# Patient Record
Sex: Male | Born: 1975 | Race: White | Hispanic: No | Marital: Single | Smoking: Current every day smoker
Health system: Southern US, Community
[De-identification: ages and names within clinical notes are randomized; demographics above are authoritative.]

## PROBLEM LIST (undated history)

## (undated) DIAGNOSIS — F419 Anxiety disorder, unspecified: Secondary | ICD-10-CM

## (undated) DIAGNOSIS — F329 Major depressive disorder, single episode, unspecified: Secondary | ICD-10-CM

## (undated) DIAGNOSIS — M797 Fibromyalgia: Secondary | ICD-10-CM

## (undated) DIAGNOSIS — M1990 Unspecified osteoarthritis, unspecified site: Secondary | ICD-10-CM

## (undated) DIAGNOSIS — R55 Syncope and collapse: Secondary | ICD-10-CM

## (undated) DIAGNOSIS — M4802 Spinal stenosis, cervical region: Secondary | ICD-10-CM

## (undated) DIAGNOSIS — R51 Headache: Secondary | ICD-10-CM

## (undated) DIAGNOSIS — K859 Acute pancreatitis without necrosis or infection, unspecified: Secondary | ICD-10-CM

## (undated) DIAGNOSIS — K469 Unspecified abdominal hernia without obstruction or gangrene: Secondary | ICD-10-CM

## (undated) DIAGNOSIS — F32A Depression, unspecified: Secondary | ICD-10-CM

## (undated) DIAGNOSIS — R519 Headache, unspecified: Secondary | ICD-10-CM

## (undated) HISTORY — PX: OTHER SURGICAL HISTORY: SHX169

## (undated) HISTORY — DX: Unspecified abdominal hernia without obstruction or gangrene: K46.9

## (undated) HISTORY — PX: INGUINAL HERNIA REPAIR: SUR1180

---

## 2000-03-17 ENCOUNTER — Encounter: Payer: Self-pay | Admitting: Occupational Medicine

## 2000-03-17 ENCOUNTER — Encounter: Admission: RE | Admit: 2000-03-17 | Discharge: 2000-03-17 | Payer: Self-pay | Admitting: Occupational Medicine

## 2002-10-03 ENCOUNTER — Emergency Department (HOSPITAL_COMMUNITY): Admission: EM | Admit: 2002-10-03 | Discharge: 2002-10-04 | Payer: Self-pay | Admitting: Emergency Medicine

## 2002-10-04 ENCOUNTER — Encounter: Payer: Self-pay | Admitting: Emergency Medicine

## 2003-07-03 ENCOUNTER — Emergency Department (HOSPITAL_COMMUNITY): Admission: EM | Admit: 2003-07-03 | Discharge: 2003-07-04 | Payer: Self-pay | Admitting: Emergency Medicine

## 2003-07-04 ENCOUNTER — Ambulatory Visit (HOSPITAL_COMMUNITY): Admission: RE | Admit: 2003-07-04 | Discharge: 2003-07-04 | Payer: Self-pay | Admitting: Emergency Medicine

## 2003-10-22 ENCOUNTER — Emergency Department (HOSPITAL_COMMUNITY): Admission: EM | Admit: 2003-10-22 | Discharge: 2003-10-23 | Payer: Self-pay | Admitting: Emergency Medicine

## 2003-10-23 ENCOUNTER — Emergency Department (HOSPITAL_COMMUNITY): Admission: EM | Admit: 2003-10-23 | Discharge: 2003-10-24 | Payer: Self-pay | Admitting: Emergency Medicine

## 2003-10-25 ENCOUNTER — Emergency Department (HOSPITAL_COMMUNITY): Admission: EM | Admit: 2003-10-25 | Discharge: 2003-10-25 | Payer: Self-pay | Admitting: Emergency Medicine

## 2003-10-28 ENCOUNTER — Emergency Department (HOSPITAL_COMMUNITY): Admission: EM | Admit: 2003-10-28 | Discharge: 2003-10-29 | Payer: Self-pay | Admitting: Emergency Medicine

## 2003-12-09 ENCOUNTER — Encounter: Admission: RE | Admit: 2003-12-09 | Discharge: 2003-12-09 | Payer: Self-pay | Admitting: Family Medicine

## 2003-12-19 ENCOUNTER — Ambulatory Visit (HOSPITAL_COMMUNITY): Admission: RE | Admit: 2003-12-19 | Discharge: 2003-12-19 | Payer: Self-pay | Admitting: Family Medicine

## 2003-12-22 ENCOUNTER — Emergency Department (HOSPITAL_COMMUNITY): Admission: EM | Admit: 2003-12-22 | Discharge: 2003-12-22 | Payer: Self-pay | Admitting: Emergency Medicine

## 2003-12-30 ENCOUNTER — Encounter: Admission: RE | Admit: 2003-12-30 | Discharge: 2003-12-30 | Payer: Self-pay | Admitting: Family Medicine

## 2004-01-02 ENCOUNTER — Emergency Department (HOSPITAL_COMMUNITY): Admission: EM | Admit: 2004-01-02 | Discharge: 2004-01-03 | Payer: Self-pay | Admitting: *Deleted

## 2004-01-06 ENCOUNTER — Ambulatory Visit (HOSPITAL_COMMUNITY): Admission: RE | Admit: 2004-01-06 | Discharge: 2004-01-06 | Payer: Self-pay | Admitting: Internal Medicine

## 2004-01-11 ENCOUNTER — Ambulatory Visit: Payer: Self-pay | Admitting: Family Medicine

## 2004-03-23 ENCOUNTER — Ambulatory Visit: Payer: Self-pay | Admitting: Family Medicine

## 2004-04-02 ENCOUNTER — Ambulatory Visit: Payer: Self-pay | Admitting: Internal Medicine

## 2004-04-10 ENCOUNTER — Ambulatory Visit: Payer: Self-pay | Admitting: Internal Medicine

## 2004-04-12 ENCOUNTER — Ambulatory Visit (HOSPITAL_COMMUNITY): Admission: RE | Admit: 2004-04-12 | Discharge: 2004-04-12 | Payer: Self-pay | Admitting: Internal Medicine

## 2004-04-23 ENCOUNTER — Ambulatory Visit (HOSPITAL_COMMUNITY): Admission: RE | Admit: 2004-04-23 | Discharge: 2004-04-23 | Payer: Self-pay | Admitting: Internal Medicine

## 2004-04-23 ENCOUNTER — Ambulatory Visit: Payer: Self-pay | Admitting: Internal Medicine

## 2004-06-14 ENCOUNTER — Ambulatory Visit: Payer: Self-pay | Admitting: Family Medicine

## 2004-10-16 ENCOUNTER — Ambulatory Visit: Payer: Self-pay | Admitting: Family Medicine

## 2004-10-22 ENCOUNTER — Ambulatory Visit: Payer: Self-pay | Admitting: Internal Medicine

## 2005-02-12 ENCOUNTER — Ambulatory Visit: Payer: Self-pay | Admitting: Internal Medicine

## 2005-05-24 ENCOUNTER — Ambulatory Visit: Payer: Self-pay | Admitting: Family Medicine

## 2005-06-11 ENCOUNTER — Ambulatory Visit: Payer: Self-pay | Admitting: Family Medicine

## 2005-06-13 ENCOUNTER — Ambulatory Visit: Payer: Self-pay | Admitting: Cardiology

## 2006-01-02 ENCOUNTER — Encounter: Admission: RE | Admit: 2006-01-02 | Discharge: 2006-01-02 | Payer: Self-pay | Admitting: Family Medicine

## 2006-01-02 ENCOUNTER — Ambulatory Visit: Payer: Self-pay | Admitting: Family Medicine

## 2006-01-08 ENCOUNTER — Encounter: Admission: RE | Admit: 2006-01-08 | Discharge: 2006-01-08 | Payer: Self-pay | Admitting: Family Medicine

## 2006-02-11 ENCOUNTER — Ambulatory Visit: Payer: Self-pay | Admitting: Family Medicine

## 2006-03-28 ENCOUNTER — Encounter: Payer: Self-pay | Admitting: Family Medicine

## 2006-06-16 ENCOUNTER — Ambulatory Visit: Payer: Self-pay | Admitting: Family Medicine

## 2006-06-16 LAB — CONVERTED CEMR LAB
BUN: 8 mg/dL (ref 6–23)
Basophils Absolute: 0 10*3/uL (ref 0.0–0.1)
Basophils Relative: 0.4 % (ref 0.0–1.0)
CO2: 33 meq/L — ABNORMAL HIGH (ref 19–32)
Creatinine, Ser: 1 mg/dL (ref 0.4–1.5)
Free T4: 0.8 ng/dL (ref 0.6–1.6)
GFR calc Af Amer: 114 mL/min
GFR calc non Af Amer: 94 mL/min
MCHC: 33.9 g/dL (ref 30.0–36.0)
Neutrophils Relative %: 53 % (ref 43.0–77.0)
Platelets: 203 10*3/uL (ref 150–400)
RBC: 5.06 M/uL (ref 4.22–5.81)
RDW: 11.9 % (ref 11.5–14.6)
Sodium: 143 meq/L (ref 135–145)
WBC: 6.3 10*3/uL (ref 4.5–10.5)

## 2006-07-01 DIAGNOSIS — Z8669 Personal history of other diseases of the nervous system and sense organs: Secondary | ICD-10-CM

## 2006-08-12 ENCOUNTER — Ambulatory Visit: Payer: Self-pay | Admitting: Family Medicine

## 2006-08-12 ENCOUNTER — Encounter: Admission: RE | Admit: 2006-08-12 | Discharge: 2006-08-12 | Payer: Self-pay | Admitting: Family Medicine

## 2006-08-12 DIAGNOSIS — R5383 Other fatigue: Secondary | ICD-10-CM

## 2006-08-12 DIAGNOSIS — R059 Cough, unspecified: Secondary | ICD-10-CM | POA: Insufficient documentation

## 2006-08-12 DIAGNOSIS — R05 Cough: Secondary | ICD-10-CM

## 2006-08-12 DIAGNOSIS — R5381 Other malaise: Secondary | ICD-10-CM

## 2006-08-12 DIAGNOSIS — F329 Major depressive disorder, single episode, unspecified: Secondary | ICD-10-CM

## 2006-08-12 DIAGNOSIS — F32A Depression, unspecified: Secondary | ICD-10-CM | POA: Insufficient documentation

## 2006-08-13 ENCOUNTER — Encounter (INDEPENDENT_AMBULATORY_CARE_PROVIDER_SITE_OTHER): Payer: Self-pay | Admitting: *Deleted

## 2006-08-13 LAB — CONVERTED CEMR LAB
Basophils Absolute: 0 10*3/uL (ref 0.0–0.1)
Chloride: 106 meq/L (ref 96–112)
Eosinophils Absolute: 0.2 10*3/uL (ref 0.0–0.6)
Eosinophils Relative: 2.4 % (ref 0.0–5.0)
HCT: 46.6 % (ref 39.0–52.0)
Hemoglobin: 16.1 g/dL (ref 13.0–17.0)
MCV: 90.7 fL (ref 78.0–100.0)
Neutro Abs: 3.4 10*3/uL (ref 1.4–7.7)
Neutrophils Relative %: 50.1 % (ref 43.0–77.0)
Platelets: 237 10*3/uL (ref 150–400)
Potassium: 4.2 meq/L (ref 3.5–5.1)
Sodium: 141 meq/L (ref 135–145)
Testosterone: 699.83 ng/dL (ref 350.00–890)

## 2006-09-25 ENCOUNTER — Encounter: Payer: Self-pay | Admitting: Family Medicine

## 2006-11-24 ENCOUNTER — Encounter: Payer: Self-pay | Admitting: Family Medicine

## 2007-04-10 ENCOUNTER — Ambulatory Visit: Payer: Self-pay | Admitting: Family Medicine

## 2007-04-10 DIAGNOSIS — F1921 Other psychoactive substance dependence, in remission: Secondary | ICD-10-CM

## 2007-07-10 ENCOUNTER — Emergency Department (HOSPITAL_COMMUNITY): Admission: EM | Admit: 2007-07-10 | Discharge: 2007-07-11 | Payer: Self-pay | Admitting: Emergency Medicine

## 2007-08-10 ENCOUNTER — Emergency Department (HOSPITAL_COMMUNITY): Admission: EM | Admit: 2007-08-10 | Discharge: 2007-08-10 | Payer: Self-pay | Admitting: Emergency Medicine

## 2007-08-30 ENCOUNTER — Emergency Department (HOSPITAL_COMMUNITY): Admission: EM | Admit: 2007-08-30 | Discharge: 2007-08-30 | Payer: Self-pay | Admitting: Emergency Medicine

## 2007-09-01 ENCOUNTER — Emergency Department (HOSPITAL_COMMUNITY): Admission: EM | Admit: 2007-09-01 | Discharge: 2007-09-01 | Payer: Self-pay | Admitting: Emergency Medicine

## 2007-11-08 ENCOUNTER — Emergency Department (HOSPITAL_COMMUNITY): Admission: EM | Admit: 2007-11-08 | Discharge: 2007-11-08 | Payer: Self-pay | Admitting: Emergency Medicine

## 2007-11-19 ENCOUNTER — Ambulatory Visit: Payer: Self-pay | Admitting: Family Medicine

## 2007-11-19 DIAGNOSIS — F411 Generalized anxiety disorder: Secondary | ICD-10-CM | POA: Insufficient documentation

## 2007-11-19 DIAGNOSIS — M542 Cervicalgia: Secondary | ICD-10-CM

## 2007-11-30 ENCOUNTER — Telehealth (INDEPENDENT_AMBULATORY_CARE_PROVIDER_SITE_OTHER): Payer: Self-pay | Admitting: *Deleted

## 2007-12-07 ENCOUNTER — Telehealth (INDEPENDENT_AMBULATORY_CARE_PROVIDER_SITE_OTHER): Payer: Self-pay | Admitting: *Deleted

## 2007-12-09 ENCOUNTER — Telehealth: Payer: Self-pay | Admitting: Family Medicine

## 2008-01-18 ENCOUNTER — Telehealth: Payer: Self-pay | Admitting: Family Medicine

## 2008-01-27 ENCOUNTER — Telehealth: Payer: Self-pay | Admitting: Family Medicine

## 2008-01-27 ENCOUNTER — Ambulatory Visit: Payer: Self-pay | Admitting: Family Medicine

## 2008-02-22 ENCOUNTER — Telehealth (INDEPENDENT_AMBULATORY_CARE_PROVIDER_SITE_OTHER): Payer: Self-pay | Admitting: *Deleted

## 2008-03-09 ENCOUNTER — Ambulatory Visit: Payer: Self-pay | Admitting: Family Medicine

## 2008-03-09 ENCOUNTER — Encounter: Payer: Self-pay | Admitting: Family Medicine

## 2008-03-09 DIAGNOSIS — M279 Disease of jaws, unspecified: Secondary | ICD-10-CM | POA: Insufficient documentation

## 2008-03-10 ENCOUNTER — Telehealth: Payer: Self-pay | Admitting: Family Medicine

## 2008-03-31 ENCOUNTER — Telehealth: Payer: Self-pay | Admitting: Family Medicine

## 2008-04-21 ENCOUNTER — Ambulatory Visit: Payer: Self-pay | Admitting: Family Medicine

## 2008-04-21 ENCOUNTER — Telehealth (INDEPENDENT_AMBULATORY_CARE_PROVIDER_SITE_OTHER): Payer: Self-pay | Admitting: *Deleted

## 2008-04-28 ENCOUNTER — Telehealth: Payer: Self-pay | Admitting: Family Medicine

## 2008-05-02 ENCOUNTER — Ambulatory Visit: Payer: Self-pay | Admitting: Family Medicine

## 2008-05-02 DIAGNOSIS — M4802 Spinal stenosis, cervical region: Secondary | ICD-10-CM

## 2008-05-06 ENCOUNTER — Telehealth (INDEPENDENT_AMBULATORY_CARE_PROVIDER_SITE_OTHER): Payer: Self-pay | Admitting: *Deleted

## 2008-05-26 ENCOUNTER — Emergency Department (HOSPITAL_COMMUNITY): Admission: EM | Admit: 2008-05-26 | Discharge: 2008-05-26 | Payer: Self-pay | Admitting: Emergency Medicine

## 2008-05-26 ENCOUNTER — Telehealth (INDEPENDENT_AMBULATORY_CARE_PROVIDER_SITE_OTHER): Payer: Self-pay | Admitting: *Deleted

## 2008-06-07 ENCOUNTER — Telehealth (INDEPENDENT_AMBULATORY_CARE_PROVIDER_SITE_OTHER): Payer: Self-pay | Admitting: *Deleted

## 2008-06-20 ENCOUNTER — Telehealth: Payer: Self-pay | Admitting: Family Medicine

## 2008-06-29 ENCOUNTER — Ambulatory Visit: Payer: Self-pay | Admitting: Family Medicine

## 2008-07-13 ENCOUNTER — Telehealth (INDEPENDENT_AMBULATORY_CARE_PROVIDER_SITE_OTHER): Payer: Self-pay | Admitting: *Deleted

## 2008-07-25 ENCOUNTER — Telehealth (INDEPENDENT_AMBULATORY_CARE_PROVIDER_SITE_OTHER): Payer: Self-pay | Admitting: *Deleted

## 2008-08-17 ENCOUNTER — Ambulatory Visit: Payer: Self-pay | Admitting: Family Medicine

## 2008-08-17 ENCOUNTER — Telehealth (INDEPENDENT_AMBULATORY_CARE_PROVIDER_SITE_OTHER): Payer: Self-pay | Admitting: *Deleted

## 2008-08-18 ENCOUNTER — Telehealth (INDEPENDENT_AMBULATORY_CARE_PROVIDER_SITE_OTHER): Payer: Self-pay | Admitting: *Deleted

## 2008-09-09 ENCOUNTER — Emergency Department: Payer: Self-pay | Admitting: Emergency Medicine

## 2008-09-16 ENCOUNTER — Telehealth (INDEPENDENT_AMBULATORY_CARE_PROVIDER_SITE_OTHER): Payer: Self-pay | Admitting: *Deleted

## 2008-10-03 ENCOUNTER — Telehealth: Payer: Self-pay | Admitting: Family Medicine

## 2008-10-05 ENCOUNTER — Telehealth: Payer: Self-pay | Admitting: Family Medicine

## 2008-10-07 ENCOUNTER — Ambulatory Visit: Payer: Self-pay | Admitting: Family Medicine

## 2008-10-07 DIAGNOSIS — N509 Disorder of male genital organs, unspecified: Secondary | ICD-10-CM | POA: Insufficient documentation

## 2008-10-07 DIAGNOSIS — R3911 Hesitancy of micturition: Secondary | ICD-10-CM

## 2008-10-07 LAB — CONVERTED CEMR LAB
Bilirubin Urine: NEGATIVE
Glucose, Urine, Semiquant: NEGATIVE
Nitrite: NEGATIVE
Protein, U semiquant: NEGATIVE
pH: 5

## 2008-10-08 ENCOUNTER — Encounter: Payer: Self-pay | Admitting: Family Medicine

## 2008-10-09 LAB — CONVERTED CEMR LAB: Chlamydia, Swab/Urine, PCR: NEGATIVE

## 2008-10-10 ENCOUNTER — Encounter: Payer: Self-pay | Admitting: Family Medicine

## 2008-10-11 ENCOUNTER — Telehealth: Payer: Self-pay | Admitting: Family Medicine

## 2008-10-11 DIAGNOSIS — R109 Unspecified abdominal pain: Secondary | ICD-10-CM

## 2008-10-14 ENCOUNTER — Ambulatory Visit: Payer: Self-pay | Admitting: Cardiology

## 2008-10-14 ENCOUNTER — Telehealth (INDEPENDENT_AMBULATORY_CARE_PROVIDER_SITE_OTHER): Payer: Self-pay | Admitting: *Deleted

## 2008-10-15 ENCOUNTER — Emergency Department: Payer: Self-pay | Admitting: Internal Medicine

## 2008-10-18 ENCOUNTER — Telehealth (INDEPENDENT_AMBULATORY_CARE_PROVIDER_SITE_OTHER): Payer: Self-pay | Admitting: *Deleted

## 2008-10-20 ENCOUNTER — Telehealth (INDEPENDENT_AMBULATORY_CARE_PROVIDER_SITE_OTHER): Payer: Self-pay | Admitting: *Deleted

## 2008-10-25 ENCOUNTER — Telehealth (INDEPENDENT_AMBULATORY_CARE_PROVIDER_SITE_OTHER): Payer: Self-pay | Admitting: *Deleted

## 2008-11-02 ENCOUNTER — Ambulatory Visit: Payer: Self-pay | Admitting: Family Medicine

## 2008-11-02 DIAGNOSIS — Z8619 Personal history of other infectious and parasitic diseases: Secondary | ICD-10-CM

## 2008-11-03 LAB — CONVERTED CEMR LAB
Eosinophils Absolute: 0.3 10*3/uL (ref 0.0–0.7)
Eosinophils Relative: 3.4 % (ref 0.0–5.0)
HCT: 45.4 % (ref 39.0–52.0)
Lymphocytes Relative: 42 % (ref 12.0–46.0)
Lymphs Abs: 3.3 10*3/uL (ref 0.7–4.0)
MCHC: 34.9 g/dL (ref 30.0–36.0)
Monocytes Absolute: 0.7 10*3/uL (ref 0.1–1.0)
Neutrophils Relative %: 45.9 % (ref 43.0–77.0)
RBC: 5.01 M/uL (ref 4.22–5.81)
RDW: 12.3 % (ref 11.5–14.6)

## 2008-11-04 ENCOUNTER — Encounter (INDEPENDENT_AMBULATORY_CARE_PROVIDER_SITE_OTHER): Payer: Self-pay | Admitting: *Deleted

## 2008-11-09 ENCOUNTER — Telehealth: Payer: Self-pay | Admitting: Family Medicine

## 2008-11-12 ENCOUNTER — Encounter: Payer: Self-pay | Admitting: Family Medicine

## 2008-11-13 ENCOUNTER — Encounter: Payer: Self-pay | Admitting: Family Medicine

## 2008-11-15 ENCOUNTER — Telehealth (INDEPENDENT_AMBULATORY_CARE_PROVIDER_SITE_OTHER): Payer: Self-pay | Admitting: *Deleted

## 2008-11-15 ENCOUNTER — Telehealth: Payer: Self-pay | Admitting: Family Medicine

## 2008-12-01 ENCOUNTER — Telehealth: Payer: Self-pay | Admitting: Family Medicine

## 2008-12-05 ENCOUNTER — Telehealth: Payer: Self-pay | Admitting: Family Medicine

## 2008-12-12 ENCOUNTER — Encounter: Payer: Self-pay | Admitting: Family Medicine

## 2008-12-26 ENCOUNTER — Ambulatory Visit: Payer: Self-pay | Admitting: Family Medicine

## 2009-01-23 ENCOUNTER — Telehealth: Payer: Self-pay | Admitting: Family Medicine

## 2009-02-07 ENCOUNTER — Emergency Department (HOSPITAL_COMMUNITY): Admission: EM | Admit: 2009-02-07 | Discharge: 2009-02-07 | Payer: Self-pay | Admitting: Emergency Medicine

## 2009-02-07 ENCOUNTER — Ambulatory Visit: Payer: Self-pay | Admitting: Family Medicine

## 2009-02-07 DIAGNOSIS — S61409A Unspecified open wound of unspecified hand, initial encounter: Secondary | ICD-10-CM | POA: Insufficient documentation

## 2009-02-08 ENCOUNTER — Telehealth (INDEPENDENT_AMBULATORY_CARE_PROVIDER_SITE_OTHER): Payer: Self-pay | Admitting: *Deleted

## 2009-02-09 ENCOUNTER — Encounter: Payer: Self-pay | Admitting: Family Medicine

## 2009-02-10 ENCOUNTER — Telehealth: Payer: Self-pay | Admitting: Family Medicine

## 2009-02-13 ENCOUNTER — Ambulatory Visit (HOSPITAL_COMMUNITY): Admission: RE | Admit: 2009-02-13 | Discharge: 2009-02-13 | Payer: Self-pay | Admitting: Orthopedic Surgery

## 2009-02-15 ENCOUNTER — Telehealth: Payer: Self-pay | Admitting: Family Medicine

## 2009-02-16 ENCOUNTER — Telehealth (INDEPENDENT_AMBULATORY_CARE_PROVIDER_SITE_OTHER): Payer: Self-pay | Admitting: *Deleted

## 2009-02-21 ENCOUNTER — Encounter: Admission: RE | Admit: 2009-02-21 | Discharge: 2009-03-08 | Payer: Self-pay | Admitting: Orthopedic Surgery

## 2009-03-06 ENCOUNTER — Ambulatory Visit: Payer: Self-pay | Admitting: Internal Medicine

## 2009-03-13 ENCOUNTER — Ambulatory Visit: Payer: Self-pay | Admitting: Family Medicine

## 2009-03-13 DIAGNOSIS — F17200 Nicotine dependence, unspecified, uncomplicated: Secondary | ICD-10-CM

## 2009-03-14 ENCOUNTER — Encounter: Payer: Self-pay | Admitting: Family Medicine

## 2009-03-14 ENCOUNTER — Telehealth: Payer: Self-pay | Admitting: Family Medicine

## 2009-03-27 ENCOUNTER — Telehealth: Payer: Self-pay | Admitting: Family Medicine

## 2009-03-29 ENCOUNTER — Ambulatory Visit: Payer: Self-pay | Admitting: Family Medicine

## 2009-03-29 ENCOUNTER — Telehealth: Payer: Self-pay | Admitting: Family Medicine

## 2009-04-07 ENCOUNTER — Telehealth (INDEPENDENT_AMBULATORY_CARE_PROVIDER_SITE_OTHER): Payer: Self-pay | Admitting: *Deleted

## 2009-04-09 ENCOUNTER — Emergency Department (HOSPITAL_COMMUNITY): Admission: EM | Admit: 2009-04-09 | Discharge: 2009-04-09 | Payer: Self-pay | Admitting: Emergency Medicine

## 2009-04-11 ENCOUNTER — Telehealth: Payer: Self-pay | Admitting: Family Medicine

## 2009-04-21 ENCOUNTER — Emergency Department (HOSPITAL_COMMUNITY): Admission: EM | Admit: 2009-04-21 | Discharge: 2009-04-21 | Payer: Self-pay | Admitting: Family Medicine

## 2009-04-23 ENCOUNTER — Emergency Department (HOSPITAL_COMMUNITY): Admission: EM | Admit: 2009-04-23 | Discharge: 2009-04-23 | Payer: Self-pay | Admitting: Emergency Medicine

## 2009-04-25 ENCOUNTER — Ambulatory Visit: Payer: Self-pay | Admitting: Family Medicine

## 2009-05-05 ENCOUNTER — Telehealth: Payer: Self-pay | Admitting: Family Medicine

## 2009-05-08 ENCOUNTER — Ambulatory Visit: Payer: Self-pay | Admitting: Family Medicine

## 2009-05-08 ENCOUNTER — Telehealth: Payer: Self-pay | Admitting: Family Medicine

## 2009-05-08 DIAGNOSIS — Z9119 Patient's noncompliance with other medical treatment and regimen: Secondary | ICD-10-CM

## 2009-05-09 ENCOUNTER — Telehealth: Payer: Self-pay | Admitting: Family Medicine

## 2009-05-10 ENCOUNTER — Telehealth: Payer: Self-pay | Admitting: Family Medicine

## 2009-05-11 ENCOUNTER — Encounter: Payer: Self-pay | Admitting: Family Medicine

## 2009-05-22 ENCOUNTER — Telehealth: Payer: Self-pay | Admitting: Family Medicine

## 2009-06-02 ENCOUNTER — Telehealth (INDEPENDENT_AMBULATORY_CARE_PROVIDER_SITE_OTHER): Payer: Self-pay | Admitting: *Deleted

## 2009-06-05 ENCOUNTER — Telehealth: Payer: Self-pay | Admitting: Family Medicine

## 2009-06-05 ENCOUNTER — Telehealth (INDEPENDENT_AMBULATORY_CARE_PROVIDER_SITE_OTHER): Payer: Self-pay | Admitting: *Deleted

## 2009-06-05 ENCOUNTER — Encounter: Payer: Self-pay | Admitting: Family Medicine

## 2009-06-08 ENCOUNTER — Emergency Department (HOSPITAL_COMMUNITY): Admission: EM | Admit: 2009-06-08 | Discharge: 2009-06-08 | Payer: Self-pay | Admitting: Family Medicine

## 2009-06-09 ENCOUNTER — Emergency Department: Payer: Self-pay | Admitting: Emergency Medicine

## 2009-06-19 ENCOUNTER — Emergency Department: Payer: Self-pay | Admitting: Emergency Medicine

## 2009-07-03 ENCOUNTER — Emergency Department (HOSPITAL_COMMUNITY): Admission: EM | Admit: 2009-07-03 | Discharge: 2009-07-03 | Payer: Self-pay | Admitting: Emergency Medicine

## 2009-07-07 ENCOUNTER — Emergency Department (HOSPITAL_COMMUNITY): Admission: EM | Admit: 2009-07-07 | Discharge: 2009-07-07 | Payer: Self-pay | Admitting: Emergency Medicine

## 2009-09-04 ENCOUNTER — Emergency Department: Payer: Self-pay | Admitting: Emergency Medicine

## 2009-09-18 ENCOUNTER — Emergency Department: Payer: Self-pay | Admitting: Emergency Medicine

## 2009-09-24 ENCOUNTER — Emergency Department: Payer: Self-pay | Admitting: Emergency Medicine

## 2009-09-28 ENCOUNTER — Emergency Department: Payer: Self-pay | Admitting: Emergency Medicine

## 2009-10-25 ENCOUNTER — Emergency Department: Payer: Self-pay | Admitting: Emergency Medicine

## 2009-11-16 ENCOUNTER — Emergency Department: Payer: Self-pay | Admitting: Emergency Medicine

## 2009-11-17 ENCOUNTER — Emergency Department (HOSPITAL_COMMUNITY)
Admission: EM | Admit: 2009-11-17 | Discharge: 2009-11-17 | Payer: Self-pay | Source: Home / Self Care | Admitting: Emergency Medicine

## 2009-11-26 ENCOUNTER — Emergency Department: Payer: Self-pay | Admitting: Emergency Medicine

## 2009-12-10 ENCOUNTER — Emergency Department: Payer: Self-pay | Admitting: Emergency Medicine

## 2009-12-23 ENCOUNTER — Emergency Department: Payer: Self-pay | Admitting: Unknown Physician Specialty

## 2009-12-25 ENCOUNTER — Emergency Department: Payer: Self-pay | Admitting: Emergency Medicine

## 2010-02-07 ENCOUNTER — Emergency Department: Payer: Self-pay | Admitting: Emergency Medicine

## 2010-03-19 ENCOUNTER — Emergency Department (HOSPITAL_COMMUNITY)
Admission: EM | Admit: 2010-03-19 | Discharge: 2010-03-19 | Payer: Self-pay | Source: Home / Self Care | Admitting: Emergency Medicine

## 2010-03-26 LAB — COMPREHENSIVE METABOLIC PANEL
ALT: 15 U/L (ref 0–53)
AST: 16 U/L (ref 0–37)
Albumin: 3.8 g/dL (ref 3.5–5.2)
Alkaline Phosphatase: 103 U/L (ref 39–117)
BUN: 7 mg/dL (ref 6–23)
CO2: 28 mEq/L (ref 19–32)
Calcium: 9.5 mg/dL (ref 8.4–10.5)
Chloride: 103 mEq/L (ref 96–112)
Creatinine, Ser: 0.9 mg/dL (ref 0.4–1.5)
GFR calc Af Amer: >60 mL/min (ref >60)
GFR calc non Af Amer: >60 mL/min (ref >60)
Glucose, Bld: 97 mg/dL (ref 70–99)
Potassium: 4.3 mEq/L (ref 3.5–5.1)
Sodium: 138 mEq/L (ref 135–145)
Total Bilirubin: 0.4 mg/dL (ref 0.3–1.2)
Total Protein: 6.6 g/dL (ref 6.0–8.3)

## 2010-03-26 LAB — CBC
HCT: 46 % (ref 39.0–52.0)
Hemoglobin: 15.9 g/dL (ref 13.0–17.0)
MCH: 31.9 pg (ref 26.0–34.0)
MCHC: 34.6 g/dL (ref 30.0–36.0)
MCV: 92.4 fL (ref 78.0–100.0)
Platelets: 331 10*3/uL (ref 150–400)
RBC: 4.98 MIL/uL (ref 4.22–5.81)
RDW: 12.7 % (ref 11.5–15.5)
WBC: 10.1 10*3/uL (ref 4.0–10.5)

## 2010-03-26 LAB — DIFFERENTIAL
Basophils Absolute: 0 10*3/uL (ref 0.0–0.1)
Basophils Relative: 0 % (ref 0–1)
Eosinophils Absolute: 0.2 10*3/uL (ref 0.0–0.7)
Eosinophils Relative: 2 % (ref 0–5)
Lymphocytes Relative: 41 % (ref 12–46)
Lymphs Abs: 4.1 10*3/uL — ABNORMAL HIGH (ref 0.7–4.0)
Monocytes Absolute: 0.8 10*3/uL (ref 0.1–1.0)
Monocytes Relative: 8 % (ref 3–12)
Neutro Abs: 4.9 10*3/uL (ref 1.7–7.7)
Neutrophils Relative %: 49 % (ref 43–77)

## 2010-03-26 LAB — SURGICAL PCR SCREEN
MRSA, PCR: NEGATIVE
Staphylococcus aureus: NEGATIVE

## 2010-03-27 ENCOUNTER — Ambulatory Visit (HOSPITAL_COMMUNITY)
Admission: RE | Admit: 2010-03-27 | Discharge: 2010-03-27 | Payer: Self-pay | Source: Home / Self Care | Attending: Surgery | Admitting: Surgery

## 2010-03-31 ENCOUNTER — Encounter: Payer: Self-pay | Admitting: Family Medicine

## 2010-04-06 ENCOUNTER — Emergency Department (HOSPITAL_COMMUNITY)
Admission: EM | Admit: 2010-04-06 | Discharge: 2010-04-06 | Payer: Self-pay | Source: Home / Self Care | Admitting: Emergency Medicine

## 2010-04-06 LAB — CBC
MCH: 31.4 pg (ref 26.0–34.0)
MCHC: 36.2 g/dL — ABNORMAL HIGH (ref 30.0–36.0)
MCV: 86.9 fL (ref 78.0–100.0)
Platelets: 240 10*3/uL (ref 150–400)
RBC: 5.28 MIL/uL (ref 4.22–5.81)

## 2010-04-06 LAB — RAPID URINE DRUG SCREEN, HOSP PERFORMED
Barbiturates: NOT DETECTED
Benzodiazepines: POSITIVE — AB
Cocaine: NOT DETECTED
Opiates: POSITIVE — AB

## 2010-04-06 LAB — URINE MICROSCOPIC-ADD ON

## 2010-04-06 LAB — BASIC METABOLIC PANEL
BUN: 7 mg/dL (ref 6–23)
CO2: 24 mEq/L (ref 19–32)
Creatinine, Ser: 0.98 mg/dL (ref 0.4–1.5)
GFR calc non Af Amer: >60 mL/min (ref >60)
Glucose, Bld: 105 mg/dL — ABNORMAL HIGH (ref 70–99)
Potassium: 3.9 mEq/L (ref 3.5–5.1)

## 2010-04-06 LAB — URINALYSIS, ROUTINE W REFLEX MICROSCOPIC
Bilirubin Urine: NEGATIVE
Specific Gravity, Urine: 1.023 (ref 1.005–1.030)
Urine Glucose, Fasting: NEGATIVE mg/dL
Urobilinogen, UA: 0.2 mg/dL (ref 0.0–1.0)

## 2010-04-06 LAB — DIFFERENTIAL
Eosinophils Relative: 1 % (ref 0–5)
Lymphs Abs: 1.7 10*3/uL (ref 0.7–4.0)
Monocytes Absolute: 1.2 10*3/uL — ABNORMAL HIGH (ref 0.1–1.0)

## 2010-04-06 LAB — ETHANOL: Alcohol, Ethyl (B): <5 mg/dL (ref 0–10)

## 2010-04-12 NOTE — Progress Notes (Signed)
Summary: Phone  Phone Note Call from Patient Call back at Home Phone [phone]   Caller: Patient Summary of Call: Patient is requesting a referral letter fax to a lawyer stating how long she has seen the patient and what he has been seen for. Why she cant see him. Vickki Hearing WUX [phone] Initial call taken by: Barb Merino,  May 10, 2009 10:36 AM  Follow-up for Phone Call        I need to know when he started coming here---it was before EMR Follow-up by: Loreen Freud DO,  May 10, 2009 12:16 PM  Additional Follow-up for Phone Call Additional follow up Details #1::        first time you saw him was 12/08/03. Army Fossa CMA  May 10, 2009 1:29 PM     Additional Follow-up for Phone Call Additional follow up Details #2::    LETTER DONE Follow-up by: Loreen Freud DO,  May 11, 2009 10:41 AM  Additional Follow-up for Phone Call Additional follow up Details #3:: Details for Additional Follow-up Action Taken: faxed to lawyer.Army Fossa CMA  May 11, 2009 11:12 AM

## 2010-04-12 NOTE — Assessment & Plan Note (Signed)
Summary: pain in neck/kdc   Vital Signs:  Patient profile:   35 year old male Height:      67 inches Weight:      157 pounds BMI:     24.68 Temp:     98.5 degrees F oral Pulse rate:   98 / minute Pulse rhythm:   regular BP sitting:   124 / 80  (left arm) Cuff size:   large  Vitals Entered By: Army Fossa CMA (March 29, 2009 10:11 AM) CC: Pt here c/o fell on the ice 03/21/09 and his left side of neck is very sore cannot turn neck, had a bad headache 2-3 days after fall.    History of Present Illness:  Injury      This is a 35 year old man who presents with An injury.  Pt fell on ice 03/21/2009 and hurt  head and neck.  He had a bad headache for 2-3 days but now is gone ---pt still has pain in back and neck.   Pt has numbness in hand but that is not new.  He can not move neck.  The patient reports injury to the head and neck.  The patient also reports tenderness and numbness.  The patient denies the following risk factors for significant bleeding: aspirin use, anticoagulant use, and history of bleeding disorder.  Because of pain pt was taking percocet 1 1/2 tab s at a time.  Current Medications (verified): 1)  Percocet 10-325 Mg Tabs (Oxycodone-Acetaminophen) .Marland Kitchen.. 1 By Mouth Every 6 Hours As Needed 2)  Xanax 1 Mg Tabs (Alprazolam) .Marland Kitchen.. 1 By Mouth Three Times A Day As Needed 3)  Dilaudid 8 Mg Tabs (Hydromorphone Hcl) .Marland Kitchen.. 1 By Mouth Q6h As Needed  Allergies (verified): No Known Drug Allergies  Past History:  Past Medical History: Last updated: 01/27/2008 Depression Anxiety cervical stenosis   Past Surgical History: Last updated: 01/27/2008 Denies surgical history  Family History: Last updated: 01/27/2008 Family History of CAD Male 1st degree relative <60 Family History of CAD Male 1st degree relative <50 Family History Hypertension parkinsons disease MS A fib  Family History of Colon CA  F--MI x2 Bipolar on both sides of family  Social History: Last  updated: 11/19/2007 unemployed Single Current Smoker Regular exercise-no  Risk Factors: Exercise: no (11/19/2007)  Risk Factors: Smoking Status: current (11/19/2007) Packs/Day: 2.5 (08/12/2006)  Family History: Reviewed history from 01/27/2008 and no changes required. Family History of CAD Male 1st degree relative <60 Family History of CAD Male 1st degree relative <50 Family History Hypertension parkinsons disease MS A fib  Family History of Colon CA  F--MI x2 Bipolar on both sides of family  Social History: Reviewed history from 11/19/2007 and no changes required. unemployed Single Current Smoker Regular exercise-no  Review of Systems      See HPI  Physical Exam  General:  Well-developed,well-nourished,in no acute distress; alert,appropriate and cooperative throughout examination Eyes:  pupils equal, pupils round, and pupils reactive to light.   Ears:  External ear exam shows no significant lesions or deformities.  Otoscopic examination reveals clear canals, tympanic membranes are intact bilaterally without bulging, retraction, inflammation or discharge. Hearing is grossly normal bilaterally. Neck:  decreased ROM.  Pt with pain with flexion and ext and turning to left  Msk:  weakness in L arm and hand---not new Neurologic:  alert & oriented X3 and cranial nerves II-XII intact.   Psych:  Oriented X3 and normally interactive.     Impression & Recommendations:  Problem # 1:  NECK PAIN, LEFT (ICD-723.1)  Pt warned not to take any more meds then written on rx---- if it occurs again --he will receive NO More meds--- he is aware pain management pending His updated medication list for this problem includes:    Percocet 10-325 Mg Tabs (Oxycodone-acetaminophen) .Marland Kitchen... 1 by mouth every 6 hours as needed    Dilaudid 8 Mg Tabs (Hydromorphone hcl) .Marland Kitchen... 1 by mouth q6h as needed  Orders: T-Cervical Spine Comp w/Flex & Ext (57846NG)  Discussed exercises and use of moist  heat or cold and medication.   Complete Medication List: 1)  Percocet 10-325 Mg Tabs (Oxycodone-acetaminophen) .Marland Kitchen.. 1 by mouth every 6 hours as needed 2)  Xanax 1 Mg Tabs (Alprazolam) .Marland Kitchen.. 1 by mouth three times a day as needed 3)  Dilaudid 8 Mg Tabs (Hydromorphone hcl) .Marland Kitchen.. 1 by mouth q6h as needed Prescriptions: DILAUDID 8 MG TABS (HYDROMORPHONE HCL) 1 by mouth q6h as needed  #56 x 0   Entered and Authorized by:   Loreen Freud DO   Signed by:   Loreen Freud DO on 03/29/2009   Method used:   Print then Give to Patient   RxID:   [id number] XANAX 1 MG TABS (ALPRAZOLAM) 1 by mouth three times a day as needed  #120 x 0   Entered and Authorized by:   Loreen Freud DO   Signed by:   Loreen Freud DO on 03/29/2009   Method used:   Print then Give to Patient   RxID:   [id number]
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
## 2010-04-12 NOTE — Letter (Signed)
Summary: Unable to Schedule Eval/[facility]  Unable to Schedule Eval/[facility]   Imported By: Lanelle Bal 03/20/2009 08:47:30  _____________________________________________________________________  External Attachment:    Type:   Image     Comment:   External Document

## 2010-04-12 NOTE — Progress Notes (Signed)
  Mailed out ROI to pt today  [facility]  June 02, 2009 8:19 AM

## 2010-04-12 NOTE — Progress Notes (Signed)
Summary: FAILED PAIN MGMT REFERRAL FAX REQUEST  Phone Note Call from Patient   Caller: Patient Call For: Loreen Freud DO Summary of Call: PATIENT CALLING, REQUESTING WE MAIL HIM A COPY OF THE FAX FROM [facility], THAT STATES THEY ARE "UNABLE TO SCHEDULE PATIENT" .  I WALKED OVER & S/W DR. Laury Axon, WHO STATES IT IS OK TO MAIL THIS PATIENT.  THIS FAX IS SCANNED INTO EMR & DATED 03-14-2009. Initial call taken by: Magdalen Spatz [facility],  March 27, 2009 4:38 PM

## 2010-04-12 NOTE — Progress Notes (Signed)
°  Phone Note Outgoing Call Call back at [facility] Phone [phone]   Call placed by: Ardyth Man,  April 21, 2008 9:00 AM Call placed to: Patient Summary of Call: Faxed copies of everything from [facility] and Dr. Ernst Spell notes to Vania Rea, Disability worker for patient and gave patient a copy to mail. Ardyth Man  April 21, 2008 9:01 AM

## 2010-04-12 NOTE — Letter (Signed)
Summary: Discharge Letter  [facility] [place]  [address] [place], [place] [address]   Phone: [phone]  Fax: [phone]       05/08/2009 MRN: [id number]  [facility] [address] [place], [place]  [address]  Dear Mr. QUEVEDO,   I find it necessary to inform you that I will not be able to provide medical care to you, because of your prescription drug dependance and noncompliance with the pain medication contract.  Since your condition requires medical attention, I suggest that you place your self under the care of another physician without delay. If you desire, I will be available for emergency care for 30 days after you receive this letter.  This should give you ample time to select a physician of your choice from the many competent providers in this area. You may want to call the local medical society or [facility]'s physician referral service [phone]) for their assistance in locating a new physician. With your written authorization, I will make a copy of your medical record available to your new physician.   Sincerely,    Loreen Freud DO  Appended Document: Discharge Letter IDX and EMR updated to reflect dismissal. Letter sent out by certified mail.  Appended Document: Discharge Letter Received receipt from [employer] verifying delivery of letter.
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
## 2010-04-12 NOTE — Progress Notes (Signed)
Summary: pain meds  Phone Note Refill Request   Refills Requested: Medication #1:  PERCOCET 10-325 MG TABS 1 by mouth every 6 hours as needed   Last Refilled: 03/13/2009  Follow-up for Phone Call        did not fill the percocet below. Instructed Pt that we could not fill anything until next week. His dilaudid should have last him for 14 days it was prescribed to him on 03/29/09 and she gave him 56 pills. Army Fossa CMA  April 07, 2009 11:32 AM  Pt is aware and understood. Army Fossa CMA  April 07, 2009 11:32 AM     Prescriptions: PERCOCET 10-325 MG TABS (OXYCODONE-ACETAMINOPHEN) 1 by mouth every 6 hours as needed  #120 x 0   Entered by:   Army Fossa CMA   Authorized by:   Loreen Freud DO   Signed by:   Army Fossa CMA on 04/07/2009   Method used:   Print then Give to Patient   RxID:   [id number]

## 2010-04-12 NOTE — Letter (Signed)
Summary: Generic Letter  [facility] [place]  [address] [place], [place] [address]   Phone: [phone]  Fax: [phone]    06/05/2009  RE :Brent Morales [address] [place], [place]  [address]  To Whom It May Concern:  Brent Morales's condition and pain is worsening because of the delay in his disability. He is no longer a patient of [facility] for reasons discussed in a previous letter.  I have recommended [facility] to the patient.  I felt they would be able to help him with getting into a specialist-- at least until his disability comes through. Please feel free to call with any further questions.              Sincerely,   Loreen Freud DO

## 2010-04-12 NOTE — Progress Notes (Signed)
Summary: Percocet refill  Phone Note Refill Request   Refills Requested: Medication #1:  PERCOCET 10-325 MG TABS 1 by mouth every 6 hours as needed   Last Refilled: 03/13/2009 Was given Dilaudid on 03/29/09 #56. Army Fossa CMA  April 11, 2009 8:30 AM    Follow-up for Phone Call        done Follow-up by: Loreen Freud DO,  April 11, 2009 11:26 AM  Additional Follow-up for Phone Call Additional follow up Details #1::        pt picked up. Army Fossa CMA  April 11, 2009 11:37 AM     Prescriptions: PERCOCET 10-325 MG TABS (OXYCODONE-ACETAMINOPHEN) 1 by mouth every 6 hours as needed  #120 x 0   Entered and Authorized by:   Loreen Freud DO   Signed by:   Loreen Freud DO on 04/11/2009   Method used:   Print then Give to Patient   RxID:   [id number]
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
## 2010-04-12 NOTE — Progress Notes (Signed)
Summary: rx change  Phone Note Call from Patient Call back at Home Phone [phone]   Caller: Patient Summary of Call: Pt states that the Diluadid is not working, he would like a rx for Percocet. I told him that since the Dilaudid was just refilled on 3/14 then you more than likely would not fill this for him. He states it is very hard to find a doctor with no insurance. I told him that I would ask you but you more than likely would not write another rx for him. Please advise. Army Fossa CMA  June 05, 2009 12:07 PM   Follow-up for Phone Call        dilaudid is stronger than the percocet--- He needs to call [facility]---  If pain is that bad he can go to ER but I'm not giving any more painmeds.    Follow-up by: Loreen Freud DO,  June 05, 2009 1:47 PM  Additional Follow-up for Phone Call Additional follow up Details #1::        Pt is aware. Army Fossa CMA  June 05, 2009 1:55 PM

## 2010-04-12 NOTE — Progress Notes (Signed)
Summary: Request for Pain Medication  Phone Note Call from Patient   Caller: Patient Summary of Call: Pt called and left a voicemail stating that he spoke with Jamesetta So and she told him that Dr.Lowne would no longer be seeing him. He would like to know if Dr.Lowne will write his last rx's early because he knows that she is obligated to for 30 days after the dischage. Then he just would like to be done with the situation.  He states he cannot look for a doctor in state that he is in, he cannot eat or sleep. Please advise.   Follow-up for Phone Call        he can get refill on march 15 --- if he is having bad effects from taking too much medication---he needs to go to ER so he can be weaned off and they can make sure he does not have  DTs.    Follow-up by: Loreen Freud DO,  May 09, 2009 11:38 AM  Additional Follow-up for Phone Call Additional follow up Details #1::        Spoke with pt and informed pt of this, he can come pick up on the 15th. Army Fossa CMA  May 09, 2009 11:41 AM

## 2010-04-12 NOTE — Progress Notes (Signed)
Summary: Appt Cx'd-2 request  Phone Note Outgoing Call Call back at [facility] Phone [phone] Call back at Cell# [phone]   Call placed by: Barnie Mort,  June 05, 2009 10:56 AM Call placed to: Patient Details for Reason: Calling to cancel appointment Summary of Call: Patient understands that his appointment scheduled for 06-06-09 has been cancelled.  Patient has two request. 1.  Wants to change pain meds to something stronger(said you both discussed this before) and needs zanax. 2. Can you write a letter to Granville Lewis, [facility].  [address] [place]  [address]. Fax # [phone] and his Lawyer: Vickki Hearing phone# [phone]. Fax# [phone].  stating that his condition has worsen because of the delay in his disability.    Initial call taken by: Barnie Mort,  June 05, 2009 11:16 AM  Follow-up for Phone Call        He is on the strongest pain med by [facility] is why I was trying to get him into pain management and told him to go to [facility]---I felt they may be able to get him in somewhere we could not.  He was given #120 xanax on 3/14  Letter done  Follow-up by: Loreen Freud DO,  June 05, 2009 11:32 AM  Additional Follow-up for Phone Call Additional follow up Details #1::        Pt is aware the letter has be been faxed and he is aware that he cannot have any medications. Army Fossa CMA  June 05, 2009 11:40 AM

## 2010-04-12 NOTE — Assessment & Plan Note (Signed)
Summary: back pain/kdc   Vital Signs:  Patient profile:   35 year old male Weight:      157.13 pounds Temp:     98.2 degrees F oral Pulse rate:   96 / minute Pulse rhythm:   regular BP sitting:   126 / 82  (left arm) Cuff size:   large  Vitals Entered By: Army Fossa CMA (March 13, 2009 11:38 AM) CC: Upper back pain since friday.    History of Present Illness: Pt here to discuss pain meds.  The dilaudid is not working anymore.  Pt is still waiting on disability hearing.  Reviewed last ov---pt was taking too much med.  Pt understands this is a problem.     Current Medications (verified): 1)  Percocet 10-325 Mg Tabs (Oxycodone-Acetaminophen) .Marland Kitchen.. 1 By Mouth Every 6 Hours As Needed 2)  Xanax 1 Mg Tabs (Alprazolam) .Marland Kitchen.. 1 By Mouth Two Times A Day  Allergies (verified): No Known Drug Allergies  Past History:  Past medical, surgical, family and social histories (including risk factors) reviewed for relevance to current acute and chronic problems.  Past Medical History: Reviewed history from 01/27/2008 and no changes required. Depression Anxiety cervical stenosis   Past Surgical History: Reviewed history from 01/27/2008 and no changes required. Denies surgical history  Family History: Reviewed history from 01/27/2008 and no changes required. Family History of CAD Male 1st degree relative <60 Family History of CAD Male 1st degree relative <50 Family History Hypertension parkinsons disease MS A fib  Family History of Colon CA  F--MI x2 Bipolar on both sides of family  Social History: Reviewed history from 11/19/2007 and no changes required. unemployed Single Current Smoker Regular exercise-no  Review of Systems      See HPI  Physical Exam  General:  alert and disheveled.   Psych:  depressed affect, flat affect, subdued, and poor eye contact.     Impression & Recommendations:  Problem # 1:  SPINAL STENOSIS, CERVICAL (ICD-723.0)  Orders: Pain  Clinic Referral (Pain) Tobacco use cessation intermediate 3-10 minutes (99406)---change to percocet--  pt aware we will refill meds only until referral done. Then pain management will take over.    Problem # 2:  DRUG DEPENDENCE (ICD-304.90)  Orders: Tobacco use cessation intermediate 3-10 minutes (28413)  Problem # 3:  TOBACCO USE (ICD-305.1)  Encouraged smoking cessation and discussed different methods for smoking cessation.   Orders: Tobacco use cessation intermediate 3-10 minutes (99406)  Problem # 4:  ANXIETY (ICD-300.00)  His updated medication list for this problem includes:    Xanax 1 Mg Tabs (Alprazolam) .Marland Kitchen... 1 by mouth two times a day  Orders: Tobacco use cessation intermediate 3-10 minutes (99406)  Complete Medication List: 1)  Percocet 10-325 Mg Tabs (Oxycodone-acetaminophen) .Marland Kitchen.. 1 by mouth every 6 hours as needed 2)  Xanax 1 Mg Tabs (Alprazolam) .Marland Kitchen.. 1 by mouth two times a day Prescriptions: PERCOCET 10-325 MG TABS (OXYCODONE-ACETAMINOPHEN) 1 by mouth every 6 hours as needed  #120 x 0   Entered and Authorized by:   Loreen Freud DO   Signed by:   Loreen Freud DO on 03/13/2009   Method used:   Print then Give to Patient   RxID:   [id number] XANAX 1 MG TABS (ALPRAZOLAM) 1 by mouth two times a day  #90 x 0   Entered and Authorized by:   Loreen Freud DO   Signed by:   Loreen Freud DO on 03/13/2009   Method used:   Print  then Give to Patient   RxID:   [id number]

## 2010-04-12 NOTE — Progress Notes (Signed)
Summary: Urgent request for last 4 spine xrays-wants to pick up  Patient discharged from [facility]. I called Mr. Callow back and asked him if he could fax back the release form and indicate where he wanted the DVD mailed to. He is insisting on coming to pick it up as soon as he leaves his lawyers office. I've called Security to ask for assistance with this transaction.  Appended Document: Urgent request for last 4 spine xrays-wants to pick up Patient arrived at the [facility] to pick up a disc that was made for him during my absence. Xrays from 2009-2011 were made available to patient.   Appended Document: Urgent request for last 4 spine xrays-wants to pick up Pt came to [facility].Marland Kitchenasking for his CD with his x-rays on it I called Bennie [place] and asked her what to do Pt has been Dismissed from [facility].Marland KitchenMarland KitchenI told him the CD he got from [facility] has the exact same thing on it here. he was ok with that. He then asked me why he was being Dismissed from the Practice I told him I did not know. He asked if all his Records could be Copied from 09-Present from [facility]. I spoke with Bennie [place] about this she ok'd for the Records to be copied. I copied all Records from 09-present and gave to Pt. I told him all the other X-rays prior to 09 would be Copied and Mailed to him...KM

## 2010-04-12 NOTE — Progress Notes (Signed)
Summary: Refill  Phone Note Call from Patient   Caller: Patient Summary of Call: The pt called and stated that his mom is going to have the car all week and today is the only day he can come to pick up his medications. He said he knows technically he is not supposed to get them until tomorrow but would like to know if you will fill it today for him. Please advise. Army Fossa CMA  May 22, 2009 8:16 AM   Follow-up for Phone Call        ok to refill x1 each  ( for the last time!!!!) Follow-up by: Loreen Freud DO,  May 22, 2009 9:13 AM  Additional Follow-up for Phone Call Additional follow up Details #1::        Pt aware- rx is ready for pick up.Army Fossa CMA  May 22, 2009 9:20 AM     Prescriptions: ALPRAZOLAM 1 MG TABS (ALPRAZOLAM) 1 by mouth q6h as needed  #120 x 0   Entered and Authorized by:   Loreen Freud DO   Signed by:   Loreen Freud DO on 05/22/2009   Method used:   Print then Give to Patient   RxID:   [id number] DILAUDID 8 MG TABS (HYDROMORPHONE HCL) 1 by mouth q6h as needed  #90 x 0   Entered and Authorized by:   Loreen Freud DO   Signed by:   Loreen Freud DO on 05/22/2009   Method used:   Print then Give to Patient   RxID:   [id number]

## 2010-04-12 NOTE — Progress Notes (Signed)
Summary: PAIN MGMT REFERRAL  Phone Note Other Incoming   Summary of Call: REFERENCE PAIN MGMT REFERRAL......[facility] ARE THE ONLY [place] [facility].  TODAY A FAX WAS REC'D THEM, AND THEY HAVE DECLINED TO ACCEPT THIS PATIENT.  REASON DUE TO PT'S MULTIPLE COMPLICATED CONDITIONS.  THEIR PHYSICIANS FEEL PT WOULD BE BETTER SUITED TO BE TREATED VIA A [facility]ERSITY [facility]ED FACILITY, AND THAT THEY ARE UNABLE TO OFFER A TREATMENT PLAN.  I WILL INFORM PATIENT. Initial call taken by: Magdalen Spatz [facility],  March 14, 2009 5:14 PM  Follow-up for Phone Call        problem is He has to be in a [place] facility unless [place] based facility will work out payment plan with him Follow-up by: Loreen Freud DO,  March 14, 2009 5:22 PM

## 2010-06-12 LAB — CBC
HCT: 44.5 % (ref 39.0–52.0)
Hemoglobin: 15.3 g/dL (ref 13.0–17.0)
MCHC: 34.3 g/dL (ref 30.0–36.0)
MCV: 91.9 fL (ref 78.0–100.0)
Platelets: 199 10*3/uL (ref 150–400)
RBC: 4.84 MIL/uL (ref 4.22–5.81)
RDW: 13.3 % (ref 11.5–15.5)
WBC: 6.7 10*3/uL (ref 4.0–10.5)

## 2010-06-21 LAB — URINALYSIS, ROUTINE W REFLEX MICROSCOPIC
Ketones, ur: NEGATIVE mg/dL
Nitrite: NEGATIVE
Protein, ur: NEGATIVE mg/dL
pH: 5.5 (ref 5.0–8.0)

## 2010-06-21 LAB — POCT I-STAT, CHEM 8
Calcium, Ion: 1.25 mmol/L (ref 1.12–1.32)
HCT: 49 % (ref 39.0–52.0)
TCO2: 30 mmol/L (ref 0–100)

## 2010-06-23 ENCOUNTER — Emergency Department (HOSPITAL_COMMUNITY): Payer: Medicare Other

## 2010-06-23 ENCOUNTER — Emergency Department (HOSPITAL_COMMUNITY)
Admission: EM | Admit: 2010-06-23 | Discharge: 2010-06-23 | Disposition: A | Discharge status: Home / Self Care | Payer: Medicare Other | Attending: Emergency Medicine | Admitting: Emergency Medicine

## 2010-06-23 DIAGNOSIS — W010XXA Fall on same level from slipping, tripping and stumbling without subsequent striking against object, initial encounter: Secondary | ICD-10-CM | POA: Insufficient documentation

## 2010-06-23 DIAGNOSIS — Y929 Unspecified place or not applicable: Secondary | ICD-10-CM | POA: Insufficient documentation

## 2010-06-23 DIAGNOSIS — S3981XA Other specified injuries of abdomen, initial encounter: Secondary | ICD-10-CM | POA: Insufficient documentation

## 2010-06-23 DIAGNOSIS — R10819 Abdominal tenderness, unspecified site: Secondary | ICD-10-CM | POA: Insufficient documentation

## 2010-06-23 DIAGNOSIS — F411 Generalized anxiety disorder: Secondary | ICD-10-CM | POA: Insufficient documentation

## 2010-06-23 DIAGNOSIS — R109 Unspecified abdominal pain: Secondary | ICD-10-CM | POA: Insufficient documentation

## 2010-06-23 DIAGNOSIS — G40909 Epilepsy, unspecified, not intractable, without status epilepticus: Secondary | ICD-10-CM | POA: Insufficient documentation

## 2010-06-23 LAB — POCT I-STAT, CHEM 8
Calcium, Ion: 1.14 mmol/L (ref 1.12–1.32)
Chloride: 101 mEq/L (ref 96–112)
Glucose, Bld: 102 mg/dL — ABNORMAL HIGH (ref 70–99)
HCT: 45 % (ref 39.0–52.0)

## 2010-06-23 MED ORDER — IOHEXOL 300 MG/ML  SOLN: 100.0000 mL | Freq: Once | INTRAMUSCULAR | Status: DC | PRN

## 2010-07-21 ENCOUNTER — Emergency Department (HOSPITAL_COMMUNITY)
Admission: EM | Admit: 2010-07-21 | Discharge: 2010-07-21 | Disposition: A | Discharge status: Home / Self Care | Payer: Medicare Other | Attending: Emergency Medicine | Admitting: Emergency Medicine

## 2010-07-21 DIAGNOSIS — R109 Unspecified abdominal pain: Secondary | ICD-10-CM | POA: Insufficient documentation

## 2010-07-21 DIAGNOSIS — R569 Unspecified convulsions: Secondary | ICD-10-CM | POA: Insufficient documentation

## 2010-07-21 DIAGNOSIS — Z9889 Other specified postprocedural states: Secondary | ICD-10-CM | POA: Insufficient documentation

## 2010-07-24 NOTE — Assessment & Plan Note (Signed)
[facility]                                 ON-CALL NOTE   Brent Morales, Brent Morales                        MRN:          [id number]  DATE:10/15/2008                            DOB:          11/12/1975    Time is at 1:28 a.m.   PHONE NUMBER:  [phone].   CALLER:  Talbert Forest.   Dr. Laury Axon is the regular doctor.   CHIEF COMPLAINT:  Fever.   Talbert Forest says [name] had a CT for hernia today to check it out further he  had it with dye.  He has never had problems before, but he has developed  fever, cold, chills, and feeling very sick to night.  No rash.  No  shortness of breath.  No swelling of the tongue or throat or wheezing  but starting to feel worse by the minute.  I advised her to go ahead and  give him some Benadryl and also some Tylenol if she wants to help with  the possible allergic reaction and fever and take him to the emergency  room now for evaluation which is what she is going to do.     Marne A. Tower, MD  Electronically Signed    MAT/MedQ  DD: 10/15/2008  DT: 10/15/2008  Job #: [id number]

## 2010-07-27 NOTE — Assessment & Plan Note (Signed)
[facility]                                 ON-CALL NOTE   Brent Morales, Brent Morales                        MRN:          [id number]  DATE:07/25/2008                            DOB:          12-Dec-1975    DATE AND TIME:  Jul 25, 2008, at 5:24 p.m.   PHONE NUMBER:  [phone].   REGULAR DOCTOR:  Dr. Laury Axon   CHIEF COMPLAINT:  Needing Xanax prescription.  He said, he called the  office earlier and told that his Xanax prescription was faxed in to his  pharmacy, but it is not there.  I looked it up on the computer, it looks  like it was faxed to [facility] on [address].  He thought it was sent  to [facility], I told him to check with the [facility] on [address], that it  is probably waiting for him there.  I advised him that if it is not or  he has further problems, to call the office in the morning for  clarification.     Marne A. Tower, MD  Electronically Signed    MAT/MedQ  DD: 07/25/2008  DT: 07/26/2008  Job #: [id number]

## 2010-07-27 NOTE — Op Note (Signed)
NAME:  Brent Morales, Brent Morales NO.:  [id number]   MEDICAL RECORD NO.:  [id number]          PATIENT TYPE:  OIB   LOCATION:  2899                         FACILITY:  [facility]   PHYSICIAN:  Doylene Canning. Ladona Ridgel, M.D.  DATE OF BIRTH:  1975/12/22   DATE OF PROCEDURE:  01/06/2004  DATE OF DISCHARGE:                                 OPERATIVE REPORT   PROCEDURE PERFORMED:  Head up tilt table testing.   INDICATIONS FOR PROCEDURE:  Recurrent unexplained syncope.   INTRODUCTION:  The patient is a 35 year old man who was referred by Lelon Perla, M.D. to Pricilla Riffle, M.D. for evaluation of syncope and chest  pain.  He is now referred for head up tilt table testing.   DESCRIPTION OF PROCEDURE:  After informed consent was obtained, the patient  was taken to the diagnostic electrophysiology laboratory in a fasting state.  After the usual preparation and draping, he was placed in a supine position  where his initial blood pressure was 124/72 and his heart rate was 58.  He  was allowed to be maintained in this position for five minutes.  At the end  of the five minutes with equilibration in the supine position, the blood  pressure was 124/74 and the pulse 61.  He was then tilted upright to the 70  degree position.  His heart rate increased from the low 60s up to the low  80s over a period of approximately 25 minutes.  The blood pressure which was  initially in the low 120s remained in this range occasionally dropping into  the 115 to 110 systolic range.  He was maintained in this position for 35  minutes and he had no symptoms and no hemodynamic change.  At the end of 35  minutes of tilting the blood pressure remained in the 120s with a heart rate  in the 70 to 80 range.  He was then placed back in the supine position and  isoproterenol was initiated.  This resulted in an increase in his heart rate  from the low 60s up into the high 80s and he was then placed back in the 70  degree head up  tilt table position.  His blood pressure remained initially  in the 115 to 120 range but then increased into the 130 range.  His heart  rate increased to the mid-90s but again did not fluctuate other than this.  At this point after approximately 15 minutes of head up tilt table testing  with isoproterenol he was placed back in the supine position and returned to  his room in satisfactory condition.   COMPLICATIONS:  There were no immediate procedure complications.   RESULTS:  Results demonstrate no evidence of inducible neurally mediated  syncope during head up tilt table testing.  Additional evaluation to follow.      GWT/MEDQ  D:  01/06/2004  T:  01/06/2004  Job:  [id number]   cc:   Pricilla Riffle, M.D.   Loreen Freud, M.D.

## 2010-08-05 ENCOUNTER — Emergency Department (HOSPITAL_COMMUNITY)
Admission: EM | Admit: 2010-08-05 | Discharge: 2010-08-05 | Disposition: A | Discharge status: Home / Self Care | Payer: Medicare Other | Attending: Emergency Medicine | Admitting: Emergency Medicine

## 2010-08-05 ENCOUNTER — Emergency Department (HOSPITAL_COMMUNITY): Payer: Medicare Other

## 2010-08-05 DIAGNOSIS — R112 Nausea with vomiting, unspecified: Secondary | ICD-10-CM | POA: Insufficient documentation

## 2010-08-05 DIAGNOSIS — R109 Unspecified abdominal pain: Secondary | ICD-10-CM | POA: Insufficient documentation

## 2010-08-05 LAB — COMPREHENSIVE METABOLIC PANEL
BUN: 7 mg/dL (ref 6–23)
CO2: 27 mEq/L (ref 19–32)
Calcium: 9 mg/dL (ref 8.4–10.5)
Creatinine, Ser: 0.77 mg/dL (ref 0.4–1.5)
GFR calc non Af Amer: >60 mL/min (ref >60)
Glucose, Bld: 95 mg/dL (ref 70–99)
Total Protein: 7.1 g/dL (ref 6.0–8.3)

## 2010-08-05 LAB — CBC
MCH: 31.9 pg (ref 26.0–34.0)
MCHC: 35.2 g/dL (ref 30.0–36.0)
MCV: 90.5 fL (ref 78.0–100.0)
Platelets: 245 10*3/uL (ref 150–400)
RDW: 13 % (ref 11.5–15.5)
WBC: 8.5 10*3/uL (ref 4.0–10.5)

## 2010-08-05 LAB — URINALYSIS, ROUTINE W REFLEX MICROSCOPIC
Bilirubin Urine: NEGATIVE
Ketones, ur: NEGATIVE mg/dL
Nitrite: NEGATIVE
Protein, ur: NEGATIVE mg/dL
Specific Gravity, Urine: 1.016 (ref 1.005–1.030)
Urobilinogen, UA: 0.2 mg/dL (ref 0.0–1.0)

## 2010-08-05 LAB — DIFFERENTIAL
Eosinophils Absolute: 0.2 10*3/uL (ref 0.0–0.7)
Eosinophils Relative: 2 % (ref 0–5)
Lymphs Abs: 2.1 10*3/uL (ref 0.7–4.0)
Monocytes Absolute: 0.7 10*3/uL (ref 0.1–1.0)

## 2010-08-05 LAB — OCCULT BLOOD, POC DEVICE: Fecal Occult Bld: NEGATIVE

## 2010-08-05 LAB — LIPASE, BLOOD: Lipase: 32 U/L (ref 11–59)

## 2010-09-07 ENCOUNTER — Ambulatory Visit (INDEPENDENT_AMBULATORY_CARE_PROVIDER_SITE_OTHER): Payer: Medicare Other | Admitting: Surgery

## 2010-09-07 ENCOUNTER — Encounter (INDEPENDENT_AMBULATORY_CARE_PROVIDER_SITE_OTHER): Payer: Self-pay | Admitting: Surgery

## 2010-09-07 DIAGNOSIS — R109 Unspecified abdominal pain: Secondary | ICD-10-CM

## 2010-09-07 MED ORDER — OXYCODONE-ACETAMINOPHEN 10-325 MG PO TABS: 1.0000 | ORAL_TABLET | ORAL | Status: DC | PRN

## 2010-09-07 NOTE — Progress Notes (Signed)
Subjective:     Patient ID: Brent Bullocks., male   DOB: 02-04-1976, 35 y.o.   MRN: [id number]    There were no vitals taken for this visit.    HPI The patient returns today.  His pain in his lower abdomen is a little better.  He still has significant pain in both groins.   Review of Systems negative    Objective:   Physical Exam Mild discomfort bilateral inguinal canals.  No hernia noted     Assessment:   Chronic pain inguinal region  Plan:  Refer to pain clinic.Follow up 1 month.

## 2010-09-07 NOTE — Patient Instructions (Signed)
Please have the records  from [facility] neurosurgery faxed to [place].  [phone].  This needs to be done so the pain clinic will see you.  Refill your pain medication today.  Return in 1 month.

## 2010-09-25 ENCOUNTER — Ambulatory Visit (INDEPENDENT_AMBULATORY_CARE_PROVIDER_SITE_OTHER): Payer: Medicare Other | Admitting: Surgery

## 2010-09-25 ENCOUNTER — Encounter (INDEPENDENT_AMBULATORY_CARE_PROVIDER_SITE_OTHER): Payer: Self-pay | Admitting: Surgery

## 2010-09-25 DIAGNOSIS — R109 Unspecified abdominal pain: Secondary | ICD-10-CM

## 2010-09-25 DIAGNOSIS — R1030 Lower abdominal pain, unspecified: Secondary | ICD-10-CM

## 2010-09-25 MED ORDER — OXYCODONE-ACETAMINOPHEN 10-325 MG PO TABS: 1.0000 | ORAL_TABLET | ORAL | Status: DC | PRN

## 2010-09-25 NOTE — Progress Notes (Signed)
The patient returns to clinic. He continued to have low grade chronic pain in both groins. There is been no change in the quality, duration or location. It is constant dull in nature. He now has an appointment to see Dr. Vear Clock the pain clinic.  Review of systems: Bilateral groin pain  Physical exam:  GU: No evidence of recurrent hernia. Tenderness noted along the spermatic cord bilaterally. Testicles are normal.  Impressions: Status post laparoscopic bilateral inguinal hernia repair with mesh chronic pain  Plan: He has multiple other medical problems contributing to his chronic pain. He is referred to a pain clinic.  He has been told to get records from his previous physician visits. He will followup here as needed. He will receive no further pain medicine from the office.

## 2010-09-25 NOTE — Patient Instructions (Signed)
Follow up with pain clinic.  Follow up as needed.

## 2010-12-09 ENCOUNTER — Emergency Department (HOSPITAL_COMMUNITY)
Admission: EM | Admit: 2010-12-09 | Discharge: 2010-12-09 | Disposition: A | Discharge status: Home / Self Care | Payer: Medicare Other | Attending: Emergency Medicine | Admitting: Emergency Medicine

## 2010-12-09 DIAGNOSIS — F411 Generalized anxiety disorder: Secondary | ICD-10-CM | POA: Insufficient documentation

## 2010-12-09 DIAGNOSIS — G8929 Other chronic pain: Secondary | ICD-10-CM | POA: Insufficient documentation

## 2010-12-09 DIAGNOSIS — Z79899 Other long term (current) drug therapy: Secondary | ICD-10-CM | POA: Insufficient documentation

## 2010-12-09 DIAGNOSIS — R209 Unspecified disturbances of skin sensation: Secondary | ICD-10-CM | POA: Insufficient documentation

## 2010-12-09 DIAGNOSIS — M542 Cervicalgia: Secondary | ICD-10-CM | POA: Insufficient documentation

## 2010-12-09 DIAGNOSIS — R29898 Other symptoms and signs involving the musculoskeletal system: Secondary | ICD-10-CM | POA: Insufficient documentation

## 2010-12-09 DIAGNOSIS — G40909 Epilepsy, unspecified, not intractable, without status epilepticus: Secondary | ICD-10-CM | POA: Insufficient documentation

## 2011-03-19 DIAGNOSIS — F411 Generalized anxiety disorder: Secondary | ICD-10-CM | POA: Diagnosis not present

## 2011-03-19 DIAGNOSIS — M47812 Spondylosis without myelopathy or radiculopathy, cervical region: Secondary | ICD-10-CM | POA: Diagnosis not present

## 2011-04-16 DIAGNOSIS — F411 Generalized anxiety disorder: Secondary | ICD-10-CM | POA: Diagnosis not present

## 2011-04-16 DIAGNOSIS — M47812 Spondylosis without myelopathy or radiculopathy, cervical region: Secondary | ICD-10-CM | POA: Diagnosis not present

## 2011-05-14 DIAGNOSIS — F411 Generalized anxiety disorder: Secondary | ICD-10-CM | POA: Diagnosis not present

## 2011-05-14 DIAGNOSIS — M47812 Spondylosis without myelopathy or radiculopathy, cervical region: Secondary | ICD-10-CM | POA: Diagnosis not present

## 2011-06-14 DIAGNOSIS — M47812 Spondylosis without myelopathy or radiculopathy, cervical region: Secondary | ICD-10-CM | POA: Diagnosis not present

## 2011-06-14 DIAGNOSIS — F411 Generalized anxiety disorder: Secondary | ICD-10-CM | POA: Diagnosis not present

## 2011-07-12 DIAGNOSIS — M47812 Spondylosis without myelopathy or radiculopathy, cervical region: Secondary | ICD-10-CM | POA: Diagnosis not present

## 2011-08-09 DIAGNOSIS — M47812 Spondylosis without myelopathy or radiculopathy, cervical region: Secondary | ICD-10-CM | POA: Diagnosis not present

## 2011-08-09 DIAGNOSIS — F411 Generalized anxiety disorder: Secondary | ICD-10-CM | POA: Diagnosis not present

## 2011-08-27 DIAGNOSIS — M47812 Spondylosis without myelopathy or radiculopathy, cervical region: Secondary | ICD-10-CM | POA: Diagnosis not present

## 2011-08-27 DIAGNOSIS — R109 Unspecified abdominal pain: Secondary | ICD-10-CM | POA: Diagnosis not present

## 2011-08-27 DIAGNOSIS — F411 Generalized anxiety disorder: Secondary | ICD-10-CM | POA: Diagnosis not present

## 2011-08-29 ENCOUNTER — Emergency Department (HOSPITAL_COMMUNITY): Payer: Medicare Other

## 2011-08-29 ENCOUNTER — Inpatient Hospital Stay (HOSPITAL_COMMUNITY)
Admission: EM | Admit: 2011-08-29 | Discharge: 2011-08-31 | DRG: 392 | Disposition: A | Discharge status: Home / Self Care | Payer: Medicare Other | Attending: Internal Medicine | Admitting: Internal Medicine

## 2011-08-29 ENCOUNTER — Encounter (HOSPITAL_COMMUNITY): Payer: Self-pay

## 2011-08-29 DIAGNOSIS — F17200 Nicotine dependence, unspecified, uncomplicated: Secondary | ICD-10-CM | POA: Diagnosis present

## 2011-08-29 DIAGNOSIS — N509 Disorder of male genital organs, unspecified: Secondary | ICD-10-CM | POA: Diagnosis not present

## 2011-08-29 DIAGNOSIS — D72829 Elevated white blood cell count, unspecified: Secondary | ICD-10-CM | POA: Diagnosis not present

## 2011-08-29 DIAGNOSIS — K2970 Gastritis, unspecified, without bleeding: Secondary | ICD-10-CM | POA: Diagnosis not present

## 2011-08-29 DIAGNOSIS — R197 Diarrhea, unspecified: Secondary | ICD-10-CM | POA: Diagnosis present

## 2011-08-29 DIAGNOSIS — K5010 Crohn's disease of large intestine without complications: Secondary | ICD-10-CM | POA: Diagnosis not present

## 2011-08-29 DIAGNOSIS — K529 Noninfective gastroenteritis and colitis, unspecified: Secondary | ICD-10-CM | POA: Diagnosis present

## 2011-08-29 DIAGNOSIS — Z79899 Other long term (current) drug therapy: Secondary | ICD-10-CM

## 2011-08-29 DIAGNOSIS — F411 Generalized anxiety disorder: Secondary | ICD-10-CM | POA: Diagnosis present

## 2011-08-29 DIAGNOSIS — A088 Other specified intestinal infections: Principal | ICD-10-CM | POA: Diagnosis present

## 2011-08-29 DIAGNOSIS — R112 Nausea with vomiting, unspecified: Secondary | ICD-10-CM | POA: Diagnosis not present

## 2011-08-29 DIAGNOSIS — R933 Abnormal findings on diagnostic imaging of other parts of digestive tract: Secondary | ICD-10-CM | POA: Diagnosis not present

## 2011-08-29 DIAGNOSIS — R109 Unspecified abdominal pain: Secondary | ICD-10-CM | POA: Diagnosis not present

## 2011-08-29 DIAGNOSIS — R1032 Left lower quadrant pain: Secondary | ICD-10-CM | POA: Diagnosis not present

## 2011-08-29 DIAGNOSIS — R1084 Generalized abdominal pain: Secondary | ICD-10-CM | POA: Diagnosis not present

## 2011-08-29 DIAGNOSIS — Z720 Tobacco use: Secondary | ICD-10-CM | POA: Diagnosis present

## 2011-08-29 DIAGNOSIS — K591 Functional diarrhea: Secondary | ICD-10-CM | POA: Diagnosis not present

## 2011-08-29 DIAGNOSIS — R52 Pain, unspecified: Secondary | ICD-10-CM | POA: Diagnosis not present

## 2011-08-29 LAB — CBC
Platelets: 287 10*3/uL (ref 150–400)
RBC: 4.99 MIL/uL (ref 4.22–5.81)
RDW: 14 % (ref 11.5–15.5)
WBC: 18.8 10*3/uL — ABNORMAL HIGH (ref 4.0–10.5)

## 2011-08-29 LAB — URINE MICROSCOPIC-ADD ON

## 2011-08-29 LAB — DIFFERENTIAL
Basophils Absolute: 0 10*3/uL (ref 0.0–0.1)
Eosinophils Absolute: 0.1 10*3/uL (ref 0.0–0.7)
Eosinophils Relative: 0 % (ref 0–5)
Lymphocytes Relative: 11 % — ABNORMAL LOW (ref 12–46)
Lymphs Abs: 2.1 10*3/uL (ref 0.7–4.0)
Neutrophils Relative %: 83 % — ABNORMAL HIGH (ref 43–77)

## 2011-08-29 LAB — BASIC METABOLIC PANEL
CO2: 24 mEq/L (ref 19–32)
Calcium: 10 mg/dL (ref 8.4–10.5)
GFR calc non Af Amer: >90 mL/min (ref >90)
Glucose, Bld: 89 mg/dL (ref 70–99)
Potassium: 4 mEq/L (ref 3.5–5.1)
Sodium: 143 mEq/L (ref 135–145)

## 2011-08-29 LAB — URINALYSIS, ROUTINE W REFLEX MICROSCOPIC
Hgb urine dipstick: NEGATIVE
Nitrite: NEGATIVE
Protein, ur: 30 mg/dL — AB
Specific Gravity, Urine: 1.03 (ref 1.005–1.030)
Urobilinogen, UA: 0.2 mg/dL (ref 0.0–1.0)

## 2011-08-29 MED ORDER — ONDANSETRON HCL 4 MG PO TABS: 4.0000 mg | ORAL_TABLET | Freq: Four times a day (QID) | ORAL | Status: DC | PRN

## 2011-08-29 MED ORDER — METRONIDAZOLE IN NACL 5-0.79 MG/ML-% IV SOLN
500.0000 mg | Freq: Three times a day (TID) | INTRAVENOUS | Status: DC
  Administered 2011-08-30 (×2): 500 mg via INTRAVENOUS
  Filled 2011-08-29 (×4): qty 100

## 2011-08-29 MED ORDER — HYDROMORPHONE HCL PF 1 MG/ML IJ SOLN
1.0000 mg | Freq: Once | INTRAMUSCULAR | Status: AC
  Administered 2011-08-29: 1 mg via INTRAVENOUS
  Filled 2011-08-29: qty 1

## 2011-08-29 MED ORDER — ACETAMINOPHEN 650 MG RE SUPP: 650.0000 mg | Freq: Four times a day (QID) | RECTAL | Status: DC | PRN

## 2011-08-29 MED ORDER — DIPHENHYDRAMINE HCL 50 MG/ML IJ SOLN
25.0000 mg | Freq: Once | INTRAMUSCULAR | Status: AC
  Administered 2011-08-29: 25 mg via INTRAVENOUS
  Filled 2011-08-29: qty 1

## 2011-08-29 MED ORDER — ACETAMINOPHEN 325 MG PO TABS: 650.0000 mg | ORAL_TABLET | Freq: Four times a day (QID) | ORAL | Status: DC | PRN

## 2011-08-29 MED ORDER — ONDANSETRON HCL 4 MG/2ML IJ SOLN
4.0000 mg | Freq: Once | INTRAMUSCULAR | Status: AC
  Administered 2011-08-29: 4 mg via INTRAVENOUS
  Filled 2011-08-29: qty 2

## 2011-08-29 MED ORDER — DIPHENOXYLATE-ATROPINE 2.5-0.025 MG/5ML PO LIQD: 10.0000 mL | Freq: Once | ORAL | Status: DC

## 2011-08-29 MED ORDER — IOHEXOL 300 MG/ML  SOLN
100.0000 mL | Freq: Once | INTRAMUSCULAR | Status: AC | PRN
  Administered 2011-08-29: 100 mL via INTRAVENOUS

## 2011-08-29 MED ORDER — SODIUM CHLORIDE 0.9 % IV SOLN
INTRAVENOUS | Status: DC
  Administered 2011-08-29: 22:00:00 via INTRAVENOUS
  Administered 2011-08-30: 1000 mL via INTRAVENOUS
  Administered 2011-08-30 – 2011-08-31 (×3): via INTRAVENOUS

## 2011-08-29 MED ORDER — SODIUM CHLORIDE 0.9 % IV BOLUS (SEPSIS)
1000.0000 mL | Freq: Once | INTRAVENOUS | Status: AC
  Administered 2011-08-29: 1000 mL via INTRAVENOUS

## 2011-08-29 MED ORDER — ENOXAPARIN SODIUM 40 MG/0.4ML ~~LOC~~ SOLN
40.0000 mg | SUBCUTANEOUS | Status: DC
  Administered 2011-08-29 – 2011-08-30 (×2): 40 mg via SUBCUTANEOUS
  Filled 2011-08-29 (×3): qty 0.4

## 2011-08-29 MED ORDER — METHYLPREDNISOLONE SODIUM SUCC 125 MG IJ SOLR
125.0000 mg | Freq: Two times a day (BID) | INTRAMUSCULAR | Status: AC
  Administered 2011-08-29 – 2011-08-30 (×2): 125 mg via INTRAVENOUS
  Filled 2011-08-29: qty 2

## 2011-08-29 MED ORDER — OXYCODONE HCL 5 MG PO TABS
5.0000 mg | ORAL_TABLET | ORAL | Status: DC | PRN
  Administered 2011-08-30 – 2011-08-31 (×2): 5 mg via ORAL
  Filled 2011-08-29 (×3): qty 1

## 2011-08-29 MED ORDER — METHYLPREDNISOLONE SODIUM SUCC 125 MG IJ SOLR
80.0000 mg | Freq: Once | INTRAMUSCULAR | Status: DC
  Filled 2011-08-29: qty 1.28

## 2011-08-29 MED ORDER — DIPHENOXYLATE-ATROPINE 2.5-0.025 MG PO TABS
1.0000 | ORAL_TABLET | Freq: Once | ORAL | Status: AC
  Administered 2011-08-29: 1 via ORAL

## 2011-08-29 MED ORDER — HYDROMORPHONE HCL PF 1 MG/ML IJ SOLN
0.5000 mg | INTRAMUSCULAR | Status: DC | PRN
  Administered 2011-08-29 – 2011-08-31 (×10): 1 mg via INTRAVENOUS
  Filled 2011-08-29 (×10): qty 1

## 2011-08-29 MED ORDER — DIPHENOXYLATE-ATROPINE 2.5-0.025 MG PO TABS
ORAL_TABLET | ORAL | Status: AC
  Administered 2011-08-29: 1 via ORAL
  Filled 2011-08-29: qty 1

## 2011-08-29 MED ORDER — ZOLPIDEM TARTRATE 5 MG PO TABS
5.0000 mg | ORAL_TABLET | Freq: Every evening | ORAL | Status: DC | PRN
  Administered 2011-08-30: 5 mg via ORAL
  Filled 2011-08-29: qty 1

## 2011-08-29 MED ORDER — ALUM & MAG HYDROXIDE-SIMETH 200-200-20 MG/5ML PO SUSP: 30.0000 mL | Freq: Four times a day (QID) | ORAL | Status: DC | PRN

## 2011-08-29 MED ORDER — ONDANSETRON HCL 4 MG/2ML IJ SOLN: 4.0000 mg | Freq: Four times a day (QID) | INTRAMUSCULAR | Status: DC | PRN

## 2011-08-29 MED ORDER — CIPROFLOXACIN IN D5W 400 MG/200ML IV SOLN
400.0000 mg | Freq: Two times a day (BID) | INTRAVENOUS | Status: DC
  Administered 2011-08-29 – 2011-08-30 (×2): 400 mg via INTRAVENOUS
  Filled 2011-08-29 (×3): qty 200

## 2011-08-29 NOTE — ED Provider Notes (Signed)
Medical screening examination/treatment/procedure(s) were conducted as a shared visit with non-physician practitioner(s) and myself.  I personally evaluated the patient during the encounter  Pt with long history of chronic abdominal pain occasionally with testicular pain has been doing well since hernia repair last year. He reports 2 weeks of severe cramping lower abdominal pain radiating into L testicle. Abd diffusely tender, testicle tender but normally rotated, normal cremateric reflex, no mass, no hernia.   Briani Maul B. Bernette Mayers, MD 08/29/11 [phone]

## 2011-08-29 NOTE — ED Notes (Signed)
Pt states he saw MD Tuesday and was told he had another hernia. Pt has surgical consult in July. Pt states that pain is radiating down abdomen into left lower testicle. Left testicle appears more firm and pt states "I feel like its getting pulled up" and pointed to left groin. Pt states that he has had n/v/d today.

## 2011-08-29 NOTE — ED Provider Notes (Signed)
History     CSN: [id number]  Arrival date & time 08/29/11  1230   First MD Initiated Contact with Patient 08/29/11 1243      Chief Complaint  Patient presents with  . Abdominal Pain  . Testicle Pain    (Consider location/radiation/quality/duration/timing/severity/associated sxs/prior treatment) The history is provided by medical records and the patient.    Patient has history of inguinal hernia repair approximately 1 to 2 years ago as well as history of chronic neck pain for which he goes to a pain clinic presents to emergency department complaining of a one-week history of gradual onset left lower quadrant abdominal pain with radiation of pain into his left testes. Patient states that pain was gradual onset, persistent, and worsening. Patient went to see his pain management clinic on Tuesday, 2 days ago, for increasing severe pain and was told by his provider that he "probably had another hernia" and told him he needed to see the general surgeon in the near future. Patient states he had hx of chronic abdominal pain with radiation of pain into teste before he had his hernias repaired. Patient states that he normally takes Percocet for his chronic neck pain however the provider wrote him some additional Vicodin as well as rewritten Xanax. Patient states that he has completed all the Vicodin and finished his Percocet due to ongoing, worsening pain. Patient states that other than history of anxiety, hernia repair, and chronic neck pain, he has no other known medical problems. Patient smokes tobacco but denies alcohol or illicit drug use. Patient states pain is associated with nausea but denies vomiting or diarrhea. He denies dysuria or hematuria. He denies penile discharge. Denies aggravating or alleviating factors.  Past Medical History  Diagnosis Date  . Hernia     Past Surgical History  Procedure Date  . Inguinal hernia repair   . Thumb surgery     Family History  Problem Relation  Age of Onset  . Hypertension Mother   . Heart disease Father     History  Substance Use Topics  . Smoking status: Current Everyday Smoker -- 1.0 packs/day  . Smokeless tobacco: Not on file  . Alcohol Use: No      Review of Systems  All other systems reviewed and are negative.    Allergies  Iohexol  Home Medications   Current Outpatient Rx  Name Route Sig Dispense Refill  . ALPRAZOLAM 1 MG PO TABS Oral Take 1 mg by mouth 3 (three) times daily as needed.      . OXYCODONE-ACETAMINOPHEN 10-325 MG PO TABS Oral Take 1 tablet by mouth every 4 (four) hours as needed. 40 tablet 0    BP 115/78  Pulse 77  Temp 97.9 F (36.6 C) (Oral)  Resp 16  SpO2 100%  Physical Exam  Nursing note and vitals reviewed. Constitutional: He is oriented to person, place, and time. He appears well-developed and well-nourished. No distress.       Uncomfortable appearing.   HENT:  Head: Normocephalic and atraumatic.  Eyes: Conjunctivae are normal.  Neck: Normal range of motion. Neck supple.  Cardiovascular: Normal rate, regular rhythm, normal heart sounds and intact distal pulses.  Exam reveals no gallop and no friction rub.   No murmur heard. Pulmonary/Chest: Effort normal and breath sounds normal. No respiratory distress. He has no wheezes. He has no rales. He exhibits no tenderness.  Abdominal: Bowel sounds are normal. He exhibits no distension and no mass. There is tenderness. There is guarding.  There is no rebound.       LLQ TTP with guarding. Severe TTP. No peritoneal signs.   Genitourinary: Penis normal. No penile tenderness.       Left teste TTP but no scrotal changes or swelling. No palpable mass.   Musculoskeletal: Normal range of motion. He exhibits no edema and no tenderness.  Neurological: He is alert and oriented to person, place, and time.  Skin: Skin is warm and dry. No rash noted. He is not diaphoretic. No erythema.  Psychiatric: He has a normal mood and affect.    ED Course    Procedures (including critical care time)  IV dilaudid and zofran.   Patient evaluated by Dr. Bernette Mayers at bedside and based on hx of similar recurrent pain and hx of numerous CT scans to evaluate abdominal pain does not recommend imaging initially unless labs or re-evaluation of patient where to prompt imaging.   Elevated WBC prompts CT abdomen/pelvis. Still doubt testicular torsion given hx of pain x 1 week and hx of recurrent similar pain in the past.   Labs Reviewed  CBC - Abnormal; Notable for the following:    WBC 18.8 (*)     All other components within normal limits  DIFFERENTIAL - Abnormal; Notable for the following:    Neutrophils Relative 83 (*)     Neutro Abs 15.6 (*)     Lymphocytes Relative 11 (*)     Monocytes Absolute 1.1 (*)     All other components within normal limits  BASIC METABOLIC PANEL  URINALYSIS, ROUTINE W REFLEX MICROSCOPIC   No results found.   No diagnosis found.    MDM   Sign out given to CDU mid level provider, Heather, with patient's CT scan abdomen pelvis pending. dispo pending further evaluation in the ER. Will continue to monitor. Still consider testicular torsion a very low likelihood and will begin with the CT scan of abdomen and pelvis.      [place], [place] 08/29/11 1503

## 2011-08-29 NOTE — ED Notes (Signed)
CLEAR LIQUID DIET SPOKE WITH MONICA

## 2011-08-29 NOTE — ED Notes (Addendum)
Pt had BM while in CT and requesting to take shower. Toiletries and towels given . Pt denies dizziness. VSS

## 2011-08-29 NOTE — ED Notes (Signed)
Patient is resting comfortably. 

## 2011-08-29 NOTE — ED Notes (Addendum)
Per ems- pt c/o LLQ abd pain radiating down to left testicle. Pt has hx of left sided hernia repair surgery. Pt c/o n/v.

## 2011-08-29 NOTE — H&P (Signed)
DATE OF ADMISSION:  08/29/2011  PCP:   No primary provider on file.   Chief Complaint:  ABD Pain   HPI: Brent Morales. is an 36 y.o. male  Who presents to the ED with complaints of left Lower Quadrant ABD Pain radiating into his left testicle X 1 week.  He reports that the pain as worse today, and he also had nausea and diarrhea today as well.  He denies having blood in his stools.  He denies fever and chills but does report having sweats off and on for the past 2 days.  A Ct scan was performed in the ED and this revealed changes consistent with a nonspecific Enteritis, and diffuse wall thickening seen in the Sigmoid Colon and Ascending Colon.    Past Medical History  Diagnosis Date  . Hernia     Bilateral Inguinal    Past Surgical History  Procedure Date  . Inguinal hernia repair   . Thumb surgery     Medications:  HOME MEDS: Prior to Admission medications   Medication Sig Start Date End Date Taking? Authorizing Provider  ALPRAZolam Prudy Feeler) 0.5 MG tablet Take 0.5 mg by mouth 3 (three) times daily as needed. For anxiety   Yes Historical Provider, MD  HYDROcodone-acetaminophen (VICODIN) 5-500 MG per tablet Take 1 tablet by mouth every 6 (six) hours as needed. For pain   Yes Historical Provider, MD  oxycodone (OXY-IR) 5 MG capsule Take 5 mg by mouth every 8 (eight) hours as needed. For pain   Yes Historical Provider, MD  Pseudoeph-Doxylamine-DM-APAP (NYQUIL) 60-7.08-07-998 MG/30ML LIQD Take by mouth at bedtime as needed. For cough   Yes Historical Provider, MD    Allergies:  Allergies  Allergen Reactions  . Iohexol      Desc: PER MD/PER PATIENT NOT ALLERGIC 06/11/05 RM/PER RADIOLOGIST GIVE PREMEDICATION 06/12/05     Social History:   reports that he has been smoking.  He does not have any smokeless tobacco history on file. He reports that he does not drink alcohol or use illicit drugs.  Family History: Family History  Problem Relation Age of Onset  . Hypertension Mother     . Heart disease Father   . Crohn's disease Maternal Aunt   . Colon cancer Paternal Grandfather     Review of Systems:  The patient denies anorexia, fever, weight loss, vision loss, decreased hearing, hoarseness, chest pain, syncope, dyspnea on exertion, peripheral edema, balance deficits, hemoptysis, melena, hematochezia, severe indigestion/heartburn, hematuria, incontinence, genital sores, muscle weakness, suspicious skin lesions, transient blindness, difficulty walking, depression, unusual weight change, abnormal bleeding, enlarged lymph nodes, angioedema, and breast masses.   Physical Exam:  GEN:  Pleasant 36 year old thin well developed Caucasian male examined  and in Discomfort but no acute distress; cooperative with exam Filed Vitals:   08/29/11 1343 08/29/11 1650 08/29/11 1654 08/29/11 1817  BP: 117/77 127/75 127/75 126/74  Pulse: 73   69  Temp:      TempSrc:      Resp: 13 16 18 16   SpO2: 99% 99% 100% 99%   Blood pressure 126/74, pulse 69, temperature 97.9 F (36.6 C), temperature source Oral, resp. rate 16, SpO2 99.00%. PSYCH: He is alert and oriented x4; does not appear anxious does not appear depressed; affect is normal HEENT: Normocephalic and Atraumatic, Mucous membranes pink; PERRLA; EOM intact; Fundi:  Benign;  No scleral icterus, Nares: Patent, Oropharynx: Clear, Fair Dentition, Neck:  FROM, no cervical lymphadenopathy nor thyromegaly or carotid bruit;  no JVD; Breasts:: Not examined CHEST WALL: No tenderness CHEST: Normal respiration, clear to auscultation bilaterally HEART: Regular rate and rhythm; no murmurs rubs or gallops BACK: No kyphosis or scoliosis; no CVA tenderness ABDOMEN: Positive Bowel Sounds, Scaphoid, soft non-tender; no masses, no organomegaly.   Rectal Exam: Not done EXTREMITIES: No bone or joint deformity; no cyanosis, clubbing or edema; no ulcerations. Genitalia: not examined PULSES: 2+ and symmetric SKIN: Normal hydration no rash or  ulceration CNS: Cranial nerves 2-12 grossly intact no focal neurologic deficit   Labs & Imaging Results for orders placed during the hospital encounter of 08/29/11 (from the past 48 hour(s))  CBC     Status: Abnormal   Collection Time   08/29/11  1:07 PM      Component Value Range Comment   WBC 18.8 (*) 4.0 - 10.5 K/uL    RBC 4.99  4.22 - 5.81 MIL/uL    Hemoglobin 16.1  13.0 - 17.0 g/dL    HCT 91.4  78.2 - 95.6 %    MCV 90.6  78.0 - 100.0 fL    MCH 32.3  26.0 - 34.0 pg    MCHC 35.6  30.0 - 36.0 g/dL    RDW 21.3  08.6 - 57.8 %    Platelets 287  150 - 400 K/uL   DIFFERENTIAL     Status: Abnormal   Collection Time   08/29/11  1:07 PM      Component Value Range Comment   Neutrophils Relative 83 (*) 43 - 77 %    Neutro Abs 15.6 (*) 1.7 - 7.7 K/uL    Lymphocytes Relative 11 (*) 12 - 46 %    Lymphs Abs 2.1  0.7 - 4.0 K/uL    Monocytes Relative 6  3 - 12 %    Monocytes Absolute 1.1 (*) 0.1 - 1.0 K/uL    Eosinophils Relative 0  0 - 5 %    Eosinophils Absolute 0.1  0.0 - 0.7 K/uL    Basophils Relative 0  0 - 1 %    Basophils Absolute 0.0  0.0 - 0.1 K/uL   BASIC METABOLIC PANEL     Status: Normal   Collection Time   08/29/11  1:07 PM      Component Value Range Comment   Sodium 143  135 - 145 mEq/L    Potassium 4.0  3.5 - 5.1 mEq/L    Chloride 107  96 - 112 mEq/L    CO2 24  19 - 32 mEq/L    Glucose, Bld 89  70 - 99 mg/dL    BUN 11  6 - 23 mg/dL    Creatinine, Ser 4.69  0.50 - 1.35 mg/dL    Calcium 62.9  8.4 - 10.5 mg/dL    GFR calc non Af Amer >90  >90 mL/min    GFR calc Af Amer >90  >90 mL/min   URINALYSIS, ROUTINE W REFLEX MICROSCOPIC     Status: Abnormal   Collection Time   08/29/11  4:02 PM      Component Value Range Comment   Color, Urine AMBER (*) YELLOW BIOCHEMICALS MAY BE AFFECTED BY COLOR   APPearance CLEAR  CLEAR    Specific Gravity, Urine 1.030  1.005 - 1.030    pH 5.5  5.0 - 8.0    Glucose, UA NEGATIVE  NEGATIVE mg/dL    Hgb urine dipstick NEGATIVE  NEGATIVE     Bilirubin Urine SMALL (*) NEGATIVE    Ketones, ur 40 (*)  NEGATIVE mg/dL    Protein, ur 30 (*) NEGATIVE mg/dL    Urobilinogen, UA 0.2  0.0 - 1.0 mg/dL    Nitrite NEGATIVE  NEGATIVE    Leukocytes, UA TRACE (*) NEGATIVE   URINE MICROSCOPIC-ADD ON     Status: Abnormal   Collection Time   08/29/11  4:02 PM      Component Value Range Comment   WBC, UA 0-2  <3 WBC/hpf    RBC / HPF 0-2  <3 RBC/hpf    Bacteria, UA RARE  RARE    Casts GRANULAR CAST (*) NEGATIVE    Urine-Other MUCOUS PRESENT      Ct Abdomen Pelvis W Contrast  08/29/2011  *RADIOLOGY REPORT*  Clinical Data: Abdominal pain.  Nausea, vomiting and diarrhea.  CT ABDOMEN AND PELVIS WITH CONTRAST  Technique:  Multidetector CT imaging of the abdomen and pelvis was performed following the standard protocol during bolus administration of intravenous contrast.  Contrast: OMNIPAQUE IOHEXOL 300 MG/ML  SOLN  Comparison: CT abdomen and pelvis without contrast 08/05/2010.  Findings: The lung bases are clear without focal nodule, mass, or airspace disease.  The heart size is normal.  No significant pleural or pericardial effusion is present.  The liver and spleen are within normal limits.  The stomach, duodenum, and pancreas are unremarkable.  The common bile duct and gallbladder are normal.  The gallbladder is mostly collapsed.  The adrenal glands are normal bilaterally.  The kidneys and ureters are unremarkable.  Urinary bladder is within normal limits.  It is mostly collapsed.  There is mild wall thickening throughout the colon.  This is most prominent in the proximal ascending and distal sigmoid colon. There is marked wall thickening of the jejunum with multiple mesenteric lymph nodes.  Contrast is seen throughout the remainder of the colon.  The terminal ileum is unremarkable.  There is free fluid in the right pericolic gutter extending into the pelvis.  The bone windows are unremarkable.  IMPRESSION:  1.  Marked wall thickening and mesenteric lymph  nodes involving the proximal jejunum compatible with a nonspecific enteritis. 2.  Mild wall thickening throughout the colon with particular prominence in the ascending and distal sigmoid colon.  This raises the possibility of a more diffuse process such as Crohn's enterocolitis.  The terminal ileum is relatively normal, arguing against this. 3.  Free fluid in the right pericolic gutter extending into the anatomic pelvis is compatible with an acute inflammatory process.  Original Report Authenticated By: Jamesetta Orleans. MATTERN, M.D.      Assessment: Present on Admission:  .Colitis, acute .Abdominal pain, acute, left lower quadrant .Leukocytosis .Diarrhea .Tobacco abuse disorder    Plan:    Admit to Med Surg Bed. Pain Control and Antiemetics PRN IVFs IV Cipro and Flagyl IV Steroid taper DVT prophylaxis.   Other plans as per orders.    CODE STATUS:      FULL CODE        Brent Morales C 08/29/2011, 8:34 PM

## 2011-08-29 NOTE — ED Notes (Signed)
Pt knows that urine is needed. Pt is unable to void at this time.  

## 2011-08-29 NOTE — ED Notes (Signed)
CLEAR LIQUID DIET ORDERED SPOKE WITH MONICA

## 2011-08-29 NOTE — ED Notes (Signed)
Pt had alprazolam and percocet filled 6/14, 30 pills each and has taken all of the pills.

## 2011-08-29 NOTE — ED Notes (Signed)
Patient is unable to void at this time 

## 2011-08-29 NOTE — ED Provider Notes (Signed)
3:47 PM Assumed care of patient in the CDU.  Patient comes in today with a chief complaint of LLQ abdominal pain radiating down to his left testicle for the past week.  Patient is awaiting CT scan of his ab/pelvis with contrast.  Plan is for patient to be discharged home if CT is negative.  Reassessed patient.  He reports that his pain is controlled at this time.  Patient alert and orientated x 3, Heart RRR, Lungs CTAB, Abdomen tender to palpation of the  LLQ with guarding.  No rebound.    6:00 PM  Reassessed patient.  Patient reports that his pain is increasing again at this time and is requesting more pain medication.  Patient also actively having diarrhea.  No vomiting.    7:00 PM Discussed results of the CT scan with patient.  Patient reports that he does not have any prior history of Crohn's Disease.  He does have an Aunt that has Crohn's disease, but not other family members.  7:30 PM Discussed CT results with Dr. Ignacia Palma.  Dr. Ignacia Palma looked at the results and recommends having the patient admitted to medicine service. 7:52 PM Discussed with Dr. Lovell Sheehan with [facility]  She reports that she will come see patient in the Emergency Department.   Pascal Lux Beaulieu, PA-C 08/30/11 [phone]

## 2011-08-30 DIAGNOSIS — A088 Other specified intestinal infections: Secondary | ICD-10-CM | POA: Diagnosis not present

## 2011-08-30 DIAGNOSIS — F17200 Nicotine dependence, unspecified, uncomplicated: Secondary | ICD-10-CM | POA: Diagnosis not present

## 2011-08-30 DIAGNOSIS — R1084 Generalized abdominal pain: Secondary | ICD-10-CM | POA: Diagnosis not present

## 2011-08-30 DIAGNOSIS — F411 Generalized anxiety disorder: Secondary | ICD-10-CM

## 2011-08-30 DIAGNOSIS — D72829 Elevated white blood cell count, unspecified: Secondary | ICD-10-CM | POA: Diagnosis not present

## 2011-08-30 DIAGNOSIS — K591 Functional diarrhea: Secondary | ICD-10-CM | POA: Diagnosis not present

## 2011-08-30 DIAGNOSIS — R933 Abnormal findings on diagnostic imaging of other parts of digestive tract: Secondary | ICD-10-CM | POA: Diagnosis not present

## 2011-08-30 LAB — BASIC METABOLIC PANEL
Calcium: 9.2 mg/dL (ref 8.4–10.5)
Creatinine, Ser: 0.82 mg/dL (ref 0.50–1.35)
GFR calc Af Amer: >90 mL/min (ref >90)

## 2011-08-30 LAB — CBC
MCHC: 34.2 g/dL (ref 30.0–36.0)
MCV: 92.3 fL (ref 78.0–100.0)
Platelets: 237 10*3/uL (ref 150–400)
RDW: 14.1 % (ref 11.5–15.5)
WBC: 6.9 10*3/uL (ref 4.0–10.5)

## 2011-08-30 MED ORDER — ALPRAZOLAM 0.5 MG PO TABS
0.5000 mg | ORAL_TABLET | Freq: Three times a day (TID) | ORAL | Status: DC | PRN
  Administered 2011-08-30 – 2011-08-31 (×3): 0.5 mg via ORAL
  Filled 2011-08-30 (×3): qty 1

## 2011-08-30 NOTE — Consult Note (Signed)
Referring Provider: Dr. Marcellus Scott [facility]) Primary Care Physician:  Dr. Thyra Breed ([facility]) Primary Gastroenterologist:  None (unassigned)  Reason for Consultation:  Abdominal pain with radiographic evidence of enterocolitis  HPI: Brent Morales. is a 36 y.o. male admitted to the hospital yesterday following the abrupt onset of severe, stabbing pain in the left lower quadrant. He has a history of what sounds like a possible left inguinal hernia which had been checked recently by his pain clinic physician.  Because of the pain, he was brought yesterday to the hospital by EMS, and was admitted through the emergency room after a CT scan showed evidence of significant jejunal thickening and mesenteric adenitis.  The patient had minimal emesis of some clear fluid, and several watery yellow bowel movements. There was no evidence of blood in the emesis or stool, nor did he run any fevers.  Overnight, he has been treated with antibiotics and steroids, and he feels markedly better. In fact, he is now tolerating a solid diet.  Of note, there is no history of prodromal GI tract symptoms. He has never had an episode similar to this. No problem with anorexia or weight loss, constipation or diarrhea, rectal bleeding. He does experience occasional reflux symptoms for which he uses over-the-counter antacids or PPIs.   Past Medical History  Diagnosis Date  . Hernia     Bilateral Inguinal    Past Surgical History  Procedure Date  . Inguinal hernia repair   . Thumb surgery     Prior to Admission medications   Medication Sig Start Date End Date Taking? Authorizing Provider  ALPRAZolam Prudy Feeler) 0.5 MG tablet Take 0.5 mg by mouth 3 (three) times daily as needed. For anxiety   Yes Historical Provider, MD  HYDROcodone-acetaminophen (VICODIN) 5-500 MG per tablet Take 1 tablet by mouth every 6 (six) hours as needed. For pain   Yes Historical Provider, MD  oxycodone (OXY-IR) 5 MG capsule  Take 5 mg by mouth every 8 (eight) hours as needed. For pain   Yes Historical Provider, MD  Pseudoeph-Doxylamine-DM-APAP (NYQUIL) 60-7.08-07-998 MG/30ML LIQD Take by mouth at bedtime as needed. For cough   Yes Historical Provider, MD    Current Facility-Administered Medications  Medication Dose Route Frequency Provider Last Rate Last Dose  . 0.9 %  sodium chloride infusion   Intravenous Continuous Ron Parker, MD 125 mL/hr at 08/30/11 0746 1,000 mL at 08/30/11 0746  . acetaminophen (TYLENOL) tablet 650 mg  650 mg Oral Q6H PRN Ron Parker, MD       Or  . acetaminophen (TYLENOL) suppository 650 mg  650 mg Rectal Q6H PRN Ron Parker, MD      . ALPRAZolam Prudy Feeler) tablet 0.5 mg  0.5 mg Oral TID PRN Elease Etienne, MD      . alum & mag hydroxide-simeth (MAALOX/MYLANTA) 200-200-20 MG/5ML suspension 30 mL  30 mL Oral Q6H PRN Ron Parker, MD      . diphenoxylate-atropine (LOMOTIL) 2.5-0.025 MG per tablet 1 tablet  1 tablet Oral Once Magnus Sinning, PA-C   1 tablet at 08/29/11 1836  . enoxaparin (LOVENOX) injection 40 mg  40 mg Subcutaneous Q24H Ron Parker, MD   40 mg at 08/29/11 2101  . HYDROmorphone (DILAUDID) injection 0.5-1 mg  0.5-1 mg Intravenous Q3H PRN Ron Parker, MD   1 mg at 08/30/11 1435  . HYDROmorphone (DILAUDID) injection 1 mg  1 mg Intravenous Once Magnus Sinning, PA-C  1 mg at 08/29/11 1836  . iohexol (OMNIPAQUE) 300 MG/ML solution 100 mL  100 mL Intravenous Once PRN Medication Radiologist, MD   100 mL at 08/29/11 1652  . methylPREDNISolone sodium succinate (SOLU-MEDROL) 125 mg/2 mL injection 125 mg  125 mg Intravenous Q12H Ron Parker, MD   125 mg at 08/30/11 0929  . ondansetron (ZOFRAN) tablet 4 mg  4 mg Oral Q6H PRN Ron Parker, MD       Or  . ondansetron (ZOFRAN) injection 4 mg  4 mg Intravenous Q6H PRN Harvette Velora Heckler, MD      . oxyCODONE (Oxy IR/ROXICODONE) immediate release tablet 5 mg  5 mg Oral Q4H PRN  Ron Parker, MD      . zolpidem (AMBIEN) tablet 5 mg  5 mg Oral QHS PRN Ron Parker, MD      . DISCONTD: ciprofloxacin (CIPRO) IVPB 400 mg  400 mg Intravenous Q12H Ron Parker, MD   400 mg at 08/30/11 0824  . DISCONTD: diphenoxylate-atropine (LOMOTIL) 2.5-0.025 MG/5ML liquid 10 mL  10 mL Oral Once [employer] Wingen, PA-C      . DISCONTD: methylPREDNISolone sodium succinate (SOLU-MEDROL) 125 mg/2 mL injection 80 mg  80 mg Intravenous Once Ron Parker, MD      . DISCONTD: metroNIDAZOLE (FLAGYL) IVPB 500 mg  500 mg Intravenous Q8H Ron Parker, MD   500 mg at 08/30/11 1223    Allergies as of 08/29/2011 - Review Complete 08/29/2011  Allergen Reaction Noted  . Iohexol  04/10/2004    Family History  Problem Relation Age of Onset  . Hypertension Mother   . Heart disease Father   . Crohn's disease Maternal Aunt   . Colon cancer Paternal Grandfather     History   Social History  . Marital Status: Single    Spouse Name: N/A    Number of Children: N/A  . Years of Education: N/A   Occupational History  . Brent on file.   Social History Main Topics  . Smoking status: Current Everyday Smoker -- 2.0 packs/day for 25 years  . Smokeless tobacco: Brent on file  . Alcohol Use: No  . Drug Use: No  . Sexually Active: Brent on file   Other Topics Concern  . Brent on file   Social History Narrative  . No narrative on file    Review of Systems: See history of present illness.  Physical Exam: Vital signs in last 24 hours: Temp:  [97.9 F (36.6 C)-98.4 F (36.9 C)] 98.4 F (36.9 C) (06/21 1425) Pulse Rate:  [62-77] 77  (06/21 1425) Resp:  [16-18] 18  (06/21 1425) BP: (114-139)/(69-82) 122/82 mmHg (06/21 1425) SpO2:  [97 %-100 %] 99 % (06/21 1425) Last BM Date: 08/30/11 General:   Alert,  Well-developed, well-nourished, pleasant and cooperative in NAD Head:  Normocephalic and atraumatic. Eyes:  Sclera clear, no icterus.    Mouth:   No ulcerations or  lesions.  Oropharynx pink & moist. Neck:   No masses or thyromegaly. Lungs:  Clear throughout to auscultation.   No wheezes, crackles, or rhonchi. No evident respiratory distress. Heart:   Regular rate and rhythm; no murmurs, clicks, rubs,  or gallops. Abdomen:  Soft, nontender, mild scattered tympany, but nondistended. No masses, hepatosplenomegaly or ventral hernias noted. Normal bowel sounds, without bruits, guarding, or rebound.   Msk:   Symmetrical without gross deformities. Extremities:   Without clubbing, cyanosis, or edema. Neurologic:  Alert and coherent;  grossly normal neurologically. Skin:  Intact without significant lesions or rashes. His palms appear somewhat pale. Cervical Nodes:  No significant cervical adenopathy. Psych:   Alert and cooperative. Normal mood and affect.  Intake/Output from previous day:   Intake/Output this shift: Total I/O In: 2606.3 [P.O.:600; I.V.:2006.3] Out: -   Lab Results:  Basename 08/30/11 0640 08/29/11 1307  WBC 6.9 18.8*  HGB 13.9 16.1  HCT 40.7 45.2  PLT 237 287   BMET  Basename 08/30/11 0640 08/29/11 1307  NA 139 143  K 4.1 4.0  CL 107 107  CO2 24 24  GLUCOSE 136* 89  BUN 7 11  CREATININE 0.82 0.83  CALCIUM 9.2 10.0   LFT No results Morales for this basename: PROT,ALBUMIN,AST,ALT,ALKPHOS,BILITOT,BILIDIR,IBILI in the last 72 hours PT/INR No results Morales for this basename: LABPROT:2,INR:2 in the last 72 hours  C-Diff Negative  Studies/Results: Ct Abdomen Pelvis W Contrast  08/29/2011  *RADIOLOGY REPORT*  Clinical Data: Abdominal pain.  Nausea, vomiting and diarrhea.  CT ABDOMEN AND PELVIS WITH CONTRAST  Technique:  Multidetector CT imaging of the abdomen and pelvis was performed following the standard protocol during bolus administration of intravenous contrast.  Contrast: OMNIPAQUE IOHEXOL 300 MG/ML  SOLN  Comparison: CT abdomen and pelvis without contrast 08/05/2010.  Findings: The lung bases are clear without focal  nodule, mass, or airspace disease.  The heart size is normal.  No significant pleural or pericardial effusion is present.  The liver and spleen are within normal limits.  The stomach, duodenum, and pancreas are unremarkable.  The common bile duct and gallbladder are normal.  The gallbladder is mostly collapsed.  The adrenal glands are normal bilaterally.  The kidneys and ureters are unremarkable.  Urinary bladder is within normal limits.  It is mostly collapsed.  There is mild wall thickening throughout the colon.  This is most prominent in the proximal ascending and distal sigmoid colon. There is marked wall thickening of the jejunum with multiple mesenteric lymph nodes.  Contrast is seen throughout the remainder of the colon.  The terminal ileum is unremarkable.  There is free fluid in the right pericolic gutter extending into the pelvis.  The bone windows are unremarkable.  IMPRESSION:  1.  Marked wall thickening and mesenteric lymph nodes involving the proximal jejunum compatible with a nonspecific enteritis. 2.  Mild wall thickening throughout the colon with particular prominence in the ascending and distal sigmoid colon.  This raises the possibility of a more diffuse process such as Crohn's enterocolitis.  The terminal ileum is relatively normal, arguing against this. 3.  Free fluid in the right pericolic gutter extending into the anatomic pelvis is compatible with an acute inflammatory process.  Original Report Authenticated By: Jamesetta Orleans. MATTERN, M.D.    Impression: This appears to be an acute infectious community acquired viral enterocolitis, which is already starting to resolve  Plan: I would favor observation, in view of the abrupt onset of the patient's illness, and the rapid clinical improvement. I do Brent feel that colonoscopic evaluation is warranted, and indeed, the more dramatic radiographic abnormalities are actually in the small bowel rather than the colon so it is Brent clear that  colonoscopy would be very useful anyway.  I think he can safely have his steroids and antibiotics stopped, and I would also stop his IV fluids.    He can then be observed overnight to see whether resumption of any of that treatment is needed. If he does well, he could be discharged  tomorrow, without the need for further GI followup.   On the other hand, if he develops lingering symptoms, I would be happy to see him back in the office, in which case a trial of probiotic therapy and/or a small bowel capsule endoscopy might need to be considered.  I will follow the patient with you until it is clear that satisfactory resolution of his condition has occurred.   LOS: 1 day   Julie Paolini V  08/30/2011, 3:54 PM

## 2011-08-30 NOTE — ED Provider Notes (Signed)
Medical screening examination/treatment/procedure(s) were performed by non-physician practitioner and as supervising physician I was immediately available for consultation/collaboration.   Jaasiel Hollyfield III, MD 08/30/11 1312 

## 2011-08-30 NOTE — Progress Notes (Signed)
Subjective:   Chart reviewed. Patient indicates that he's feeling better than on admission. Diarrhea is decreasing. No blood or mucus in the stools. Left lower quadrant abdominal pain is better. No further nausea or vomiting. Tolerating by mouth intake. Denies prior history of IBD. His aunt has history of IBD. No history of eating out, traveling or recent antibiotic use. EGD 4-5 years ago revealed reflux. Goes to [facility]-Dr. Trudi Ida for pain management.  Objective  Vital signs in last 24 hours: Filed Vitals:   08/30/11 0214 08/30/11 0615 08/30/11 0937 08/30/11 1425  BP: 139/71 121/69 114/71 122/82  Pulse: 62 71 72 77  Temp: 98 F (36.7 C) 97.9 F (36.6 C) 98.2 F (36.8 C) 98.4 F (36.9 C)  TempSrc: Oral Oral Oral Oral  Resp: 18 18 18 18   SpO2: 98% 97% 100% 99%   Weight change:   Intake/Output Summary (Last 24 hours) at 08/30/11 1444 Last data filed at 08/30/11 1426  Gross per 24 hour  Intake 2606.25 ml  Output      0 ml  Net 2606.25 ml    Physical Exam:  General Exam: Comfortable.  Respiratory System: Clear. No increased work of breathing.  Cardiovascular System: First and second heart sounds heard. Regular rate and rhythm. No JVD/murmurs.  Gastrointestinal System: Abdomen is non distended, soft and normal bowel sounds heard. Mild tenderness in the left lower quadrant and left mid quadrant but without rigidity, rebound or guarding. Central Nervous System: Alert and oriented. No focal neurological deficits. Extremities: Symmetric 5 x 5 power.  Labs:  Basic Metabolic Panel:  Lab 08/30/11 0272 08/29/11 1307  NA 139 143  K 4.1 4.0  CL 107 107  CO2 24 24  GLUCOSE 136* 89  BUN 7 11  CREATININE 0.82 0.83  CALCIUM 9.2 10.0  ALB -- --  PHOS -- --   Liver Function Tests: No results found for this basename: AST:3,ALT:3,ALKPHOS:3,BILITOT:3,PROT:3,ALBUMIN:3 in the last 168 hours No results found for this basename: LIPASE:3,AMYLASE:3 in the last 168  hours No results found for this basename: AMMONIA:3 in the last 168 hours CBC:  Lab 08/30/11 0640 08/29/11 1307  WBC 6.9 18.8*  NEUTROABS -- 15.6*  HGB 13.9 16.1  HCT 40.7 45.2  MCV 92.3 90.6  PLT 237 287   Cardiac Enzymes: No results found for this basename: CKTOTAL:5,CKMB:5,CKMBINDEX:5,TROPONINI:5 in the last 168 hours CBG: No results found for this basename: GLUCAP:5 in the last 168 hours  Iron Studies: No results found for this basename: IRON,TIBC,TRANSFERRIN,FERRITIN in the last 72 hours Studies/Results: Ct Abdomen Pelvis W Contrast  08/29/2011  *RADIOLOGY REPORT*  Clinical Data: Abdominal pain.  Nausea, vomiting and diarrhea.  CT ABDOMEN AND PELVIS WITH CONTRAST  Technique:  Multidetector CT imaging of the abdomen and pelvis was performed following the standard protocol during bolus administration of intravenous contrast.  Contrast: OMNIPAQUE IOHEXOL 300 MG/ML  SOLN  Comparison: CT abdomen and pelvis without contrast 08/05/2010.  Findings: The lung bases are clear without focal nodule, mass, or airspace disease.  The heart size is normal.  No significant pleural or pericardial effusion is present.  The liver and spleen are within normal limits.  The stomach, duodenum, and pancreas are unremarkable.  The common bile duct and gallbladder are normal.  The gallbladder is mostly collapsed.  The adrenal glands are normal bilaterally.  The kidneys and ureters are unremarkable.  Urinary bladder is within normal limits.  It is mostly collapsed.  There is mild wall thickening throughout the  colon.  This is most prominent in the proximal ascending and distal sigmoid colon. There is marked wall thickening of the jejunum with multiple mesenteric lymph nodes.  Contrast is seen throughout the remainder of the colon.  The terminal ileum is unremarkable.  There is free fluid in the right pericolic gutter extending into the pelvis.  The bone windows are unremarkable.  IMPRESSION:  1.  Marked wall  thickening and mesenteric lymph nodes involving the proximal jejunum compatible with a nonspecific enteritis. 2.  Mild wall thickening throughout the colon with particular prominence in the ascending and distal sigmoid colon.  This raises the possibility of a more diffuse process such as Crohn's enterocolitis.  The terminal ileum is relatively normal, arguing against this. 3.  Free fluid in the right pericolic gutter extending into the anatomic pelvis is compatible with an acute inflammatory process.  Original Report Authenticated By: Jamesetta Orleans. MATTERN, M.D.   Medications:    . sodium chloride 1,000 mL (08/30/11 0746)      . ciprofloxacin  400 mg Intravenous Q12H  . diphenoxylate-atropine  1 tablet Oral Once  . enoxaparin  40 mg Subcutaneous Q24H  .  HYDROmorphone (DILAUDID) injection  1 mg Intravenous Once  . methylPREDNISolone (SOLU-MEDROL) injection  125 mg Intravenous Q12H  . methylPREDNISolone (SOLU-MEDROL) injection  80 mg Intravenous Once  . metronidazole  500 mg Intravenous Q8H  . DISCONTD: diphenoxylate-atropine  10 mL Oral Once    I  have reviewed scheduled and prn medications.     Problem/Plan: Principal Problem:  *Colitis, acute Active Problems:  Abdominal pain, acute, left lower quadrant  Leukocytosis  Diarrhea  Tobacco abuse disorder  1. Abdominal pain, nausea, vomiting and nonbloody diarrhea: Negative C. difficile PCR. CT scan shows wall thickening of the proximal jejunum, ascending colon and sigmoid colon. Gastroenterology consulted and recommend that patient probably has nonspecific viral gastroenteritis. They recommend discontinuing all antibiotics and steroids and monitoring. Continue IV fluids. Clinically better. 2. Leukocytosis: Secondary to problem #1. Resolved. 3. Tobacco abuse: Cessation counseled. 4. History of anxiety: Continue when necessary Xanax.  Discussed with patient and his father at the bedside (at patient's request) updated care and  answered questions. Discussed with Dr. Bernette Redbird.  Ashlley Booher 08/30/2011,2:44 PM  LOS: 1 day

## 2011-08-31 DIAGNOSIS — F411 Generalized anxiety disorder: Secondary | ICD-10-CM

## 2011-08-31 DIAGNOSIS — K591 Functional diarrhea: Secondary | ICD-10-CM | POA: Diagnosis not present

## 2011-08-31 DIAGNOSIS — F17200 Nicotine dependence, unspecified, uncomplicated: Secondary | ICD-10-CM

## 2011-08-31 DIAGNOSIS — D72829 Elevated white blood cell count, unspecified: Secondary | ICD-10-CM

## 2011-08-31 DIAGNOSIS — A088 Other specified intestinal infections: Secondary | ICD-10-CM

## 2011-08-31 DIAGNOSIS — R933 Abnormal findings on diagnostic imaging of other parts of digestive tract: Secondary | ICD-10-CM | POA: Diagnosis not present

## 2011-08-31 DIAGNOSIS — R1084 Generalized abdominal pain: Secondary | ICD-10-CM | POA: Diagnosis not present

## 2011-08-31 LAB — CBC
MCHC: 33.4 g/dL (ref 30.0–36.0)
Platelets: 219 10*3/uL (ref 150–400)
RDW: 14.5 % (ref 11.5–15.5)
WBC: 10.9 10*3/uL — ABNORMAL HIGH (ref 4.0–10.5)

## 2011-08-31 NOTE — Progress Notes (Signed)
GASTROENTEROLOGY PROGRESS NOTE  Problem:   Apparent infectious enterocolitis  Subjective: Feeling well. Feels ready to go home. No bowel movements. No nausea or vomiting. Tolerating solid diet.  Objective: Afebrile. White count minimally elevated at 10,900. Abdomen soft, without objective tenderness. No evident distress whatsoever.  Assessment: Resolving infectious enterocolitis. His minimal leukocytosis is probably a reflection of his recent steroid exposure.  Plan: Okay for discharge from my standpoint. The patient was advised eat a conservative diet, using common sense, to advance as tolerated.   Although followup with me in the office is not required, I gave him my card and encouraged him to call us if he has persistent or lingering symptoms.   I did suggest that he might want to try using a probiotic for a month or so, particularly if he notices some lingering alteration of bowel habit as a consequence of this recent illness ("postinfectious IBS " ). If he has acute symptoms, he should go to an urgent care center or the emergency room.   Florencia Reasons, M.D. 08/31/2011 2:20 PM

## 2011-08-31 NOTE — Progress Notes (Signed)
Patient was given discharge instructions.  Patient verbalized understanding of instructions given and will follow up with his PCP as soon as possible.  Patient left the hospital accompanied by his mother.  Roland Rack, RN

## 2011-08-31 NOTE — Discharge Summary (Signed)
Discharge Summary  Brent Morales. MR#: [id number]  DOB:05-22-1975  Date of Admission: 08/29/2011 Date of Discharge: 08/31/2011  Patient's PCP: No primary provider on file.  Attending Physician:Jeanmarie Mccowen  Consults: 1. Gastroenterology: Florencia Reasons, MD   Discharge Diagnoses: Principal Problem:  *Colitis, acute Active Problems:  Abdominal pain, acute, left lower quadrant  Leukocytosis  Diarrhea  Tobacco abuse disorder   Brief Admitting History and Physical 36 y.o. male admitted to the hospital yesterday following the abrupt onset of severe, stabbing pain in the left lower quadrant.Because of the pain, he was brought to the hospital by EMS, and was admitted through the emergency room after a CT scan showed evidence of significant jejunal thickening and mesenteric adenitis.The patient had minimal emesis of some clear fluid, and several watery yellow bowel movements. There was no evidence of blood in the emesis or stool, nor did he run any fevers. There is no history of prodromal GI tract symptoms. He has never had an episode similar to this. No problem with anorexia or weight loss, constipation or diarrhea, rectal bleeding. He does experience occasional reflux symptoms for which he uses over-the-counter antacids or PPIs.     Discharge Medications Current Discharge Medication List    CONTINUE these medications which have NOT CHANGED   Details  ALPRAZolam (XANAX) 0.5 MG tablet Take 0.5 mg by mouth 3 (three) times daily as needed. For anxiety    oxycodone (OXY-IR) 5 MG capsule Take 5 mg by mouth every 8 (eight) hours as needed. For pain      STOP taking these medications     HYDROcodone-acetaminophen (VICODIN) 5-500 MG per tablet Comments:  Reason for Stopping:       Pseudoeph-Doxylamine-DM-APAP (NYQUIL) 60-7.08-07-998 MG/30ML LIQD Comments:  Reason for Stopping:          Hospital Course: Colitis, acute Present on Admission:  .Colitis, acute .Abdominal pain,  acute, left lower quadrant .Leukocytosis .Diarrhea .Tobacco abuse disorder   1. Acute infectious community acquired viral enterocolitis: Patient was admitted to the hospital and started on IV fluids. C. difficile PCR was negative. Stool cultures were sent and are pending. He was initially started empirically on IV Cipro, Flagyl and Solu-Medrol. He has no prior history of similar symptoms or inflammatory bowel disease. Gastroenterology was consulted. They recommended discontinuing all antibiotics, steroids and IV fluids since patient was clinically better within the first 24 hours of admission. Same was done. Since then patient has continued to improve with no further nausea, vomiting or diarrhea. He is tolerating diet. He has been afebrile. Mild leukocytosis is probably secondary to steroid effect. His abdominal pain has reduced. He is advised to seek medical attention as outpatient if he has any recurrence or worsening of symptoms which he verbalizes understanding 2. 2. Leukocytosis: Secondary to problem #1 and steroids. Improved compared to admission. 3. Tobacco abuse: Cessation counseled. 4. History of anxiety: Continue prior home dose of Xanax. 5. History of pain: Patient sees a pain M.D. as outpatient. 6. Mild anemia: Probably dilutional. No history of bleeding. Outpatient followup as deemed necessary.   Day of Discharge  Complaints: No nausea, vomiting or diarrhea. Tolerating diet. Reduced abdominal pain.  Physical exam: BP 115/66  Pulse 76  Temp 98.6 F (37 C) (Oral)  Resp 18  SpO2 98% General Exam: Comfortable.  Respiratory System: Clear. No increased work of breathing.  Cardiovascular System: First and second heart sounds heard. Regular rate and rhythm. No JVD/murmurs.  Gastrointestinal System: Abdomen is non distended, soft and normal  bowel sounds heard. Nontender today. Central Nervous System: Alert and oriented. No focal neurological deficits.  Extremities: Symmetric 5 x 5  power  Basic Metabolic Panel:  Lab 08/30/11 3086 08/29/11 1307  NA 139 143  K 4.1 4.0  CL 107 107  CO2 24 24  GLUCOSE 136* 89  BUN 7 11  CREATININE 0.82 0.83  CALCIUM 9.2 10.0  ALB -- --  PHOS -- --   Liver Function Tests: No results found for this basename: AST:3,ALT:3,ALKPHOS:3,BILITOT:3,PROT:3,ALBUMIN:3 in the last 168 hours No results found for this basename: LIPASE:3,AMYLASE:3 in the last 168 hours No results found for this basename: AMMONIA:3 in the last 168 hours CBC:  Lab 08/31/11 0715 08/30/11 0640 08/29/11 1307  WBC 10.9* 6.9 18.8*  NEUTROABS -- -- 15.6*  HGB 12.8* 13.9 16.1  HCT 38.3* 40.7 45.2  MCV 93.4 92.3 90.6  PLT 219 237 287   Cardiac Enzymes: No results found for this basename: CKTOTAL:5,CKMB:5,CKMBINDEX:5,TROPONINI:5 in the last 168 hours CBG: No results found for this basename: GLUCAP:5 in the last 168 hours  Other lab data:  1. Urinalysis: Negative for features of urinary tract infection. 2. Stool C. difficile by PCR: Negative 3. Stool culture: Pending.   Ct Abdomen Pelvis W Contrast  08/29/2011  *RADIOLOGY REPORT*  Clinical Data: Abdominal pain.  Nausea, vomiting and diarrhea.  CT ABDOMEN AND PELVIS WITH CONTRAST  Technique:  Multidetector CT imaging of the abdomen and pelvis was performed following the standard protocol during bolus administration of intravenous contrast.  Contrast: OMNIPAQUE IOHEXOL 300 MG/ML  SOLN  Comparison: CT abdomen and pelvis without contrast 08/05/2010.  Findings: The lung bases are clear without focal nodule, mass, or airspace disease.  The heart size is normal.  No significant pleural or pericardial effusion is present.  The liver and spleen are within normal limits.  The stomach, duodenum, and pancreas are unremarkable.  The common bile duct and gallbladder are normal.  The gallbladder is mostly collapsed.  The adrenal glands are normal bilaterally.  The kidneys and ureters are unremarkable.  Urinary bladder is  within normal limits.  It is mostly collapsed.  There is mild wall thickening throughout the colon.  This is most prominent in the proximal ascending and distal sigmoid colon. There is marked wall thickening of the jejunum with multiple mesenteric lymph nodes.  Contrast is seen throughout the remainder of the colon.  The terminal ileum is unremarkable.  There is free fluid in the right pericolic gutter extending into the pelvis.  The bone windows are unremarkable.  IMPRESSION:  1.  Marked wall thickening and mesenteric lymph nodes involving the proximal jejunum compatible with a nonspecific enteritis. 2.  Mild wall thickening throughout the colon with particular prominence in the ascending and distal sigmoid colon.  This raises the possibility of a more diffuse process such as Crohn's enterocolitis.  The terminal ileum is relatively normal, arguing against this. 3.  Free fluid in the right pericolic gutter extending into the anatomic pelvis is compatible with an acute inflammatory process.  Original Report Authenticated By: Jamesetta Orleans. MATTERN, M.D.     Disposition: Discharged home in stable condition.  Diet: Regular diet.  Activity: Increase activity gradually.   Follow-up Appts: Discharge Orders    Future Appointments: Provider: Department: Dept Phone: Center:   10/08/2011 10:10 AM Maisie Fus A. Cornett, MD [facility] [phone] None     Future Orders Please Complete By Expires   Diet general      Increase activity slowly  Call MD for:  temperature >100.4      Call MD for:  persistant nausea and vomiting      Call MD for:  severe uncontrolled pain      Call MD for:      Comments:   Diarrhea.      TESTS THAT NEED FOLLOW-UP None  Time spent on discharge, talking to the patient, and coordinating care: 30 mins.   SignedMarcellus Scott, MD 08/31/2011, 1:58 PM

## 2011-09-02 LAB — STOOL CULTURE

## 2011-09-06 DIAGNOSIS — M47812 Spondylosis without myelopathy or radiculopathy, cervical region: Secondary | ICD-10-CM | POA: Diagnosis not present

## 2011-09-06 DIAGNOSIS — R109 Unspecified abdominal pain: Secondary | ICD-10-CM | POA: Diagnosis not present

## 2011-09-06 DIAGNOSIS — F411 Generalized anxiety disorder: Secondary | ICD-10-CM | POA: Diagnosis not present

## 2011-10-04 DIAGNOSIS — M47812 Spondylosis without myelopathy or radiculopathy, cervical region: Secondary | ICD-10-CM | POA: Diagnosis not present

## 2011-10-04 DIAGNOSIS — R109 Unspecified abdominal pain: Secondary | ICD-10-CM | POA: Diagnosis not present

## 2011-10-08 ENCOUNTER — Encounter (INDEPENDENT_AMBULATORY_CARE_PROVIDER_SITE_OTHER): Payer: Self-pay | Admitting: Surgery

## 2011-10-08 ENCOUNTER — Ambulatory Visit (INDEPENDENT_AMBULATORY_CARE_PROVIDER_SITE_OTHER): Payer: Medicare Other | Admitting: Surgery

## 2011-10-08 VITALS — BP 132/90 | HR 84 | Temp 97.6°F | Resp 16 | Ht 71.0 in | Wt 165.0 lb

## 2011-10-08 DIAGNOSIS — R109 Unspecified abdominal pain: Secondary | ICD-10-CM | POA: Diagnosis not present

## 2011-10-08 DIAGNOSIS — R1030 Lower abdominal pain, unspecified: Secondary | ICD-10-CM

## 2011-10-08 NOTE — Progress Notes (Signed)
Subjective:     Patient ID: Brent Bullocks., male   DOB: October 16, 1975, 36 y.o.   MRN: [id number]  HPI Brent Morales return to clinic. He is status post laparoscopic bilateral inguinal hernia. Mesh 2 years ago. He is a chronic left groin pain ever since. He continues to smoke. He has been worked up extensively with imaging and exam and has no evidence of recurrence. He is now care for a pain clinic. He has no primary care doctor. He continues to smoke. He continues to have left groin pain. He was seen last month in the hospital due to nausea, vomiting abdominal pain and underwent a CT scan which showed signs of colitis. He also is a chronic leukocytosis has not been worked up. He complains of left groin pain. Pain is made worse with exertion. A sharp in nature. It comes and goes.   Review of Systems  Constitutional: Positive for fatigue.  HENT: Negative.   Eyes: Negative.   Respiratory: Positive for cough.   Gastrointestinal: Positive for abdominal distention.  Genitourinary: Negative.   Musculoskeletal: Positive for back pain and arthralgias.       Objective:   Physical Exam  Constitutional: He is oriented to person, place, and time. He appears well-developed and well-nourished.  HENT:  Head: Normocephalic and atraumatic.  Eyes: EOM are normal. Pupils are equal, round, and reactive to light.  Genitourinary: Penis normal.     Musculoskeletal: Normal range of motion.  Neurological: He is alert and oriented to person, place, and time.  Skin: Skin is warm and dry.  Psychiatric: He has a normal mood and affect. His behavior is normal. Judgment and thought content normal.       Assessment:     Inguinodynia left  Tobacco abuse  Leukocytosis chronic    Plan:     Unfortunately I have nothing to offer him. He has no evidence of recurrent hernia therefore is not a surgical candidate. He continues to smoke and I think this is exacerbating his pain and I strongly encouraged him to stop  smoking. He also has a chronic leukocytosis which never been worked up. He has no primary care doctor that I know of he states he does not have one therefore will make referral for internal medicine so he has a doctor relationship to help follow him and to help stop smoking. Return to clinic as needed.

## 2011-10-08 NOTE — Patient Instructions (Signed)
You need a medical doctor.  Will try to arrange at [facility].  No evidence of hernia on   X ray or exam.  QUIT SMOKING.

## 2011-10-09 ENCOUNTER — Other Ambulatory Visit (INDEPENDENT_AMBULATORY_CARE_PROVIDER_SITE_OTHER): Payer: Self-pay

## 2011-10-09 DIAGNOSIS — D72829 Elevated white blood cell count, unspecified: Secondary | ICD-10-CM

## 2011-11-01 DIAGNOSIS — M47812 Spondylosis without myelopathy or radiculopathy, cervical region: Secondary | ICD-10-CM | POA: Diagnosis not present

## 2011-11-08 DIAGNOSIS — F411 Generalized anxiety disorder: Secondary | ICD-10-CM | POA: Diagnosis not present

## 2011-11-08 DIAGNOSIS — M47812 Spondylosis without myelopathy or radiculopathy, cervical region: Secondary | ICD-10-CM | POA: Diagnosis not present

## 2011-11-15 DIAGNOSIS — F411 Generalized anxiety disorder: Secondary | ICD-10-CM | POA: Diagnosis not present

## 2011-11-15 DIAGNOSIS — M47812 Spondylosis without myelopathy or radiculopathy, cervical region: Secondary | ICD-10-CM | POA: Diagnosis not present

## 2011-11-22 DIAGNOSIS — F411 Generalized anxiety disorder: Secondary | ICD-10-CM | POA: Diagnosis not present

## 2011-11-22 DIAGNOSIS — M47812 Spondylosis without myelopathy or radiculopathy, cervical region: Secondary | ICD-10-CM | POA: Diagnosis not present

## 2011-11-29 DIAGNOSIS — F411 Generalized anxiety disorder: Secondary | ICD-10-CM | POA: Diagnosis not present

## 2011-11-29 DIAGNOSIS — M47812 Spondylosis without myelopathy or radiculopathy, cervical region: Secondary | ICD-10-CM | POA: Diagnosis not present

## 2011-12-05 DIAGNOSIS — R911 Solitary pulmonary nodule: Secondary | ICD-10-CM | POA: Diagnosis not present

## 2011-12-05 DIAGNOSIS — F17200 Nicotine dependence, unspecified, uncomplicated: Secondary | ICD-10-CM | POA: Diagnosis not present

## 2011-12-05 DIAGNOSIS — R05 Cough: Secondary | ICD-10-CM | POA: Diagnosis not present

## 2011-12-05 DIAGNOSIS — F329 Major depressive disorder, single episode, unspecified: Secondary | ICD-10-CM | POA: Diagnosis not present

## 2011-12-05 DIAGNOSIS — F411 Generalized anxiety disorder: Secondary | ICD-10-CM | POA: Diagnosis not present

## 2011-12-05 DIAGNOSIS — M5020 Other cervical disc displacement, unspecified cervical region: Secondary | ICD-10-CM | POA: Diagnosis not present

## 2011-12-05 DIAGNOSIS — D72829 Elevated white blood cell count, unspecified: Secondary | ICD-10-CM | POA: Diagnosis not present

## 2011-12-06 DIAGNOSIS — F411 Generalized anxiety disorder: Secondary | ICD-10-CM | POA: Diagnosis not present

## 2011-12-06 DIAGNOSIS — M47812 Spondylosis without myelopathy or radiculopathy, cervical region: Secondary | ICD-10-CM | POA: Diagnosis not present

## 2011-12-19 DIAGNOSIS — M47812 Spondylosis without myelopathy or radiculopathy, cervical region: Secondary | ICD-10-CM | POA: Diagnosis not present

## 2012-01-02 DIAGNOSIS — F411 Generalized anxiety disorder: Secondary | ICD-10-CM | POA: Diagnosis not present

## 2012-01-02 DIAGNOSIS — M47812 Spondylosis without myelopathy or radiculopathy, cervical region: Secondary | ICD-10-CM | POA: Diagnosis not present

## 2012-01-16 DIAGNOSIS — F411 Generalized anxiety disorder: Secondary | ICD-10-CM | POA: Diagnosis not present

## 2012-01-16 DIAGNOSIS — M47812 Spondylosis without myelopathy or radiculopathy, cervical region: Secondary | ICD-10-CM | POA: Diagnosis not present

## 2012-01-30 DIAGNOSIS — M47812 Spondylosis without myelopathy or radiculopathy, cervical region: Secondary | ICD-10-CM | POA: Diagnosis not present

## 2012-01-30 DIAGNOSIS — F411 Generalized anxiety disorder: Secondary | ICD-10-CM | POA: Diagnosis not present

## 2012-02-13 DIAGNOSIS — F411 Generalized anxiety disorder: Secondary | ICD-10-CM | POA: Diagnosis not present

## 2012-02-13 DIAGNOSIS — M47812 Spondylosis without myelopathy or radiculopathy, cervical region: Secondary | ICD-10-CM | POA: Diagnosis not present

## 2012-06-17 DIAGNOSIS — F411 Generalized anxiety disorder: Secondary | ICD-10-CM | POA: Diagnosis not present

## 2012-06-17 DIAGNOSIS — M47812 Spondylosis without myelopathy or radiculopathy, cervical region: Secondary | ICD-10-CM | POA: Diagnosis not present

## 2012-07-16 DIAGNOSIS — M47812 Spondylosis without myelopathy or radiculopathy, cervical region: Secondary | ICD-10-CM | POA: Diagnosis not present

## 2012-07-16 DIAGNOSIS — F411 Generalized anxiety disorder: Secondary | ICD-10-CM | POA: Diagnosis not present

## 2012-08-13 DIAGNOSIS — F411 Generalized anxiety disorder: Secondary | ICD-10-CM | POA: Diagnosis not present

## 2012-08-13 DIAGNOSIS — G894 Chronic pain syndrome: Secondary | ICD-10-CM | POA: Diagnosis not present

## 2012-08-13 DIAGNOSIS — M47812 Spondylosis without myelopathy or radiculopathy, cervical region: Secondary | ICD-10-CM | POA: Diagnosis not present

## 2012-09-08 DIAGNOSIS — G894 Chronic pain syndrome: Secondary | ICD-10-CM | POA: Diagnosis not present

## 2012-09-08 DIAGNOSIS — M47812 Spondylosis without myelopathy or radiculopathy, cervical region: Secondary | ICD-10-CM | POA: Diagnosis not present

## 2012-09-08 DIAGNOSIS — Z79899 Other long term (current) drug therapy: Secondary | ICD-10-CM | POA: Diagnosis not present

## 2012-09-08 DIAGNOSIS — F411 Generalized anxiety disorder: Secondary | ICD-10-CM | POA: Diagnosis not present

## 2012-10-06 DIAGNOSIS — G894 Chronic pain syndrome: Secondary | ICD-10-CM | POA: Diagnosis not present

## 2012-10-06 DIAGNOSIS — M47812 Spondylosis without myelopathy or radiculopathy, cervical region: Secondary | ICD-10-CM | POA: Diagnosis not present

## 2012-10-06 DIAGNOSIS — Z79899 Other long term (current) drug therapy: Secondary | ICD-10-CM | POA: Diagnosis not present

## 2012-11-03 DIAGNOSIS — M47812 Spondylosis without myelopathy or radiculopathy, cervical region: Secondary | ICD-10-CM | POA: Diagnosis not present

## 2012-11-03 DIAGNOSIS — F411 Generalized anxiety disorder: Secondary | ICD-10-CM | POA: Diagnosis not present

## 2012-11-03 DIAGNOSIS — Z79899 Other long term (current) drug therapy: Secondary | ICD-10-CM | POA: Diagnosis not present

## 2012-11-03 DIAGNOSIS — G894 Chronic pain syndrome: Secondary | ICD-10-CM | POA: Diagnosis not present

## 2012-12-01 DIAGNOSIS — M47812 Spondylosis without myelopathy or radiculopathy, cervical region: Secondary | ICD-10-CM | POA: Diagnosis not present

## 2012-12-01 DIAGNOSIS — F411 Generalized anxiety disorder: Secondary | ICD-10-CM | POA: Diagnosis not present

## 2012-12-01 DIAGNOSIS — G894 Chronic pain syndrome: Secondary | ICD-10-CM | POA: Diagnosis not present

## 2012-12-01 DIAGNOSIS — Z79899 Other long term (current) drug therapy: Secondary | ICD-10-CM | POA: Diagnosis not present

## 2012-12-29 DIAGNOSIS — G894 Chronic pain syndrome: Secondary | ICD-10-CM | POA: Diagnosis not present

## 2012-12-29 DIAGNOSIS — Z79899 Other long term (current) drug therapy: Secondary | ICD-10-CM | POA: Diagnosis not present

## 2012-12-29 DIAGNOSIS — M47812 Spondylosis without myelopathy or radiculopathy, cervical region: Secondary | ICD-10-CM | POA: Diagnosis not present

## 2013-01-26 DIAGNOSIS — G894 Chronic pain syndrome: Secondary | ICD-10-CM | POA: Diagnosis not present

## 2013-01-26 DIAGNOSIS — F411 Generalized anxiety disorder: Secondary | ICD-10-CM | POA: Diagnosis not present

## 2013-01-26 DIAGNOSIS — M47812 Spondylosis without myelopathy or radiculopathy, cervical region: Secondary | ICD-10-CM | POA: Diagnosis not present

## 2013-01-26 DIAGNOSIS — Z79899 Other long term (current) drug therapy: Secondary | ICD-10-CM | POA: Diagnosis not present

## 2013-02-23 DIAGNOSIS — G894 Chronic pain syndrome: Secondary | ICD-10-CM | POA: Diagnosis not present

## 2013-02-23 DIAGNOSIS — Z79899 Other long term (current) drug therapy: Secondary | ICD-10-CM | POA: Diagnosis not present

## 2013-02-23 DIAGNOSIS — M47812 Spondylosis without myelopathy or radiculopathy, cervical region: Secondary | ICD-10-CM | POA: Diagnosis not present

## 2013-02-23 DIAGNOSIS — F411 Generalized anxiety disorder: Secondary | ICD-10-CM | POA: Diagnosis not present

## 2013-03-01 ENCOUNTER — Emergency Department (HOSPITAL_COMMUNITY): Payer: Medicare Other

## 2013-03-01 ENCOUNTER — Inpatient Hospital Stay (HOSPITAL_COMMUNITY)
Admission: EM | Admit: 2013-03-01 | Discharge: 2013-03-04 | DRG: 440 | Disposition: A | Discharge status: Home / Self Care | Payer: Medicare Other | Attending: Internal Medicine | Admitting: Internal Medicine

## 2013-03-01 ENCOUNTER — Encounter (HOSPITAL_COMMUNITY): Payer: Self-pay | Admitting: Emergency Medicine

## 2013-03-01 DIAGNOSIS — R112 Nausea with vomiting, unspecified: Secondary | ICD-10-CM | POA: Diagnosis present

## 2013-03-01 DIAGNOSIS — K859 Acute pancreatitis without necrosis or infection, unspecified: Principal | ICD-10-CM | POA: Diagnosis present

## 2013-03-01 DIAGNOSIS — F1921 Other psychoactive substance dependence, in remission: Secondary | ICD-10-CM | POA: Diagnosis present

## 2013-03-01 DIAGNOSIS — Z8249 Family history of ischemic heart disease and other diseases of the circulatory system: Secondary | ICD-10-CM

## 2013-03-01 DIAGNOSIS — S61409A Unspecified open wound of unspecified hand, initial encounter: Secondary | ICD-10-CM

## 2013-03-01 DIAGNOSIS — Z9119 Patient's noncompliance with other medical treatment and regimen: Secondary | ICD-10-CM

## 2013-03-01 DIAGNOSIS — R1084 Generalized abdominal pain: Secondary | ICD-10-CM | POA: Diagnosis not present

## 2013-03-01 DIAGNOSIS — M545 Low back pain, unspecified: Secondary | ICD-10-CM | POA: Diagnosis present

## 2013-03-01 DIAGNOSIS — F17200 Nicotine dependence, unspecified, uncomplicated: Secondary | ICD-10-CM | POA: Diagnosis present

## 2013-03-01 DIAGNOSIS — R1012 Left upper quadrant pain: Secondary | ICD-10-CM | POA: Diagnosis present

## 2013-03-01 DIAGNOSIS — Z720 Tobacco use: Secondary | ICD-10-CM | POA: Diagnosis present

## 2013-03-01 DIAGNOSIS — M542 Cervicalgia: Secondary | ICD-10-CM | POA: Diagnosis not present

## 2013-03-01 DIAGNOSIS — Z8619 Personal history of other infectious and parasitic diseases: Secondary | ICD-10-CM

## 2013-03-01 DIAGNOSIS — D72829 Elevated white blood cell count, unspecified: Secondary | ICD-10-CM

## 2013-03-01 DIAGNOSIS — M4802 Spinal stenosis, cervical region: Secondary | ICD-10-CM | POA: Diagnosis present

## 2013-03-01 DIAGNOSIS — R109 Unspecified abdominal pain: Secondary | ICD-10-CM | POA: Diagnosis present

## 2013-03-01 DIAGNOSIS — R300 Dysuria: Secondary | ICD-10-CM | POA: Diagnosis not present

## 2013-03-01 DIAGNOSIS — F411 Generalized anxiety disorder: Secondary | ICD-10-CM | POA: Diagnosis present

## 2013-03-01 DIAGNOSIS — G8929 Other chronic pain: Secondary | ICD-10-CM | POA: Diagnosis present

## 2013-03-01 DIAGNOSIS — F1920 Other psychoactive substance dependence, uncomplicated: Secondary | ICD-10-CM | POA: Diagnosis present

## 2013-03-01 DIAGNOSIS — F329 Major depressive disorder, single episode, unspecified: Secondary | ICD-10-CM

## 2013-03-01 DIAGNOSIS — N509 Disorder of male genital organs, unspecified: Secondary | ICD-10-CM | POA: Diagnosis not present

## 2013-03-01 DIAGNOSIS — R1032 Left lower quadrant pain: Secondary | ICD-10-CM

## 2013-03-01 DIAGNOSIS — Z800 Family history of malignant neoplasm of digestive organs: Secondary | ICD-10-CM

## 2013-03-01 LAB — COMPREHENSIVE METABOLIC PANEL
ALT: 11 U/L (ref 0–53)
Albumin: 4 g/dL (ref 3.5–5.2)
Alkaline Phosphatase: 108 U/L (ref 39–117)
BUN: 5 mg/dL — ABNORMAL LOW (ref 6–23)
Chloride: 104 mEq/L (ref 96–112)
GFR calc Af Amer: >90 mL/min (ref >90)
Glucose, Bld: 99 mg/dL (ref 70–99)
Potassium: 3.7 mEq/L (ref 3.5–5.1)
Sodium: 141 mEq/L (ref 135–145)
Total Bilirubin: 0.3 mg/dL (ref 0.3–1.2)
Total Protein: 7.4 g/dL (ref 6.0–8.3)

## 2013-03-01 LAB — CG4 I-STAT (LACTIC ACID): Lactic Acid, Venous: 2.01 mmol/L (ref 0.5–2.2)

## 2013-03-01 LAB — URINALYSIS, ROUTINE W REFLEX MICROSCOPIC
Bilirubin Urine: NEGATIVE
Glucose, UA: NEGATIVE mg/dL
Hgb urine dipstick: NEGATIVE
Specific Gravity, Urine: 1.041 — ABNORMAL HIGH (ref 1.005–1.030)
Urobilinogen, UA: 0.2 mg/dL (ref 0.0–1.0)

## 2013-03-01 LAB — CBC WITH DIFFERENTIAL/PLATELET
Hemoglobin: 15 g/dL (ref 13.0–17.0)
Lymphs Abs: 1.7 10*3/uL (ref 0.7–4.0)
Monocytes Relative: 6 % (ref 3–12)
Neutro Abs: 8.7 10*3/uL — ABNORMAL HIGH (ref 1.7–7.7)
Neutrophils Relative %: 78 % — ABNORMAL HIGH (ref 43–77)
Platelets: 238 10*3/uL (ref 150–400)
RBC: 4.75 MIL/uL (ref 4.22–5.81)
WBC: 11.2 10*3/uL — ABNORMAL HIGH (ref 4.0–10.5)

## 2013-03-01 MED ORDER — MORPHINE SULFATE 4 MG/ML IJ SOLN
4.0000 mg | Freq: Once | INTRAMUSCULAR | Status: AC
  Administered 2013-03-01: 4 mg via INTRAVENOUS
  Filled 2013-03-01: qty 1

## 2013-03-01 MED ORDER — OXYCODONE-ACETAMINOPHEN 5-325 MG PO TABS
1.0000 | ORAL_TABLET | Freq: Four times a day (QID) | ORAL | Status: DC | PRN
  Administered 2013-03-01 – 2013-03-02 (×3): 1 via ORAL
  Filled 2013-03-01 (×3): qty 1

## 2013-03-01 MED ORDER — HYDROMORPHONE HCL PF 1 MG/ML IJ SOLN
1.0000 mg | INTRAMUSCULAR | Status: DC | PRN
  Administered 2013-03-01 – 2013-03-02 (×5): 1 mg via INTRAVENOUS
  Filled 2013-03-01 (×5): qty 1

## 2013-03-01 MED ORDER — SODIUM CHLORIDE 0.9 % IV SOLN
INTRAVENOUS | Status: DC
  Administered 2013-03-01 – 2013-03-02 (×3): via INTRAVENOUS
  Administered 2013-03-03: 150 mL/h via INTRAVENOUS

## 2013-03-01 MED ORDER — SODIUM CHLORIDE 0.9 % IV SOLN: 1000.0000 mL | INTRAVENOUS | Status: DC

## 2013-03-01 MED ORDER — IOHEXOL 300 MG/ML  SOLN
100.0000 mL | Freq: Once | INTRAMUSCULAR | Status: AC | PRN
  Administered 2013-03-01: 100 mL via INTRAVENOUS

## 2013-03-01 MED ORDER — HYDROMORPHONE HCL PF 1 MG/ML IJ SOLN
1.0000 mg | Freq: Once | INTRAMUSCULAR | Status: AC
  Administered 2013-03-01: 1 mg via INTRAVENOUS
  Filled 2013-03-01: qty 1

## 2013-03-01 MED ORDER — ONDANSETRON HCL 4 MG/2ML IJ SOLN
4.0000 mg | Freq: Once | INTRAMUSCULAR | Status: AC
  Administered 2013-03-01: 4 mg via INTRAVENOUS
  Filled 2013-03-01: qty 2

## 2013-03-01 MED ORDER — ONDANSETRON HCL 4 MG/2ML IJ SOLN: 4.0000 mg | Freq: Four times a day (QID) | INTRAMUSCULAR | Status: DC | PRN

## 2013-03-01 MED ORDER — OXYCODONE HCL 5 MG PO TABS
2.5000 mg | ORAL_TABLET | Freq: Four times a day (QID) | ORAL | Status: DC | PRN
  Administered 2013-03-01 – 2013-03-02 (×3): 2.5 mg via ORAL
  Filled 2013-03-01 (×3): qty 1

## 2013-03-01 MED ORDER — OXYCODONE-ACETAMINOPHEN 7.5-325 MG PO TABS: 1.0000 | ORAL_TABLET | Freq: Four times a day (QID) | ORAL | Status: DC | PRN

## 2013-03-01 MED ORDER — ONDANSETRON HCL 4 MG/2ML IJ SOLN
4.0000 mg | Freq: Four times a day (QID) | INTRAMUSCULAR | Status: DC | PRN
  Administered 2013-03-01: 4 mg via INTRAVENOUS
  Filled 2013-03-01: qty 2

## 2013-03-01 MED ORDER — ACETAMINOPHEN 325 MG PO TABS: 650.0000 mg | ORAL_TABLET | Freq: Four times a day (QID) | ORAL | Status: DC | PRN

## 2013-03-01 MED ORDER — ENOXAPARIN SODIUM 40 MG/0.4ML ~~LOC~~ SOLN
40.0000 mg | SUBCUTANEOUS | Status: DC
  Administered 2013-03-01 – 2013-03-03 (×3): 40 mg via SUBCUTANEOUS
  Filled 2013-03-01 (×4): qty 0.4

## 2013-03-01 MED ORDER — PANTOPRAZOLE SODIUM 40 MG IV SOLR
40.0000 mg | Freq: Two times a day (BID) | INTRAVENOUS | Status: DC
  Administered 2013-03-01 – 2013-03-04 (×6): 40 mg via INTRAVENOUS
  Filled 2013-03-01 (×7): qty 40

## 2013-03-01 MED ORDER — SODIUM CHLORIDE 0.9 % IV SOLN
1000.0000 mL | Freq: Once | INTRAVENOUS | Status: AC
  Administered 2013-03-01: 1000 mL via INTRAVENOUS

## 2013-03-01 MED ORDER — FENTANYL CITRATE 0.05 MG/ML IJ SOLN
50.0000 ug | Freq: Once | INTRAMUSCULAR | Status: DC
  Filled 2013-03-01: qty 2

## 2013-03-01 MED ORDER — KETOROLAC TROMETHAMINE 30 MG/ML IJ SOLN
30.0000 mg | Freq: Once | INTRAMUSCULAR | Status: AC
  Administered 2013-03-01: 30 mg via INTRAVENOUS
  Filled 2013-03-01: qty 1

## 2013-03-01 MED ORDER — ALPRAZOLAM 0.5 MG PO TABS
0.5000 mg | ORAL_TABLET | Freq: Three times a day (TID) | ORAL | Status: DC | PRN
  Administered 2013-03-01 – 2013-03-04 (×8): 0.5 mg via ORAL
  Filled 2013-03-01 (×8): qty 1

## 2013-03-01 NOTE — ED Provider Notes (Signed)
CSN: [id number]     Arrival date & time 03/01/13  1142 History   First MD Initiated Contact with Patient 03/01/13 1319     Chief Complaint  Patient presents with  . Abdominal Pain  . Emesis  . Testicle Pain  . Back Pain   (Consider location/radiation/quality/duration/timing/severity/associated sxs/prior Treatment) HPI  This a 37 yo male with history of bilateral inguinal hernia status post repair who presents with abdominal pain of the lower abdomen that radiates into the left testicle. Patient states he woke up this morning with crampy lower abdominal pain. He currently rates his pain a 10 out of 10. He reports nonbilious, nonbloody emesis.  He denies any diarrhea. Patient has had prior presentations like this in the past. Last normal bowel movement was yesterday.   Past Medical History  Diagnosis Date  . Hernia     Bilateral Inguinal   Past Surgical History  Procedure Laterality Date  . Inguinal hernia repair    . Thumb surgery     Family History  Problem Relation Age of Onset  . Hypertension Mother   . Heart disease Father   . Crohn's disease Maternal Aunt   . Colon cancer Paternal Grandfather    History  Substance Use Topics  . Smoking status: Current Every Day Smoker -- 1.00 packs/day for 25 years  . Smokeless tobacco: Never Used  . Alcohol Use: No    Review of Systems  Constitutional: Negative.  Negative for fever.  Respiratory: Negative.  Negative for chest tightness and shortness of breath.   Cardiovascular: Negative.  Negative for chest pain.  Gastrointestinal: Positive for nausea, vomiting and abdominal pain. Negative for diarrhea and constipation.  Genitourinary: Positive for dysuria and testicular pain.  Musculoskeletal: Negative for back pain.  Skin: Negative for rash.  Neurological: Negative for headaches.  All other systems reviewed and are negative.    Allergies  Iohexol  Home Medications   No current outpatient prescriptions on file. BP  142/93  Pulse 70  Temp(Src) 98.1 F (36.7 C) (Oral)  Resp 16  Ht 5\' 10"  (1.778 m)  Wt 173 lb 14.4 oz (78.881 kg)  BMI 24.95 kg/m2  SpO2 100% Physical Exam  Nursing note and vitals reviewed. Constitutional: He is oriented to person, place, and time. No distress.  Uncomfortable appearing, no acute distress  HENT:  Head: Normocephalic and atraumatic.  Eyes: Pupils are equal, round, and reactive to light.  Neck: Neck supple.  Cardiovascular: Normal rate, regular rhythm and normal heart sounds.   No murmur heard. Pulmonary/Chest: Effort normal and breath sounds normal. No respiratory distress. He has no wheezes.  Abdominal: Soft. Bowel sounds are normal. There is tenderness. There is no rebound and no guarding.  Genitourinary:  No enlargement noted of the left testicle, tenderness to palpation of the epididymis, no hernia noted  Musculoskeletal: He exhibits no edema.  Lymphadenopathy:    He has no cervical adenopathy.  Neurological: He is alert and oriented to person, place, and time.  Skin: Skin is warm and dry.  Psychiatric: He has a normal mood and affect.    ED Course  Procedures (including critical care time) Labs Review Labs Reviewed  CBC WITH DIFFERENTIAL - Abnormal; Notable for the following:    WBC 11.2 (*)    Neutrophils Relative % 78 (*)    Neutro Abs 8.7 (*)    All other components within normal limits  COMPREHENSIVE METABOLIC PANEL - Abnormal; Notable for the following:    BUN 5 (*)  All other components within normal limits  LIPASE, BLOOD - Abnormal; Notable for the following:    Lipase 157 (*)    All other components within normal limits  URINALYSIS, ROUTINE W REFLEX MICROSCOPIC - Abnormal; Notable for the following:    Specific Gravity, Urine 1.041 (*)    All other components within normal limits  CBC  BASIC METABOLIC PANEL  IGG 1, 2, 3, AND 4  TRIGLYCERIDES  CG4 I-STAT (LACTIC ACID)   Imaging Review US Scrotum  03/01/2013   CLINICAL DATA:   Scrotal pain.  EXAM: SCROTAL ULTRASOUND  DOPPLER ULTRASOUND OF THE TESTICLES  TECHNIQUE: Complete ultrasound examination of the testicles, epididymis, and other scrotal structures was performed. Color and spectral Doppler ultrasound were also utilized to evaluate blood flow to the testicles.  COMPARISON:  CT 03/01/2013  FINDINGS: Right testicle  Measurements: 4.7 x 2.5 x 2.9 cm. No mass or microlithiasis visualized.  Left testicle  Measurements: 4.5 x 2.1 x 2.9 cm. No mass or microlithiasis visualized.  Right epididymis:  Normal in size and appearance.  Left epididymis:  Normal in size and appearance.  Hydrocele:  Small bilateral  Varicocele:  None visualized.  Pulsed Doppler interrogation of both testes demonstrates low resistance arterial and venous waveforms bilaterally.  IMPRESSION: Unremarkable study.   Electronically Signed   By: Charlett Nose M.D.   On: 03/01/2013 16:28   Ct Abdomen Pelvis W Contrast  03/01/2013   CLINICAL DATA:  History of inguinal hernia repair. Generalized abdominal pain. Low back pain. Testicular pain. Dysuria.  EXAM: CT ABDOMEN AND PELVIS WITH CONTRAST  TECHNIQUE: Multidetector CT imaging of the abdomen and pelvis was performed using the standard protocol following bolus administration of intravenous contrast.  CONTRAST:  OMNIPAQUE IOHEXOL 300 MG/ML  SOLN  COMPARISON:  08/29/2011.  FINDINGS: No focal abnormality is seen in the liver or spleen. There is mild circumferential wall thickening in the distal esophagus. Stomach is unremarkable. Duodenum, pancreas, gallbladder, and adrenal glands have normal imaging features. There is some subtle ill-defined low-attenuation in the uncinate process of the pancreas which appears new in the interval. The pancreatic body appears somewhat ill-defined.  No abdominal aortic aneurysm. There a is no free fluid or lymphadenopathy in the abdomen.  Imaging through the pelvis shows no free intraperitoneal fluid. There is no pelvic sidewall  lymphadenopathy. Bladder is normal in appearance. Prostate gland is unremarkable.  No substantial diverticular disease in the colon. There is no colonic diverticulitis. The terminal ileum wake is normal. The appendix is normal. There is some high density material within a small bowel loop of the central pelvis on image 56, of indeterminate significance. This is probably ingested material.  No small bowel obstruction.  No colonic obstruction.  Bone windows reveal no worrisome lytic or sclerotic osseous lesions.  IMPRESSION: Mild circumferential wall thickening in the distal esophagus. Esophagitis would be a consideration.  Subtle, ill-defined low-attenuation in the uncinate process of the pancreas with a question of some fullness in the body the pancreas. This is best appreciated when comparing back to the earlier exam. A component of underlying pancreatitis cannot be excluded by imaging. Clinical picture is not consistent with pancreatitis, consider follow-up outpatient MRI of the pancreas without and with contrast to further evaluate the uncinate process.   Electronically Signed   By: Kennith Center M.D.   On: 03/01/2013 15:04   [place] Art/ven Flow Abd Pelv Doppler  03/01/2013   CLINICAL DATA:  Scrotal pain.  EXAM: SCROTAL ULTRASOUND  DOPPLER  ULTRASOUND OF THE TESTICLES  TECHNIQUE: Complete ultrasound examination of the testicles, epididymis, and other scrotal structures was performed. Color and spectral Doppler ultrasound were also utilized to evaluate blood flow to the testicles.  COMPARISON:  CT 03/01/2013  FINDINGS: Right testicle  Measurements: 4.7 x 2.5 x 2.9 cm. No mass or microlithiasis visualized.  Left testicle  Measurements: 4.5 x 2.1 x 2.9 cm. No mass or microlithiasis visualized.  Right epididymis:  Normal in size and appearance.  Left epididymis:  Normal in size and appearance.  Hydrocele:  Small bilateral  Varicocele:  None visualized.  Pulsed Doppler interrogation of both testes demonstrates low  resistance arterial and venous waveforms bilaterally.  IMPRESSION: Unremarkable study.   Electronically Signed   By: Charlett Nose M.D.   On: 03/01/2013 16:28    EKG Interpretation    Date/Time:    Ventricular Rate:    PR Interval:    QRS Duration:   QT Interval:    QTC Calculation:   R Axis:     Text Interpretation:            ]                                       MDM   1. Pancreatitis    Presents with abdominal pain and emesis.  Given pain meds and nausea medication.  CT scan with possible evidence of pancreatitis.  Lipase elevated.  Denies alcohol use.  US scrotum neg for acute torsion.  Pain likely secondary to pancreatitis.  Patient admitted for pain control and hydration.    Shon Baton, MD 03/01/13 2000

## 2013-03-01 NOTE — ED Notes (Signed)
Patient transported to CT 

## 2013-03-01 NOTE — Progress Notes (Signed)
UR completed 

## 2013-03-01 NOTE — ED Notes (Signed)
Pt c/o generalized abdominal pain, emesis, lower back pain, testicular pain, and dysuria starting around 0900.  Pain score 10/10.

## 2013-03-01 NOTE — Progress Notes (Signed)
Attempted to call for report x2 from ED RN.

## 2013-03-01 NOTE — H&P (Signed)
[facility] History and Physical  Quince Santana [employer]. AVW:[id number] DOB: 1975-07-02 DOA: 03/01/2013  Referring physician: EDP PCP: none   Chief Complaint: abdominal pain HPI: [name] Nahar Montez Hageman. is a 37 y.o. male with PMH of Chronic neck and back pain on narcotics, h/o bilateral inguinal hernia repair, presents to the ER today with 1 day history of severe peri-umbilical abdominal pain associated with nausea, emesis. Vomitus is non bloody and non bilious. No fevers or chills, no diarrhea, last BM yesterday Initially he had some radiation of the pain to his groin which is now improved In ER, received 3 doses of IV narcotics. Lipase 157, Ct abd pelvis mild circumferential thickening in distal esophagus, some fullness in the body of the pancreas testicular US normal   Review of Systems:  Constitutional:  No weight loss, night sweats, Fevers, chills, fatigue.  HEENT:  No headaches, Difficulty swallowing,Tooth/dental problems,Sore throat,  No sneezing, itching, ear ache, nasal congestion, post nasal drip,  Cardio-vascular:  No chest pain, Orthopnea, PND, swelling in lower extremities, anasarca, dizziness, palpitations  GI:  No heartburn, indigestion, abdominal pain, nausea, vomiting, diarrhea, change in bowel habits, loss of appetite  Resp:  No shortness of breath with exertion or at rest. No excess mucus, no productive cough, No non-productive cough, No coughing up of blood.No change in color of mucus.No wheezing.No chest wall deformity  Skin:  no rash or lesions.  GU:  no dysuria, change in color of urine, no urgency or frequency. No flank pain.  Musculoskeletal:  No joint pain or swelling. No decreased range of motion. No back pain.  Psych:  No change in mood or affect. No depression or anxiety. No memory loss.   Past Medical History  Diagnosis Date  . Hernia     Bilateral Inguinal   Past Surgical History  Procedure Laterality Date  . Inguinal hernia repair    . Thumb  surgery     Social History:  reports that he has been smoking.  He has never used smokeless tobacco. He reports that he does not drink alcohol or use illicit drugs.  Allergies  Allergen Reactions  . Iohexol      Desc: PER MD/PER PATIENT NOT ALLERGIC 06/11/05 RM/PER RADIOLOGIST GIVE PREMEDICATION 06/12/05     Family History  Problem Relation Age of Onset  . Hypertension Mother   . Heart disease Father   . Crohn's disease Maternal Aunt   . Colon cancer Paternal Grandfather      Prior to Admission medications   Medication Sig Start Date End Date Taking? Authorizing Provider  ALPRAZolam Prudy Feeler) 0.5 MG tablet Take 0.5 mg by mouth 3 (three) times daily as needed. For anxiety   Yes Historical Provider, MD  calcium carbonate (TUMS - DOSED IN MG ELEMENTAL CALCIUM) 500 MG chewable tablet Chew 1 tablet by mouth daily.   Yes Historical Provider, MD  oxyCODONE-acetaminophen (PERCOCET) 7.5-325 MG per tablet Take 7.5 mg by mouth Every 8 hours. 10/04/11  Yes Historical Provider, MD   Physical Exam: Filed Vitals:   03/01/13 1201  BP: 153/90  Pulse: 84  Temp: 97.8 F (36.6 C)  Resp: 16    BP 153/90  Pulse 84  Temp(Src) 97.8 F (36.6 C) (Oral)  Resp 16  SpO2 100%  General:  Appears uncomfortable HEENT: PERRLA, EOMI Cardiovascular: RRR, no m/r/g. No LE edema. Telemetry: SR, no arrhythmias  Respiratory: CTA bilaterally, no w/r/r. Normal respiratory effort. Abdomen: soft, mild peri-umbilical tenderness, BS present Skin: no rash or induration  seen on limited exam Musculoskeletal: grossly normal tone BUE/BLE Psychiatric: grossly normal mood and affect, speech fluent and appropriate Neurologic: grossly non-focal.          Labs on Admission:  Basic Metabolic Panel:  Recent Labs Lab 03/01/13 1250  NA 141  K 3.7  CL 104  CO2 26  GLUCOSE 99  BUN 5*  CREATININE 0.82  CALCIUM 9.3   Liver Function Tests:  Recent Labs Lab 03/01/13 1250  AST 16  ALT 11  ALKPHOS 108  BILITOT 0.3   PROT 7.4  ALBUMIN 4.0    Recent Labs Lab 03/01/13 1250  LIPASE 157*   No results found for this basename: AMMONIA,  in the last 168 hours CBC:  Recent Labs Lab 03/01/13 1250  WBC 11.2*  NEUTROABS 8.7*  HGB 15.0  HCT 45.4  MCV 95.6  PLT 238   Cardiac Enzymes: No results found for this basename: CKTOTAL, CKMB, CKMBINDEX, TROPONINI,  in the last 168 hours  BNP (last 3 results) No results found for this basename: PROBNP,  in the last 8760 hours CBG: No results found for this basename: GLUCAP,  in the last 168 hours  Radiological Exams on Admission: US Scrotum  03/01/2013   CLINICAL DATA:  Scrotal pain.  EXAM: SCROTAL ULTRASOUND  DOPPLER ULTRASOUND OF THE TESTICLES  TECHNIQUE: Complete ultrasound examination of the testicles, epididymis, and other scrotal structures was performed. Color and spectral Doppler ultrasound were also utilized to evaluate blood flow to the testicles.  COMPARISON:  CT 03/01/2013  FINDINGS: Right testicle  Measurements: 4.7 x 2.5 x 2.9 cm. No mass or microlithiasis visualized.  Left testicle  Measurements: 4.5 x 2.1 x 2.9 cm. No mass or microlithiasis visualized.  Right epididymis:  Normal in size and appearance.  Left epididymis:  Normal in size and appearance.  Hydrocele:  Small bilateral  Varicocele:  None visualized.  Pulsed Doppler interrogation of both testes demonstrates low resistance arterial and venous waveforms bilaterally.  IMPRESSION: Unremarkable study.   Electronically Signed   By: Charlett Nose M.D.   On: 03/01/2013 16:28   Ct Abdomen Pelvis W Contrast  03/01/2013   CLINICAL DATA:  History of inguinal hernia repair. Generalized abdominal pain. Low back pain. Testicular pain. Dysuria.  EXAM: CT ABDOMEN AND PELVIS WITH CONTRAST  TECHNIQUE: Multidetector CT imaging of the abdomen and pelvis was performed using the standard protocol following bolus administration of intravenous contrast.  CONTRAST:  OMNIPAQUE IOHEXOL 300 MG/ML  SOLN   COMPARISON:  08/29/2011.  FINDINGS: No focal abnormality is seen in the liver or spleen. There is mild circumferential wall thickening in the distal esophagus. Stomach is unremarkable. Duodenum, pancreas, gallbladder, and adrenal glands have normal imaging features. There is some subtle ill-defined low-attenuation in the uncinate process of the pancreas which appears new in the interval. The pancreatic body appears somewhat ill-defined.  No abdominal aortic aneurysm. There a is no free fluid or lymphadenopathy in the abdomen.  Imaging through the pelvis shows no free intraperitoneal fluid. There is no pelvic sidewall lymphadenopathy. Bladder is normal in appearance. Prostate gland is unremarkable.  No substantial diverticular disease in the colon. There is no colonic diverticulitis. The terminal ileum wake is normal. The appendix is normal. There is some high density material within a small bowel loop of the central pelvis on image 56, of indeterminate significance. This is probably ingested material.  No small bowel obstruction.  No colonic obstruction.  Bone windows reveal no worrisome lytic or sclerotic osseous  lesions.  IMPRESSION: Mild circumferential wall thickening in the distal esophagus. Esophagitis would be a consideration.  Subtle, ill-defined low-attenuation in the uncinate process of the pancreas with a question of some fullness in the body the pancreas. This is best appreciated when comparing back to the earlier exam. A component of underlying pancreatitis cannot be excluded by imaging. Clinical picture is not consistent with pancreatitis, consider follow-up outpatient MRI of the pancreas without and with contrast to further evaluate the uncinate process.   Electronically Signed   By: Kennith Center M.D.   On: 03/01/2013 15:04   [place] Art/ven Flow Abd Pelv Doppler  03/01/2013   CLINICAL DATA:  Scrotal pain.  EXAM: SCROTAL ULTRASOUND  DOPPLER ULTRASOUND OF THE TESTICLES  TECHNIQUE: Complete ultrasound  examination of the testicles, epididymis, and other scrotal structures was performed. Color and spectral Doppler ultrasound were also utilized to evaluate blood flow to the testicles.  COMPARISON:  CT 03/01/2013  FINDINGS: Right testicle  Measurements: 4.7 x 2.5 x 2.9 cm. No mass or microlithiasis visualized.  Left testicle  Measurements: 4.5 x 2.1 x 2.9 cm. No mass or microlithiasis visualized.  Right epididymis:  Normal in size and appearance.  Left epididymis:  Normal in size and appearance.  Hydrocele:  Small bilateral  Varicocele:  None visualized.  Pulsed Doppler interrogation of both testes demonstrates low resistance arterial and venous waveforms bilaterally.  IMPRESSION: Unremarkable study.   Electronically Signed   By: Charlett Nose M.D.   On: 03/01/2013 16:28    Assessment/Plan  1. Abdominal pain Likely mild pancreatitis, vs gastritis -supportive care, PPI -IVF, clears, antiemetics -check US-Abdomen, triglycerides, IGG4 -called and d/w Gi Dr.Kaplan who will consult  2. Tobacco sue  -counseled  3. Chronic pain -used to be followed by pain clinic till 1 week ago  DVt proph: lovenox   Code Status: Full Code Family Communication:none at bedside Disposition Plan: observation Time spent:  [facility] Pager [phone]

## 2013-03-02 ENCOUNTER — Inpatient Hospital Stay (HOSPITAL_COMMUNITY): Payer: Medicare Other

## 2013-03-02 DIAGNOSIS — K859 Acute pancreatitis without necrosis or infection, unspecified: Secondary | ICD-10-CM | POA: Diagnosis not present

## 2013-03-02 DIAGNOSIS — R109 Unspecified abdominal pain: Secondary | ICD-10-CM | POA: Diagnosis not present

## 2013-03-02 DIAGNOSIS — R935 Abnormal findings on diagnostic imaging of other abdominal regions, including retroperitoneum: Secondary | ICD-10-CM | POA: Diagnosis not present

## 2013-03-02 DIAGNOSIS — R188 Other ascites: Secondary | ICD-10-CM | POA: Diagnosis not present

## 2013-03-02 LAB — BASIC METABOLIC PANEL
BUN: 5 mg/dL — ABNORMAL LOW (ref 6–23)
Calcium: 8.5 mg/dL (ref 8.4–10.5)
GFR calc Af Amer: >90 mL/min (ref >90)
GFR calc non Af Amer: >90 mL/min (ref >90)
Potassium: 3.7 mEq/L (ref 3.5–5.1)
Sodium: 141 mEq/L (ref 135–145)

## 2013-03-02 LAB — CBC
Hemoglobin: 12.7 g/dL — ABNORMAL LOW (ref 13.0–17.0)
MCH: 31.6 pg (ref 26.0–34.0)
MCHC: 33.5 g/dL (ref 30.0–36.0)
Platelets: 191 10*3/uL (ref 150–400)
RDW: 13.9 % (ref 11.5–15.5)

## 2013-03-02 MED ORDER — KETOROLAC TROMETHAMINE 30 MG/ML IJ SOLN
30.0000 mg | Freq: Four times a day (QID) | INTRAMUSCULAR | Status: DC | PRN
  Administered 2013-03-02 – 2013-03-03 (×3): 30 mg via INTRAVENOUS
  Filled 2013-03-02 (×3): qty 1

## 2013-03-02 MED ORDER — HYDROMORPHONE HCL PF 1 MG/ML IJ SOLN
1.0000 mg | INTRAMUSCULAR | Status: DC | PRN
  Administered 2013-03-02 – 2013-03-04 (×15): 1 mg via INTRAVENOUS
  Filled 2013-03-02 (×15): qty 1

## 2013-03-02 MED ORDER — GADOBENATE DIMEGLUMINE 529 MG/ML IV SOLN
16.0000 mL | Freq: Once | INTRAVENOUS | Status: AC | PRN
  Administered 2013-03-02: 16 mL via INTRAVENOUS

## 2013-03-02 MED ORDER — NICOTINE 21 MG/24HR TD PT24: 21.0000 mg | MEDICATED_PATCH | Freq: Every day | TRANSDERMAL | Status: DC

## 2013-03-02 MED ORDER — NICOTINE 21 MG/24HR TD PT24
21.0000 mg | MEDICATED_PATCH | Freq: Every day | TRANSDERMAL | Status: DC
  Administered 2013-03-02 – 2013-03-04 (×3): 21 mg via TRANSDERMAL
  Filled 2013-03-02 (×4): qty 1

## 2013-03-02 NOTE — Progress Notes (Signed)
PROGRESS NOTE  Brent Morales. AVW:[id number] DOB: 1975/11/11 DOA: 03/01/2013 PCP: Loreen Freud, DO  HPI: Brent Morales. is a 37 y.o. male with PMH of Chronic neck and back pain on narcotics, h/o bilateral inguinal hernia repair, presents to the ER today with 1 day history of severe peri-umbilical abdominal pain associated with nausea, emesis. Vomitus is non bloody and non bilious. No fevers or chills, no diarrhea, last BM yesterday nitially he had some radiation of the pain to his groin which is now improved In ER, received 3 doses of IV narcotics. Lipase 157, Ct abd pelvis mild circumferential thickening in distal esophagus, some fullness in the body of the pancreas testicular US normal  Assessment/Plan: Abdominal pain - likely due to acute pancreatitis. GI has been consulted given CT findings, will pursue MRCP. Appreciate GI input.  Tobacco use Chronic pain  Diet: clear liquid Fluids: NS DVT Prophylaxis: Lovenox  Code Status: Full Family Communication: none  Disposition Plan: inpatient  Consultants:  GI  Procedures:  none   Antibiotics - none  HPI/Subjective: Complains of ongoing abdominal pain.  Objective: Filed Vitals:   03/01/13 1201 03/01/13 1758 03/01/13 2151 03/02/13 0631  BP: 153/90 142/93 129/75 123/64  Pulse: 84 70 76 82  Temp: 97.8 F (36.6 C) 98.1 F (36.7 C) 97.9 F (36.6 C) 98.3 F (36.8 C)  TempSrc: Oral Oral Oral Oral  Resp: 16 16 16 16   Height:  5\' 10"  (1.778 m)    Weight:  78.881 kg (173 lb 14.4 oz)    SpO2: 100% 100% 98% 98%    Intake/Output Summary (Last 24 hours) at 03/02/13 0913 Last data filed at 03/02/13 0300  Gross per 24 hour  Intake    240 ml  Output    800 ml  Net   -560 ml   Filed Weights   03/01/13 1758  Weight: 78.881 kg (173 lb 14.4 oz)   Exam:  General:  NAD  Cardiovascular: regular rate and rhythm, without MRG  Respiratory: good air movement, clear to auscultation throughout, no wheezing, ronchi or  rales  Abdomen: soft, tender to palpation, no guarding, positive bowel sounds  MSK: no peripheral edema  Neuro: non focal  Data Reviewed: Basic Metabolic Panel:  Recent Labs Lab 03/01/13 1250 03/02/13 0429  NA 141 141  K 3.7 3.7  CL 104 107  CO2 26 25  GLUCOSE 99 92  BUN 5* 5*  CREATININE 0.82 0.78  CALCIUM 9.3 8.5   Liver Function Tests:  Recent Labs Lab 03/01/13 1250  AST 16  ALT 11  ALKPHOS 108  BILITOT 0.3  PROT 7.4  ALBUMIN 4.0    Recent Labs Lab 03/01/13 1250  LIPASE 157*   CBC:  Recent Labs Lab 03/01/13 1250 03/02/13 0429  WBC 11.2* 13.4*  NEUTROABS 8.7*  --   HGB 15.0 12.7*  HCT 45.4 37.9*  MCV 95.6 94.3  PLT 238 191   Studies: US Scrotum  03/01/2013   CLINICAL DATA:  Scrotal pain.  EXAM: SCROTAL ULTRASOUND  DOPPLER ULTRASOUND OF THE TESTICLES  TECHNIQUE: Complete ultrasound examination of the testicles, epididymis, and other scrotal structures was performed. Color and spectral Doppler ultrasound were also utilized to evaluate blood flow to the testicles.  COMPARISON:  CT 03/01/2013  FINDINGS: Right testicle  Measurements: 4.7 x 2.5 x 2.9 cm. No mass or microlithiasis visualized.  Left testicle  Measurements: 4.5 x 2.1 x 2.9 cm. No mass or microlithiasis visualized.  Right epididymis:  Normal  in size and appearance.  Left epididymis:  Normal in size and appearance.  Hydrocele:  Small bilateral  Varicocele:  None visualized.  Pulsed Doppler interrogation of both testes demonstrates low resistance arterial and venous waveforms bilaterally.  IMPRESSION: Unremarkable study.   Electronically Signed   By: Charlett Nose M.D.   On: 03/01/2013 16:28   Ct Abdomen Pelvis W Contrast  03/01/2013   CLINICAL DATA:  History of inguinal hernia repair. Generalized abdominal pain. Low back pain. Testicular pain. Dysuria.  EXAM: CT ABDOMEN AND PELVIS WITH CONTRAST  TECHNIQUE: Multidetector CT imaging of the abdomen and pelvis was performed using the standard protocol  following bolus administration of intravenous contrast.  CONTRAST:  OMNIPAQUE IOHEXOL 300 MG/ML  SOLN  COMPARISON:  08/29/2011.  FINDINGS: No focal abnormality is seen in the liver or spleen. There is mild circumferential wall thickening in the distal esophagus. Stomach is unremarkable. Duodenum, pancreas, gallbladder, and adrenal glands have normal imaging features. There is some subtle ill-defined low-attenuation in the uncinate process of the pancreas which appears new in the interval. The pancreatic body appears somewhat ill-defined.  No abdominal aortic aneurysm. There a is no free fluid or lymphadenopathy in the abdomen.  Imaging through the pelvis shows no free intraperitoneal fluid. There is no pelvic sidewall lymphadenopathy. Bladder is normal in appearance. Prostate gland is unremarkable.  No substantial diverticular disease in the colon. There is no colonic diverticulitis. The terminal ileum wake is normal. The appendix is normal. There is some high density material within a small bowel loop of the central pelvis on image 56, of indeterminate significance. This is probably ingested material.  No small bowel obstruction.  No colonic obstruction.  Bone windows reveal no worrisome lytic or sclerotic osseous lesions.  IMPRESSION: Mild circumferential wall thickening in the distal esophagus. Esophagitis would be a consideration.  Subtle, ill-defined low-attenuation in the uncinate process of the pancreas with a question of some fullness in the body the pancreas. This is best appreciated when comparing back to the earlier exam. A component of underlying pancreatitis cannot be excluded by imaging. Clinical picture is not consistent with pancreatitis, consider follow-up outpatient MRI of the pancreas without and with contrast to further evaluate the uncinate process.   Electronically Signed   By: Kennith Center M.D.   On: 03/01/2013 15:04   US Abdomen Limited  03/02/2013   CLINICAL DATA:  Abdominal pain   EXAM: US ABDOMEN LIMITED - RIGHT UPPER QUADRANT  COMPARISON:  CT abdomen and pelvis March 01, 2013  FINDINGS: Gallbladder  No gallstones or wall thickening visualized. There is no pericholecystic fluid. No sonographic Murphy sign noted.  Common bile duct  Diameter: 4 mm. There is no intrahepatic or extrahepatic biliary duct dilatation.  Liver:  No focal lesion identified. Within normal limits in parenchymal echogenicity.  IMPRESSION: Study within normal limits.   Electronically Signed   By: Bretta Bang M.D.   On: 03/02/2013 08:50   [place] Art/ven Flow Abd Pelv Doppler  03/01/2013   CLINICAL DATA:  Scrotal pain.  EXAM: SCROTAL ULTRASOUND  DOPPLER ULTRASOUND OF THE TESTICLES  TECHNIQUE: Complete ultrasound examination of the testicles, epididymis, and other scrotal structures was performed. Color and spectral Doppler ultrasound were also utilized to evaluate blood flow to the testicles.  COMPARISON:  CT 03/01/2013  FINDINGS: Right testicle  Measurements: 4.7 x 2.5 x 2.9 cm. No mass or microlithiasis visualized.  Left testicle  Measurements: 4.5 x 2.1 x 2.9 cm. No mass or microlithiasis visualized.  Right epididymis:  Normal in size and appearance.  Left epididymis:  Normal in size and appearance.  Hydrocele:  Small bilateral  Varicocele:  None visualized.  Pulsed Doppler interrogation of both testes demonstrates low resistance arterial and venous waveforms bilaterally.  IMPRESSION: Unremarkable study.   Electronically Signed   By: Charlett Nose M.D.   On: 03/01/2013 16:28    Scheduled Meds: . enoxaparin (LOVENOX) injection  40 mg Subcutaneous Q24H  . nicotine  21 mg Transdermal Daily  . pantoprazole (PROTONIX) IV  40 mg Intravenous Q12H   Continuous Infusions: . sodium chloride 150 mL/hr at 03/02/13 0756    Active Problems:   ANXIETY   DRUG DEPENDENCE   SPINAL STENOSIS, CERVICAL   ABDOMINAL PAIN OTHER SPECIFIED SITE   Tobacco abuse disorder   Pancreatitis   Abdominal pain  Time spent:  25  Pamella Pert, MD [facility] Pager [phone]. If 7 PM - 7 AM, please contact night-coverage at [email or web address], password [facility] 03/02/2013, 9:13 AM  LOS: 1 day

## 2013-03-02 NOTE — Consult Note (Signed)
Patient was seen and examined.  X-rays were reviewed.  Clinical presentation is compatible with acute pancreatitis.  Findings on CT are subtle and not diagnostic, however, in this clinical setting I suspect changes are 2 to inflammation.  Etiology for pancreatitis is uncertain.  There are no obvious gallbladder stones.  Hypertriglyceridemia should be ruled out as well as autoimmune pancreatitis.  He is on no meds associated with pancreatitis.  There is no history of alcohol abuse.  Pancreatic divisum is also associated with a higher incidence of recurrent pancreatitis.  Recommend obtaining a lipid panel and serum IgG 4 level.  If these are negative I would eventually due an MRCP to rule out anatomic abnormalities of the pancreatic duct, and choledocholithiasis.  I would also check a followup lipase and amylase.

## 2013-03-02 NOTE — Consult Note (Signed)
Referring Provider: No ref. provider found Primary Care Physician:  Loreen Freud, DO Primary Gastroenterologist:  Dr. Marina Goodell  Reason for Consultation:  Pancreatitis  HPI: Brent Morales. is a 37 y.o. male with PMH of Chronic neck and back pain on narcotics, h/o bilateral inguinal hernia repair.  He presented to the ER on 12/22 with complaints of abdominal pain, nausea, and vomiting that started the morning of admission.  Was feeling normal on Sunday but woke up with pain Monday morning.  Never had similar pain in the past.  Had nause and vomiting from the pain.  Vomitus was non-bloody.  No fevers or chills.  No diarrhea, last BM was Sunday.  On evaluation he was found to have lipase 157, normal CMP, slightly elevated WBC count at 11.2.  CT scan of the abdomen and pelvis was performed and showed the following:  IMPRESSION:  Mild circumferential wall thickening in the distal esophagus.  Esophagitis would be a consideration.   Subtle, ill-defined low-attenuation in the uncinate process of the  pancreas with a question of some fullness in the body the pancreas.  This is best appreciated when comparing back to the earlier exam. A component of underlying pancreatitis cannot be excluded by imaging.  Clinical picture is not consistent with pancreatitis, consider follow-up outpatient MRI of the pancreas without and with contrast to further evaluate the uncinate process.  He was admitted, placed NPO with IVF's at 150 cc/hour, pain meds and anti-emetics prn.    Lipase today is 830 and amylase is 462.  Triglycerides are normal at 51 and IgG 4 levels pending.  Ultrasound of the abdomen was normal.  Says that pain was 10/10 at its worst point and is currently 8.5/10.  Says that the pain meds help, but do not last 4 hours.  Is asking for decrease in frequency to every 3 hours.  Never had pancreatitis in the past.  Denies ETOH use.   Past Medical History  Diagnosis Date  . Hernia     Bilateral Inguinal     Past Surgical History  Procedure Laterality Date  . Inguinal hernia repair    . Thumb surgery      Prior to Admission medications   Medication Sig Start Date End Date Taking? Authorizing Provider  ALPRAZolam Prudy Feeler) 0.5 MG tablet Take 0.5 mg by mouth 3 (three) times daily as needed. For anxiety   Yes Historical Provider, MD  calcium carbonate (TUMS - DOSED IN MG ELEMENTAL CALCIUM) 500 MG chewable tablet Chew 1 tablet by mouth daily.   Yes Historical Provider, MD  oxyCODONE-acetaminophen (PERCOCET) 7.5-325 MG per tablet Take 7.5 mg by mouth Every 8 hours. 10/04/11  Yes Historical Provider, MD    Current Facility-Administered Medications  Medication Dose Route Frequency Provider Last Rate Last Dose  . 0.9 %  sodium chloride infusion   Intravenous Continuous Zannie Cove, MD 150 mL/hr at 03/02/13 0756    . acetaminophen (TYLENOL) tablet 650 mg  650 mg Oral Q6H PRN Zannie Cove, MD      . ALPRAZolam Prudy Feeler) tablet 0.5 mg  0.5 mg Oral TID PRN Zannie Cove, MD   0.5 mg at 03/02/13 1136  . enoxaparin (LOVENOX) injection 40 mg  40 mg Subcutaneous Q24H Zannie Cove, MD   40 mg at 03/01/13 2120  . HYDROmorphone (DILAUDID) injection 1 mg  1 mg Intravenous Q4H PRN Zannie Cove, MD   1 mg at 03/02/13 1059  . ketorolac (TORADOL) 30 MG/ML injection 30 mg  30 mg  Intravenous Q6H PRN Roma Kayser Schorr, NP   30 mg at 03/02/13 0420  . nicotine (NICODERM CQ - dosed in mg/24 hours) patch 21 mg  21 mg Transdermal Daily Roma Kayser Schorr, NP   21 mg at 03/02/13 0050  . ondansetron (ZOFRAN) injection 4 mg  4 mg Intravenous Q6H PRN Zannie Cove, MD   4 mg at 03/01/13 1846  . oxyCODONE-acetaminophen (PERCOCET/ROXICET) 5-325 MG per tablet 1 tablet  1 tablet Oral Q6H PRN Zannie Cove, MD   1 tablet at 03/02/13 6962   And  . oxyCODONE (Oxy IR/ROXICODONE) immediate release tablet 2.5 mg  2.5 mg Oral Q6H PRN Zannie Cove, MD   2.5 mg at 03/02/13 9528  . pantoprazole (PROTONIX) injection 40 mg  40 mg  Intravenous Q12H Zannie Cove, MD   40 mg at 03/02/13 4132    Allergies as of 03/01/2013 - Review Complete 03/01/2013  Allergen Reaction Noted  . Iohexol  04/10/2004    Family History  Problem Relation Age of Onset  . Hypertension Mother   . Heart disease Father   . Crohn's disease Maternal Aunt   . Colon cancer Paternal Grandfather     History   Social History  . Marital Status: Single    Spouse Name: N/A    Number of Children: N/A  . Years of Education: N/A   Occupational History  . Not on file.   Social History Main Topics  . Smoking status: Current Every Day Smoker -- 1.00 packs/day for 25 years  . Smokeless tobacco: Never Used  . Alcohol Use: No  . Drug Use: No  . Sexual Activity: Not on file   Other Topics Concern  . Not on file   Social History Narrative  . No narrative on file    Review of Systems: Ten point ROS is O/W negative except as mentioned in HPI.  Physical Exam: Vital signs in last 24 hours: Temp:  [97.8 F (36.6 C)-98.3 F (36.8 C)] 98.3 F (36.8 C) (12/23 0631) Pulse Rate:  [70-84] 82 (12/23 0631) Resp:  [16] 16 (12/23 0631) BP: (123-153)/(64-93) 123/64 mmHg (12/23 0631) SpO2:  [98 %-100 %] 98 % (12/23 0631) Weight:  [173 lb 14.4 oz (78.881 kg)] 173 lb 14.4 oz (78.881 kg) (12/22 1758) Last BM Date: 02/28/13 General:  Alert, Well-developed, well-nourished, pleasant and cooperative in NAD Head:  Normocephalic and atraumatic. Eyes:  Sclera clear, no icterus.  Conjunctiva pink. Ears:  Normal auditory acuity. Mouth:  No deformity or lesions.   Lungs:  Clear throughout to auscultation.  No wheezes, crackles, or rhonchi.  Heart:  Regular rate and rhythm; no murmurs, clicks, rubs,  or gallops. Abdomen:  Soft, non-distended.  BS present.  Diffuse TTP > in the epigastrium and LUQ without R/R/G.   Rectal:  Deferred  Msk:  Symmetrical without gross deformities. Pulses:  Normal pulses noted. Extremities:  Without clubbing or  edema. Neurologic:  Alert and  oriented x4;  grossly normal neurologically. Skin:  Intact without significant lesions or rashes. Psych:  Alert and cooperative. Normal mood and affect.  Intake/Output from previous day: 12/22 0701 - 12/23 0700 In: 240 [P.O.:240] Out: 800 [Urine:800] Intake/Output this shift: Total I/O In: -  Out: 400 [Urine:400]  Lab Results:  Recent Labs  03/01/13 1250 03/02/13 0429  WBC 11.2* 13.4*  HGB 15.0 12.7*  HCT 45.4 37.9*  PLT 238 191   BMET  Recent Labs  03/01/13 1250 03/02/13 0429  NA 141 141  K 3.7 3.7  CL 104 107  CO2 26 25  GLUCOSE 99 92  BUN 5* 5*  CREATININE 0.82 0.78  CALCIUM 9.3 8.5   LFT  Recent Labs  03/01/13 1250  PROT 7.4  ALBUMIN 4.0  AST 16  ALT 11  ALKPHOS 108  BILITOT 0.3   Studies/Results: US Scrotum  03/01/2013   CLINICAL DATA:  Scrotal pain.  EXAM: SCROTAL ULTRASOUND  DOPPLER ULTRASOUND OF THE TESTICLES  TECHNIQUE: Complete ultrasound examination of the testicles, epididymis, and other scrotal structures was performed. Color and spectral Doppler ultrasound were also utilized to evaluate blood flow to the testicles.  COMPARISON:  CT 03/01/2013  FINDINGS: Right testicle  Measurements: 4.7 x 2.5 x 2.9 cm. No mass or microlithiasis visualized.  Left testicle  Measurements: 4.5 x 2.1 x 2.9 cm. No mass or microlithiasis visualized.  Right epididymis:  Normal in size and appearance.  Left epididymis:  Normal in size and appearance.  Hydrocele:  Small bilateral  Varicocele:  None visualized.  Pulsed Doppler interrogation of both testes demonstrates low resistance arterial and venous waveforms bilaterally.  IMPRESSION: Unremarkable study.   Electronically Signed   By: Charlett Nose M.D.   On: 03/01/2013 16:28   Ct Abdomen Pelvis W Contrast  03/01/2013   CLINICAL DATA:  History of inguinal hernia repair. Generalized abdominal pain. Low back pain. Testicular pain. Dysuria.  EXAM: CT ABDOMEN AND PELVIS WITH CONTRAST   TECHNIQUE: Multidetector CT imaging of the abdomen and pelvis was performed using the standard protocol following bolus administration of intravenous contrast.  CONTRAST:  OMNIPAQUE IOHEXOL 300 MG/ML  SOLN  COMPARISON:  08/29/2011.  FINDINGS: No focal abnormality is seen in the liver or spleen. There is mild circumferential wall thickening in the distal esophagus. Stomach is unremarkable. Duodenum, pancreas, gallbladder, and adrenal glands have normal imaging features. There is some subtle ill-defined low-attenuation in the uncinate process of the pancreas which appears new in the interval. The pancreatic body appears somewhat ill-defined.  No abdominal aortic aneurysm. There a is no free fluid or lymphadenopathy in the abdomen.  Imaging through the pelvis shows no free intraperitoneal fluid. There is no pelvic sidewall lymphadenopathy. Bladder is normal in appearance. Prostate gland is unremarkable.  No substantial diverticular disease in the colon. There is no colonic diverticulitis. The terminal ileum wake is normal. The appendix is normal. There is some high density material within a small bowel loop of the central pelvis on image 56, of indeterminate significance. This is probably ingested material.  No small bowel obstruction.  No colonic obstruction.  Bone windows reveal no worrisome lytic or sclerotic osseous lesions.  IMPRESSION: Mild circumferential wall thickening in the distal esophagus. Esophagitis would be a consideration.  Subtle, ill-defined low-attenuation in the uncinate process of the pancreas with a question of some fullness in the body the pancreas. This is best appreciated when comparing back to the earlier exam. A component of underlying pancreatitis cannot be excluded by imaging. Clinical picture is not consistent with pancreatitis, consider follow-up outpatient MRI of the pancreas without and with contrast to further evaluate the uncinate process.   Electronically Signed   By: Kennith Center M.D.   On: 03/01/2013 15:04   US Abdomen Limited  03/02/2013   CLINICAL DATA:  Abdominal pain  EXAM: US ABDOMEN LIMITED - RIGHT UPPER QUADRANT  COMPARISON:  CT abdomen and pelvis March 01, 2013  FINDINGS: Gallbladder  No gallstones or wall thickening visualized. There is no pericholecystic fluid. No sonographic Murphy sign noted.  Common bile duct  Diameter: 4 mm. There is no intrahepatic or extrahepatic biliary duct dilatation.  Liver:  No focal lesion identified. Within normal limits in parenchymal echogenicity.  IMPRESSION: Study within normal limits.   Electronically Signed   By: Bretta Bang M.D.   On: 03/02/2013 08:50   [place] Art/ven Flow Abd Pelv Doppler  03/01/2013   CLINICAL DATA:  Scrotal pain.  EXAM: SCROTAL ULTRASOUND  DOPPLER ULTRASOUND OF THE TESTICLES  TECHNIQUE: Complete ultrasound examination of the testicles, epididymis, and other scrotal structures was performed. Color and spectral Doppler ultrasound were also utilized to evaluate blood flow to the testicles.  COMPARISON:  CT 03/01/2013  FINDINGS: Right testicle  Measurements: 4.7 x 2.5 x 2.9 cm. No mass or microlithiasis visualized.  Left testicle  Measurements: 4.5 x 2.1 x 2.9 cm. No mass or microlithiasis visualized.  Right epididymis:  Normal in size and appearance.  Left epididymis:  Normal in size and appearance.  Hydrocele:  Small bilateral  Varicocele:  None visualized.  Pulsed Doppler interrogation of both testes demonstrates low resistance arterial and venous waveforms bilaterally.  IMPRESSION: Unremarkable study.   Electronically Signed   By: Charlett Nose M.D.   On: 03/01/2013 16:28    IMPRESSION:  -Likely acute pancreatitis of unknown etiology.  Ultrasound negative for gallstones, triglycerides are normal, no ETOH abuse, not on any meds associated with pancreatitis.  Rule out autoimmune pancreatitis and pancreatic divisum. -? Esophagitis on CT scan  PLAN: -Await IgG 4 levels. -Will order MRCP as well to  evaluate anatomy and rule out microlithiasis. -Will decrease dilaudid to every 3 hours. -Agree with BID PPI for now.   Brent Miranda D.  03/02/2013, 12:00 PM  Pager number [phone]

## 2013-03-03 ENCOUNTER — Encounter: Payer: Self-pay | Admitting: Physician Assistant

## 2013-03-03 DIAGNOSIS — R109 Unspecified abdominal pain: Secondary | ICD-10-CM | POA: Diagnosis not present

## 2013-03-03 DIAGNOSIS — F411 Generalized anxiety disorder: Secondary | ICD-10-CM | POA: Diagnosis not present

## 2013-03-03 DIAGNOSIS — D72829 Elevated white blood cell count, unspecified: Secondary | ICD-10-CM

## 2013-03-03 DIAGNOSIS — K859 Acute pancreatitis without necrosis or infection, unspecified: Secondary | ICD-10-CM | POA: Diagnosis not present

## 2013-03-03 LAB — CBC
HCT: 37.3 % — ABNORMAL LOW (ref 39.0–52.0)
MCH: 32.5 pg (ref 26.0–34.0)
MCHC: 34.6 g/dL (ref 30.0–36.0)
Platelets: 192 10*3/uL (ref 150–400)
RBC: 3.97 MIL/uL — ABNORMAL LOW (ref 4.22–5.81)
RDW: 13.6 % (ref 11.5–15.5)

## 2013-03-03 LAB — RAPID URINE DRUG SCREEN, HOSP PERFORMED
Benzodiazepines: POSITIVE — AB
Opiates: POSITIVE — AB

## 2013-03-03 LAB — COMPREHENSIVE METABOLIC PANEL
AST: 13 U/L (ref 0–37)
Alkaline Phosphatase: 82 U/L (ref 39–117)
BUN: 5 mg/dL — ABNORMAL LOW (ref 6–23)
CO2: 23 mEq/L (ref 19–32)
Chloride: 104 mEq/L (ref 96–112)
Creatinine, Ser: 0.67 mg/dL (ref 0.50–1.35)
GFR calc non Af Amer: >90 mL/min (ref >90)
Potassium: 3.4 mEq/L — ABNORMAL LOW (ref 3.5–5.1)
Sodium: 137 mEq/L (ref 135–145)
Total Bilirubin: 0.4 mg/dL (ref 0.3–1.2)
Total Protein: 5.9 g/dL — ABNORMAL LOW (ref 6.0–8.3)

## 2013-03-03 LAB — ETHANOL: Alcohol, Ethyl (B): <11 mg/dL (ref 0–11)

## 2013-03-03 MED ORDER — SIMETHICONE 80 MG PO CHEW
80.0000 mg | CHEWABLE_TABLET | Freq: Four times a day (QID) | ORAL | Status: DC | PRN
  Administered 2013-03-04: 80 mg via ORAL
  Filled 2013-03-03 (×2): qty 1

## 2013-03-03 MED ORDER — POTASSIUM CHLORIDE 10 MEQ/100ML IV SOLN
10.0000 meq | INTRAVENOUS | Status: AC
  Administered 2013-03-03 (×3): 10 meq via INTRAVENOUS
  Filled 2013-03-03 (×3): qty 100

## 2013-03-03 NOTE — Progress Notes (Signed)
[facility] PROGRESS NOTE  Brent Morales DOB: [birth year] DOA: 03/01/2013 PCP: Loreen Freud, DO  Brief narrative: 37 y.o. male with PMH of Chronic neck and back pain on narcotics, h/o bilateral inguinal hernia repair, presented to [facility] ED 03/01/2013 with 1 day history of severe peri-umbilical abdominal pain associated with nausea, emesis. Vomitus is non bloody and non bilious.  In ER, pt received 3 doses of IV narcotics. Lipase 157, Ct abd pelvis showed mild circumferential thickening in distal esophagus, some fullness in the body of the pancreas and testicular US was normal.  Assessment/Plan:   Principal Problem: Acute pancreatitis - on admission lipase was 157 but then acutely elevated at about 800; normal LFT's and triglycerides; will obtain ethanol and UDS since it has not been collected on the admission. - appreciate GI following - had MRCP but no clear cause of pancreatitis established - continue supportive care with IV fluids, liquid diet, analgesia - repeat lipase level in am  Code Status: full code Family Communication: no family at the bedside  Disposition Plan: home when stable  Consultants:  GI Procedures:  None  Antibiotics  None    Manson Passey, MD  [facility] Pager [phone]  If 7PM-7AM, please contact night-coverage [email or web address] Password [facility] 03/03/2013, 9:19 AM   LOS: 2 days    HPI/Subjective: Still with abdominal pain, rate pain 9/10 this am.   Objective: Filed Vitals:   03/02/13 0631 03/02/13 1400 03/02/13 2307 03/03/13 0525  BP: 123/64 116/70 115/61 120/64  Pulse: 82 81 78 82  Temp: 98.3 F (36.8 C) 99.5 F (37.5 C) 99.6 F (37.6 C) 98.4 F (36.9 C)  TempSrc: Oral Oral Oral Oral  Resp: 16 18 16 16   Height:      Weight:      SpO2: 98% 98% 98% 98%    Intake/Output Summary (Last 24 hours) at 03/03/13 0919 Last data filed at 03/03/13 0724  Gross per 24 hour  Intake    840 ml  Output   3350 ml  Net  -2510  ml    Exam:   General:  Pt is alert, follows commands appropriately, not in acute distress  Cardiovascular: Regular rate and rhythm, S1/S2, no murmurs, no rubs, no gallops  Respiratory: Clear to auscultation bilaterally, no wheezing, no crackles, no rhonchi  Abdomen: Soft, tender in mid abdomen,, non distended, bowel sounds present, no guarding  Extremities: No edema, pulses DP and PT palpable bilaterally  Neuro: Grossly nonfocal  Data Reviewed: Basic Metabolic Panel:   Recent Labs Lab 03/01/13 1250 03/02/13 0429 03/03/13 0455  NA 141 141 137  K 3.7 3.7 3.4*  CL 104 107 104  CO2 26 25 23   GLUCOSE 99 92 95  BUN 5* 5* 5*  CREATININE 0.82 0.78 0.67  CALCIUM 9.3 8.5 8.2*   Liver Function Tests:  Recent Labs Lab 03/01/13 1250 03/03/13 0455  AST 16 13  ALT 11 7  ALKPHOS 108 82  BILITOT 0.3 0.4  PROT 7.4 5.9*  ALBUMIN 4.0 3.0*    Recent Labs Lab 03/01/13 1250 03/02/13 0430  LIPASE 157* 830*  AMYLASE  --  462*   No results found for this basename: AMMONIA,  in the last 168 hours CBC:  Recent Labs Lab 03/01/13 1250 03/02/13 0429 03/03/13 0455  WBC 11.2* 13.4* 12.1*  NEUTROABS 8.7*  --   --   HGB 15.0 12.7* 12.9*  HCT 45.4 37.9* 37.3*  MCV 95.6 94.3 94.0  PLT 238 191 192  Cardiac Enzymes: No results found for this basename: CKTOTAL, CKMB, CKMBINDEX, TROPONINI,  in the last 168 hours BNP: No components found with this basename: POCBNP,  CBG: No results found for this basename: GLUCAP,  in the last 168 hours  No results found for this or any previous visit (from the past 240 hour(s)).   Studies: US Scrotum 03/01/2013     IMPRESSION: Unremarkable study.   Electronically Signed   By: Charlett Nose M.D.   On: 03/01/2013 16:28   Ct Abdomen Pelvis W Contrast 03/01/2013     IMPRESSION: Mild circumferential wall thickening in the distal esophagus. Esophagitis would be a consideration.  Subtle, ill-defined low-attenuation in the uncinate process of the  pancreas with a question of some fullness in the body the pancreas. This is best appreciated when comparing back to the earlier exam. A component of underlying pancreatitis cannot be excluded by imaging. Clinical picture is not consistent with pancreatitis, consider follow-up outpatient MRI of the pancreas without and with contrast to further evaluate the uncinate process.   Electronically Signed   By: Kennith Center M.D.   On: 03/01/2013 15:04   Mr 3d Recon At Scanner 03/02/2013    IMPRESSION: Small volume intraperitoneal free fluid with edema in the retroperitoneal tissues. There is apparent edema within the pancreatic head and uncinate process. Postcontrast imaging shows homogeneous enhancement throughout the pancreatic parenchyma with no specific features to suggest pancreatic necrosis. No evidence for vascular complication.  No evidence for cholelithiasis or choledocholithiasis. No specific imaging features to suggest pancreas divisum. There is some edema in the head of the pancreas and pancreatic ductal anatomy in the head of pancreas is not well demonstrated. Consider repeat MRCP after resolution of symptoms to more definitively evaluate.   Electronically Signed   By: Kennith Center M.D.   On: 03/02/2013 15:18   US Abdomen Limited 03/02/2013  IMPRESSION: Study within normal limits.   Electronically Signed   By: Bretta Bang M.D.   On: 03/02/2013 08:50   [place] Art/ven Flow Abd Pelv Doppler 03/01/2013    IMPRESSION: Unremarkable study.   Electronically Signed   By: Charlett Nose M.D.   On: 03/01/2013 16:28   Mr Jorja Loa Cm/mrcp 03/02/2013    IMPRESSION: Small volume intraperitoneal free fluid with edema in the retroperitoneal tissues. There is apparent edema within the pancreatic head and uncinate process. Postcontrast imaging shows homogeneous enhancement throughout the pancreatic parenchyma with no specific features to suggest pancreatic necrosis. No evidence for vascular complication.  No evidence  for cholelithiasis or choledocholithiasis. No specific imaging features to suggest pancreas divisum. There is some edema in the head of the pancreas and pancreatic ductal anatomy in the head of pancreas is not well demonstrated. Consider repeat MRCP after resolution of symptoms to more definitively evaluate.   Electronically Signed   By: Kennith Center M.D.   On: 03/02/2013 15:18    Scheduled Meds: . enoxaparin (LOVENOX) injection  40 mg Subcutaneous Q24H  . nicotine  21 mg Transdermal Daily  . pantoprazole (PROTONIX) IV  40 mg Intravenous Q12H  . potassium chloride  10 mEq Intravenous Q1 Hr x 3   Continuous Infusions: . sodium chloride 150 mL/hr at 03/02/13 1617

## 2013-03-03 NOTE — Progress Notes (Signed)
Progress Note   Subjective  had significant LUQ pain radiating around side this am. Tolerating clears   Objective   Vital signs in last 24 hours: Temp:  [98.4 F (36.9 C)-99.6 F (37.6 C)] 98.4 F (36.9 C) (12/24 0525) Pulse Rate:  [78-82] 82 (12/24 0525) Resp:  [16-18] 16 (12/24 0525) BP: (115-120)/(61-70) 120/64 mmHg (12/24 0525) SpO2:  [98 %] 98 % (12/24 0525) Last BM Date: 02/28/13 General:    Pleasant white male in NAD. Father at bedside Heart:  Regular rate and rhythm; no murmurs Lungs: Respirations even and unlabored, diminished breath sounds at bilateral bases. Abdomen:  Soft, mildly distended, mild diffuse upper tenderness. Normal bowel sounds. Extremities:  1+ BLE edema.  Neurologic:  Alert and oriented,  grossly normal neurologically. Psych:  Cooperative. Normal mood and affect.  Intake/Output from previous day: 12/23 0701 - 12/24 0700 In: 840 [P.O.:840] Out: 2850 [Urine:2850] Intake/Output this shift: Total I/O In: -  Out: 800 [Urine:800]  Lab Results:  Recent Labs  03/01/13 1250 03/02/13 0429 03/03/13 0455  WBC 11.2* 13.4* 12.1*  HGB 15.0 12.7* 12.9*  HCT 45.4 37.9* 37.3*  PLT 238 191 192   BMET  Recent Labs  03/01/13 1250 03/02/13 0429 03/03/13 0455  NA 141 141 137  K 3.7 3.7 3.4*  CL 104 107 104  CO2 26 25 23   GLUCOSE 99 92 95  BUN 5* 5* 5*  CREATININE 0.82 0.78 0.67  CALCIUM 9.3 8.5 8.2*   LFT  Recent Labs  03/03/13 0455  PROT 5.9*  ALBUMIN 3.0*  AST 13  ALT 7  ALKPHOS 82  BILITOT 0.4    Studies/Results:  Ct Abdomen Pelvis W Contrast  03/01/2013   CLINICAL DATA:  History of inguinal hernia repair. Generalized abdominal pain. Low back pain. Testicular pain. Dysuria.  EXAM: CT ABDOMEN AND PELVIS WITH CONTRAST  TECHNIQUE: Multidetector CT imaging of the abdomen and pelvis was performed using the standard protocol following bolus administration of intravenous contrast.  CONTRAST:  OMNIPAQUE IOHEXOL 300 MG/ML  SOLN   COMPARISON:  08/29/2011.  FINDINGS: No focal abnormality is seen in the liver or spleen. There is mild circumferential wall thickening in the distal esophagus. Stomach is unremarkable. Duodenum, pancreas, gallbladder, and adrenal glands have normal imaging features. There is some subtle ill-defined low-attenuation in the uncinate process of the pancreas which appears new in the interval. The pancreatic body appears somewhat ill-defined.  No abdominal aortic aneurysm. There a is no free fluid or lymphadenopathy in the abdomen.  Imaging through the pelvis shows no free intraperitoneal fluid. There is no pelvic sidewall lymphadenopathy. Bladder is normal in appearance. Prostate gland is unremarkable.  No substantial diverticular disease in the colon. There is no colonic diverticulitis. The terminal ileum wake is normal. The appendix is normal. There is some high density material within a small bowel loop of the central pelvis on image 56, of indeterminate significance. This is probably ingested material.  No small bowel obstruction.  No colonic obstruction.  Bone windows reveal no worrisome lytic or sclerotic osseous lesions.  IMPRESSION: Mild circumferential wall thickening in the distal esophagus. Esophagitis would be a consideration.  Subtle, ill-defined low-attenuation in the uncinate process of the pancreas with a question of some fullness in the body the pancreas. This is best appreciated when comparing back to the earlier exam. A component of underlying pancreatitis cannot be excluded by imaging. Clinical picture is not consistent with pancreatitis, consider follow-up outpatient MRI of the pancreas without  and with contrast to further evaluate the uncinate process.   Electronically Signed   By: Kennith Center M.D.   On: 03/01/2013 15:04   Mr 3d Recon At Scanner  03/02/2013   CLINICAL DATA:  Clinical picture of acute pancreatitis. No risk factors identified to account for the pancreatic inflammation. MRCP  ordered to assess for choledocholithiasis or pancreas divisum.  EXAM: MRI ABDOMEN WITHOUT AND WITH CONTRAST (INCLUDING MRCP)  TECHNIQUE: Multiplanar multisequence MR imaging of the abdomen was performed both before and after the administration of intravenous contrast. Heavily T2-weighted images of the biliary and pancreatic ducts were obtained, and three-dimensional MRCP images were rendered by post processing.  CONTRAST:  16mL MULTIHANCE GADOBENATE DIMEGLUMINE 529 MG/ML IV SOLN  COMPARISON:  CT scan from 03/01/2013.  FINDINGS: There is no intra or extrahepatic biliary duct dilatation. No evidence for gallstones. No filling defects within the extrahepatic biliary tree to suggest choledocholithiasis.  No pancreatic ductal dilatation. The main pancreatic duct is not well demonstrated in the head of the pancreas. Towards the ampulla, there is a small segment of the pancreatic duct visualized which would suggest normal ductal anatomy. No evidence for an accessory duct or enlargement of the pancreatic head as can be seen with pancreas divisum.  The patient has perihepatic and perisplenic ascites. There is edema within the retroperitoneal soft tissues, consistent with the reported history of pancreatitis. No focal or discrete retroperitoneal fluid collection at this time. Postcontrast imaging confirms patency of the portal vein, superior mesenteric vein, and splenic vein. The celiac axis and superior mesenteric artery are patent.  No focal abnormality in the liver or spleen. The stomach, duodenum, and adrenal glands are normal. Kidneys are unremarkable.  No abdominal aortic aneurysm.  No lymphadenopathy in the abdomen.  No abnormal marrow signal within the visualized skeleton.  IMPRESSION: Small volume intraperitoneal free fluid with edema in the retroperitoneal tissues. There is apparent edema within the pancreatic head and uncinate process. Postcontrast imaging shows homogeneous enhancement throughout the pancreatic  parenchyma with no specific features to suggest pancreatic necrosis. No evidence for vascular complication.  No evidence for cholelithiasis or choledocholithiasis. No specific imaging features to suggest pancreas divisum. There is some edema in the head of the pancreas and pancreatic ductal anatomy in the head of pancreas is not well demonstrated. Consider repeat MRCP after resolution of symptoms to more definitively evaluate.   Electronically Signed   By: Kennith Center M.D.   On: 03/02/2013 15:18   US Abdomen Limited  03/02/2013   CLINICAL DATA:  Abdominal pain  EXAM: US ABDOMEN LIMITED - RIGHT UPPER QUADRANT  COMPARISON:  CT abdomen and pelvis March 01, 2013  FINDINGS: Gallbladder  No gallstones or wall thickening visualized. There is no pericholecystic fluid. No sonographic Murphy sign noted.  Common bile duct  Diameter: 4 mm. There is no intrahepatic or extrahepatic biliary duct dilatation.  Liver:  No focal lesion identified. Within normal limits in parenchymal echogenicity.  IMPRESSION: Study within normal limits.   Electronically Signed   By: Bretta Bang M.D.   On: 03/02/2013 08:50    Mr Brent Morales Cm/mrcp  03/02/2013   CLINICAL DATA:  Clinical picture of acute pancreatitis. No risk factors identified to account for the pancreatic inflammation. MRCP ordered to assess for choledocholithiasis or pancreas divisum.  EXAM: MRI ABDOMEN WITHOUT AND WITH CONTRAST (INCLUDING MRCP)  TECHNIQUE: Multiplanar multisequence MR imaging of the abdomen was performed both before and after the administration of intravenous contrast. Heavily T2-weighted images of  the biliary and pancreatic ducts were obtained, and three-dimensional MRCP images were rendered by post processing.  CONTRAST:  16mL MULTIHANCE GADOBENATE DIMEGLUMINE 529 MG/ML IV SOLN  COMPARISON:  CT scan from 03/01/2013.  FINDINGS: There is no intra or extrahepatic biliary duct dilatation. No evidence for gallstones. No filling defects within the  extrahepatic biliary tree to suggest choledocholithiasis.  No pancreatic ductal dilatation. The main pancreatic duct is not well demonstrated in the head of the pancreas. Towards the ampulla, there is a small segment of the pancreatic duct visualized which would suggest normal ductal anatomy. No evidence for an accessory duct or enlargement of the pancreatic head as can be seen with pancreas divisum.  The patient has perihepatic and perisplenic ascites. There is edema within the retroperitoneal soft tissues, consistent with the reported history of pancreatitis. No focal or discrete retroperitoneal fluid collection at this time. Postcontrast imaging confirms patency of the portal vein, superior mesenteric vein, and splenic vein. The celiac axis and superior mesenteric artery are patent.  No focal abnormality in the liver or spleen. The stomach, duodenum, and adrenal glands are normal. Kidneys are unremarkable.  No abdominal aortic aneurysm.  No lymphadenopathy in the abdomen.  No abnormal marrow signal within the visualized skeleton.  IMPRESSION: Small volume intraperitoneal free fluid with edema in the retroperitoneal tissues. There is apparent edema within the pancreatic head and uncinate process. Postcontrast imaging shows homogeneous enhancement throughout the pancreatic parenchyma with no specific features to suggest pancreatic necrosis. No evidence for vascular complication.  No evidence for cholelithiasis or choledocholithiasis. No specific imaging features to suggest pancreas divisum. There is some edema in the head of the pancreas and pancreatic ductal anatomy in the head of pancreas is not well demonstrated. Consider repeat MRCP after resolution of symptoms to more definitively evaluate.   Electronically Signed   By: Kennith Center M.D.   On: 03/02/2013 15:18     Assessment / Plan:    74. 37 year old male with acute pancreatitis of unknown etiology (refer to yesterday's consult note). Lipase 830 /  amylase 462 yesterday. Serum calcium normal. Triglycerides normal. MRCP without suggestion of biliary dilation or pancreatic divisum. Normal LFTs. IgG 4 level pending. Afebrile this am, HCT 37%. IVF at 126ml/hr. Still having significant upper abdominal pain. Continue clears, supportive care.   2. Abnormal esophagus on CTscan, ? Esophagitis or hiatal hernia. Getting BID IV PPI. Findings can be evaluated outpatient at later date.    LOS: 2 days   Willette Cluster  03/03/2013, 9:49 AM  GI Attending Note  I have personally taken an interval history, reviewed the chart, and examined the patient.  I agree with the extender's note, impression and recommendations.  So far no etiology for pancreatitis has been determined.  Barbette Hair. Arlyce Dice, MD, [facility] [phone]

## 2013-03-03 NOTE — Consult Note (Signed)
GI Attending Note   Chart was reviewed and patient was examined. X-rays and lab were reviewed.    I agree with management and plans.  Trev Boley D. Arnett Galindez, M.D., FACG [facility] Cell [phone]  

## 2013-03-04 DIAGNOSIS — F411 Generalized anxiety disorder: Secondary | ICD-10-CM | POA: Diagnosis not present

## 2013-03-04 DIAGNOSIS — F329 Major depressive disorder, single episode, unspecified: Secondary | ICD-10-CM | POA: Diagnosis not present

## 2013-03-04 DIAGNOSIS — R109 Unspecified abdominal pain: Secondary | ICD-10-CM | POA: Diagnosis not present

## 2013-03-04 DIAGNOSIS — K859 Acute pancreatitis without necrosis or infection, unspecified: Secondary | ICD-10-CM | POA: Diagnosis not present

## 2013-03-04 LAB — BASIC METABOLIC PANEL
BUN: 3 mg/dL — ABNORMAL LOW (ref 6–23)
CO2: 23 mEq/L (ref 19–32)
Chloride: 106 mEq/L (ref 96–112)
Creatinine, Ser: 0.68 mg/dL (ref 0.50–1.35)
GFR calc non Af Amer: >90 mL/min (ref >90)
Glucose, Bld: 81 mg/dL (ref 70–99)
Potassium: 3.3 mEq/L — ABNORMAL LOW (ref 3.5–5.1)

## 2013-03-04 LAB — CBC
HCT: 34.5 % — ABNORMAL LOW (ref 39.0–52.0)
Hemoglobin: 11.9 g/dL — ABNORMAL LOW (ref 13.0–17.0)
MCHC: 34.5 g/dL (ref 30.0–36.0)
RBC: 3.71 MIL/uL — ABNORMAL LOW (ref 4.22–5.81)
RDW: 13.8 % (ref 11.5–15.5)
WBC: 10.9 10*3/uL — ABNORMAL HIGH (ref 4.0–10.5)

## 2013-03-04 LAB — LIPASE, BLOOD: Lipase: 222 U/L — ABNORMAL HIGH (ref 11–59)

## 2013-03-04 MED ORDER — NICOTINE 21 MG/24HR TD PT24: 21.0000 mg | MEDICATED_PATCH | Freq: Every day | TRANSDERMAL | Status: DC

## 2013-03-04 MED ORDER — SIMETHICONE 80 MG PO CHEW: 80.0000 mg | CHEWABLE_TABLET | Freq: Four times a day (QID) | ORAL | Status: DC | PRN

## 2013-03-04 MED ORDER — PANTOPRAZOLE SODIUM 40 MG PO TBEC: 40.0000 mg | DELAYED_RELEASE_TABLET | Freq: Every day | ORAL | Status: DC

## 2013-03-04 MED ORDER — ALPRAZOLAM 0.5 MG PO TABS: 0.5000 mg | ORAL_TABLET | Freq: Three times a day (TID) | ORAL | Status: DC | PRN

## 2013-03-04 MED ORDER — HYDROMORPHONE HCL 2 MG PO TABS: 2.0000 mg | ORAL_TABLET | ORAL | Status: DC | PRN

## 2013-03-04 MED ORDER — OXYCODONE-ACETAMINOPHEN 7.5-325 MG PO TABS: 1.0000 | ORAL_TABLET | ORAL | Status: DC | PRN

## 2013-03-04 MED ORDER — POTASSIUM CHLORIDE CRYS ER 20 MEQ PO TBCR
40.0000 meq | EXTENDED_RELEASE_TABLET | Freq: Once | ORAL | Status: AC
  Administered 2013-03-04: 40 meq via ORAL
  Filled 2013-03-04 (×2): qty 2

## 2013-03-04 NOTE — Discharge Summary (Signed)
Physician Discharge Summary  Brent Morales. ZOX:[id number] DOB: [birth year] DOA: 03/01/2013  PCP: Loreen Freud, DO  Admit date: 03/01/2013 Discharge date: 03/04/2013  Recommendations for Outpatient Follow-up:  1. Continue protonix 40 mg daily 2. Please follow up with GI; under follow up section will provide phone number for you to call and make an appt  Discharge Diagnoses:  Active Problems:   ANXIETY   DRUG DEPENDENCE   SPINAL STENOSIS, CERVICAL   ABDOMINAL PAIN OTHER SPECIFIED SITE   Tobacco abuse disorder   Pancreatitis   Abdominal pain    Discharge Condition: medically stable for discharge home today   Diet recommendation: as tolerated   History of present illness:  37 y.o. male with PMH of Chronic neck and back pain on narcotics, h/o bilateral inguinal hernia repair, presented to [facility] ED 03/01/2013 with 1 day history of severe peri-umbilical abdominal pain associated with nausea, emesis. Vomitus is non bloody and non bilious. In ER, pt received 3 doses of IV narcotics. Lipase 157, Ct abd pelvis showed mild circumferential thickening in distal esophagus, some fullness in the body of the pancreas and testicular US was normal.   Assessment/Plan:   Principal Problem:  Acute pancreatitis  - on admission lipase was 157 but then acutely elevated at about 800; normal LFT's and triglycerides; will obtain ethanol and UDS since it has not been collected on the admission.  - appreciate GI following  - had MRCP but no clear cause of pancreatitis established  - repeat lipase level trended down to roughly 200   Code Status: full code  Family Communication: no family at the bedside    Consultants:  GI Procedures:  None  Antibiotics   None   Signed:  Manson Passey, MD  [facility] 03/04/2013, 10:30 AM  Pager #: [phone]   Discharge Exam: Filed Vitals:   03/04/13 0442  BP: 139/79  Pulse: 64  Temp: 99.3 F (37.4 C)  Resp: 16   Filed Vitals:    03/03/13 0525 03/03/13 1417 03/03/13 2045 03/04/13 0442  BP: 120/64 136/80 135/85 139/79  Pulse: 82 75 74 64  Temp: 98.4 F (36.9 C) 98.3 F (36.8 C) 99.3 F (37.4 C) 99.3 F (37.4 C)  TempSrc: Oral Oral Oral Oral  Resp: 16 18 18 16   Height:      Weight:      SpO2: 98% 100% 99% 98%    General: Pt is alert, follows commands appropriately, not in acute distress Cardiovascular: Regular rate and rhythm, S1/S2 +, no murmurs, no rubs, no gallops Respiratory: Clear to auscultation bilaterally, no wheezing, no crackles, no rhonchi Abdominal: Soft, non tender, non distended, bowel sounds +, no guarding Extremities: no edema, no cyanosis, pulses palpable bilaterally DP and PT Neuro: Grossly nonfocal  Discharge Instructions  Discharge Orders   Future Orders Complete By Expires   Call MD for:  difficulty breathing, headache or visual disturbances  As directed    Call MD for:  persistant dizziness or light-headedness  As directed    Call MD for:  persistant nausea and vomiting  As directed    Call MD for:  severe uncontrolled pain  As directed    Diet - low sodium heart healthy  As directed    Discharge instructions  As directed    Comments:     1. Continue protonix 40 mg daily 2. Please follow up with GI; under follow up section will provide phone number for you to call and make an appt  Increase activity slowly  As directed        Medication List         ALPRAZolam 0.5 MG tablet  Commonly known as:  XANAX  Take 1 tablet (0.5 mg total) by mouth 3 (three) times daily as needed. For anxiety     calcium carbonate 500 MG chewable tablet  Commonly known as:  TUMS - dosed in mg elemental calcium  Chew 1 tablet by mouth daily.     HYDROmorphone 2 MG tablet  Commonly known as:  DILAUDID  Take 1 tablet (2 mg total) by mouth every 4 (four) hours as needed for severe pain.     nicotine 21 mg/24hr patch  Commonly known as:  NICODERM CQ - dosed in mg/24 hours  Place 1 patch (21 mg  total) onto the skin daily.     oxyCODONE-acetaminophen 7.5-325 MG per tablet  Commonly known as:  PERCOCET  Take 1 tablet by mouth every 4 (four) hours as needed for pain.     pantoprazole 40 MG tablet  Commonly known as:  PROTONIX  Take 1 tablet (40 mg total) by mouth daily.     simethicone 80 MG chewable tablet  Commonly known as:  MYLICON  Chew 1 tablet (80 mg total) by mouth every 6 (six) hours as needed for flatulence.           Follow-up Information   Follow up with Melvia Heaps, MD. Schedule an appointment as soon as possible for a visit in 2 weeks.   Specialty:  Gastroenterology   Contact information:   [address] [place] [address] [phone]        The results of significant diagnostics from this hospitalization (including imaging, microbiology, ancillary and laboratory) are listed below for reference.    Significant Diagnostic Studies: US Scrotum  03/01/2013   CLINICAL DATA:  Scrotal pain.  EXAM: SCROTAL ULTRASOUND  DOPPLER ULTRASOUND OF THE TESTICLES  TECHNIQUE: Complete ultrasound examination of the testicles, epididymis, and other scrotal structures was performed. Color and spectral Doppler ultrasound were also utilized to evaluate blood flow to the testicles.  COMPARISON:  CT 03/01/2013  FINDINGS: Right testicle  Measurements: 4.7 x 2.5 x 2.9 cm. No mass or microlithiasis visualized.  Left testicle  Measurements: 4.5 x 2.1 x 2.9 cm. No mass or microlithiasis visualized.  Right epididymis:  Normal in size and appearance.  Left epididymis:  Normal in size and appearance.  Hydrocele:  Small bilateral  Varicocele:  None visualized.  Pulsed Doppler interrogation of both testes demonstrates low resistance arterial and venous waveforms bilaterally.  IMPRESSION: Unremarkable study.   Electronically Signed   By: Charlett Nose M.D.   On: 03/01/2013 16:28   Ct Abdomen Pelvis W Contrast  03/01/2013   CLINICAL DATA:  History of inguinal hernia repair. Generalized  abdominal pain. Low back pain. Testicular pain. Dysuria.  EXAM: CT ABDOMEN AND PELVIS WITH CONTRAST  TECHNIQUE: Multidetector CT imaging of the abdomen and pelvis was performed using the standard protocol following bolus administration of intravenous contrast.  CONTRAST:  OMNIPAQUE IOHEXOL 300 MG/ML  SOLN  COMPARISON:  08/29/2011.  FINDINGS: No focal abnormality is seen in the liver or spleen. There is mild circumferential wall thickening in the distal esophagus. Stomach is unremarkable. Duodenum, pancreas, gallbladder, and adrenal glands have normal imaging features. There is some subtle ill-defined low-attenuation in the uncinate process of the pancreas which appears new in the interval. The pancreatic body appears somewhat ill-defined.  No abdominal  aortic aneurysm. There a is no free fluid or lymphadenopathy in the abdomen.  Imaging through the pelvis shows no free intraperitoneal fluid. There is no pelvic sidewall lymphadenopathy. Bladder is normal in appearance. Prostate gland is unremarkable.  No substantial diverticular disease in the colon. There is no colonic diverticulitis. The terminal ileum wake is normal. The appendix is normal. There is some high density material within a small bowel loop of the central pelvis on image 56, of indeterminate significance. This is probably ingested material.  No small bowel obstruction.  No colonic obstruction.  Bone windows reveal no worrisome lytic or sclerotic osseous lesions.  IMPRESSION: Mild circumferential wall thickening in the distal esophagus. Esophagitis would be a consideration.  Subtle, ill-defined low-attenuation in the uncinate process of the pancreas with a question of some fullness in the body the pancreas. This is best appreciated when comparing back to the earlier exam. A component of underlying pancreatitis cannot be excluded by imaging. Clinical picture is not consistent with pancreatitis, consider follow-up outpatient MRI of the pancreas  without and with contrast to further evaluate the uncinate process.   Electronically Signed   By: Kennith Center M.D.   On: 03/01/2013 15:04   Mr 3d Recon At Scanner  03/02/2013   CLINICAL DATA:  Clinical picture of acute pancreatitis. No risk factors identified to account for the pancreatic inflammation. MRCP ordered to assess for choledocholithiasis or pancreas divisum.  EXAM: MRI ABDOMEN WITHOUT AND WITH CONTRAST (INCLUDING MRCP)  TECHNIQUE: Multiplanar multisequence MR imaging of the abdomen was performed both before and after the administration of intravenous contrast. Heavily T2-weighted images of the biliary and pancreatic ducts were obtained, and three-dimensional MRCP images were rendered by post processing.  CONTRAST:  16mL MULTIHANCE GADOBENATE DIMEGLUMINE 529 MG/ML IV SOLN  COMPARISON:  CT scan from 03/01/2013.  FINDINGS: There is no intra or extrahepatic biliary duct dilatation. No evidence for gallstones. No filling defects within the extrahepatic biliary tree to suggest choledocholithiasis.  No pancreatic ductal dilatation. The main pancreatic duct is not well demonstrated in the head of the pancreas. Towards the ampulla, there is a small segment of the pancreatic duct visualized which would suggest normal ductal anatomy. No evidence for an accessory duct or enlargement of the pancreatic head as can be seen with pancreas divisum.  The patient has perihepatic and perisplenic ascites. There is edema within the retroperitoneal soft tissues, consistent with the reported history of pancreatitis. No focal or discrete retroperitoneal fluid collection at this time. Postcontrast imaging confirms patency of the portal vein, superior mesenteric vein, and splenic vein. The celiac axis and superior mesenteric artery are patent.  No focal abnormality in the liver or spleen. The stomach, duodenum, and adrenal glands are normal. Kidneys are unremarkable.  No abdominal aortic aneurysm.  No lymphadenopathy in the  abdomen.  No abnormal marrow signal within the visualized skeleton.  IMPRESSION: Small volume intraperitoneal free fluid with edema in the retroperitoneal tissues. There is apparent edema within the pancreatic head and uncinate process. Postcontrast imaging shows homogeneous enhancement throughout the pancreatic parenchyma with no specific features to suggest pancreatic necrosis. No evidence for vascular complication.  No evidence for cholelithiasis or choledocholithiasis. No specific imaging features to suggest pancreas divisum. There is some edema in the head of the pancreas and pancreatic ductal anatomy in the head of pancreas is not well demonstrated. Consider repeat MRCP after resolution of symptoms to more definitively evaluate.   Electronically Signed   By: Jamison Oka.D.  On: 03/02/2013 15:18   US Abdomen Limited  03/02/2013   CLINICAL DATA:  Abdominal pain  EXAM: US ABDOMEN LIMITED - RIGHT UPPER QUADRANT  COMPARISON:  CT abdomen and pelvis March 01, 2013  FINDINGS: Gallbladder  No gallstones or wall thickening visualized. There is no pericholecystic fluid. No sonographic Murphy sign noted.  Common bile duct  Diameter: 4 mm. There is no intrahepatic or extrahepatic biliary duct dilatation.  Liver:  No focal lesion identified. Within normal limits in parenchymal echogenicity.  IMPRESSION: Study within normal limits.   Electronically Signed   By: Bretta Bang M.D.   On: 03/02/2013 08:50   [place] Art/ven Flow Abd Pelv Doppler  03/01/2013   CLINICAL DATA:  Scrotal pain.  EXAM: SCROTAL ULTRASOUND  DOPPLER ULTRASOUND OF THE TESTICLES  TECHNIQUE: Complete ultrasound examination of the testicles, epididymis, and other scrotal structures was performed. Color and spectral Doppler ultrasound were also utilized to evaluate blood flow to the testicles.  COMPARISON:  CT 03/01/2013  FINDINGS: Right testicle  Measurements: 4.7 x 2.5 x 2.9 cm. No mass or microlithiasis visualized.  Left testicle   Measurements: 4.5 x 2.1 x 2.9 cm. No mass or microlithiasis visualized.  Right epididymis:  Normal in size and appearance.  Left epididymis:  Normal in size and appearance.  Hydrocele:  Small bilateral  Varicocele:  None visualized.  Pulsed Doppler interrogation of both testes demonstrates low resistance arterial and venous waveforms bilaterally.  IMPRESSION: Unremarkable study.   Electronically Signed   By: Charlett Nose M.D.   On: 03/01/2013 16:28   Mr Jorja Loa Cm/mrcp  03/02/2013   CLINICAL DATA:  Clinical picture of acute pancreatitis. No risk factors identified to account for the pancreatic inflammation. MRCP ordered to assess for choledocholithiasis or pancreas divisum.  EXAM: MRI ABDOMEN WITHOUT AND WITH CONTRAST (INCLUDING MRCP)  TECHNIQUE: Multiplanar multisequence MR imaging of the abdomen was performed both before and after the administration of intravenous contrast. Heavily T2-weighted images of the biliary and pancreatic ducts were obtained, and three-dimensional MRCP images were rendered by post processing.  CONTRAST:  16mL MULTIHANCE GADOBENATE DIMEGLUMINE 529 MG/ML IV SOLN  COMPARISON:  CT scan from 03/01/2013.  FINDINGS: There is no intra or extrahepatic biliary duct dilatation. No evidence for gallstones. No filling defects within the extrahepatic biliary tree to suggest choledocholithiasis.  No pancreatic ductal dilatation. The main pancreatic duct is not well demonstrated in the head of the pancreas. Towards the ampulla, there is a small segment of the pancreatic duct visualized which would suggest normal ductal anatomy. No evidence for an accessory duct or enlargement of the pancreatic head as can be seen with pancreas divisum.  The patient has perihepatic and perisplenic ascites. There is edema within the retroperitoneal soft tissues, consistent with the reported history of pancreatitis. No focal or discrete retroperitoneal fluid collection at this time. Postcontrast imaging confirms patency of  the portal vein, superior mesenteric vein, and splenic vein. The celiac axis and superior mesenteric artery are patent.  No focal abnormality in the liver or spleen. The stomach, duodenum, and adrenal glands are normal. Kidneys are unremarkable.  No abdominal aortic aneurysm.  No lymphadenopathy in the abdomen.  No abnormal marrow signal within the visualized skeleton.  IMPRESSION: Small volume intraperitoneal free fluid with edema in the retroperitoneal tissues. There is apparent edema within the pancreatic head and uncinate process. Postcontrast imaging shows homogeneous enhancement throughout the pancreatic parenchyma with no specific features to suggest pancreatic necrosis. No evidence for vascular complication.  No  evidence for cholelithiasis or choledocholithiasis. No specific imaging features to suggest pancreas divisum. There is some edema in the head of the pancreas and pancreatic ductal anatomy in the head of pancreas is not well demonstrated. Consider repeat MRCP after resolution of symptoms to more definitively evaluate.   Electronically Signed   By: Kennith Center M.D.   On: 03/02/2013 15:18    Microbiology: No results found for this or any previous visit (from the past 240 hour(s)).   Labs: Basic Metabolic Panel:  Recent Labs Lab 03/01/13 1250 03/02/13 0429 03/03/13 0455 03/04/13 0415  NA 141 141 137 138  K 3.7 3.7 3.4* 3.3*  CL 104 107 104 106  CO2 26 25 23 23   GLUCOSE 99 92 95 81  BUN 5* 5* 5* 3*  CREATININE 0.82 0.78 0.67 0.68  CALCIUM 9.3 8.5 8.2* 8.4   Liver Function Tests:  Recent Labs Lab 03/01/13 1250 03/03/13 0455  AST 16 13  ALT 11 7  ALKPHOS 108 82  BILITOT 0.3 0.4  PROT 7.4 5.9*  ALBUMIN 4.0 3.0*    Recent Labs Lab 03/01/13 1250 03/02/13 0430 03/04/13 0415  LIPASE 157* 830* 222*  AMYLASE  --  462*  --    No results found for this basename: AMMONIA,  in the last 168 hours CBC:  Recent Labs Lab 03/01/13 1250 03/02/13 0429 03/03/13 0455  03/04/13 0415  WBC 11.2* 13.4* 12.1* 10.9*  NEUTROABS 8.7*  --   --   --   HGB 15.0 12.7* 12.9* 11.9*  HCT 45.4 37.9* 37.3* 34.5*  MCV 95.6 94.3 94.0 93.0  PLT 238 191 192 157   Cardiac Enzymes: No results found for this basename: CKTOTAL, CKMB, CKMBINDEX, TROPONINI,  in the last 168 hours BNP: BNP (last 3 results) No results found for this basename: PROBNP,  in the last 8760 hours CBG: No results found for this basename: GLUCAP,  in the last 168 hours  Time coordinating discharge: Over 30 minutes

## 2013-03-04 NOTE — Progress Notes (Signed)
Progress Note   Subjective  Abdominal pain is decreasing.  Tolerating clear liquids.   Objective   Vital signs in last 24 hours: Temp:  [98.3 F (36.8 C)-99.3 F (37.4 C)] 99.3 F (37.4 C) (12/25 0442) Pulse Rate:  [64-75] 64 (12/25 0442) Resp:  [16-18] 16 (12/25 0442) BP: (135-139)/(79-85) 139/79 mmHg (12/25 0442) SpO2:  [98 %-100 %] 98 % (12/25 0442) Last BM Date: 02/28/13 General:    Pleasant white male in NAD. Father at bedside Heart:  Regular rate and rhythm; no murmurs Lungs: Respirations even and unlabored, diminished breath sounds at bilateral bases. Abdomen:  Soft, mildly distended, mild diffuse upper tenderness. Normal bowel sounds. Extremities:  1+ BLE edema.  Neurologic:  Alert and oriented,  grossly normal neurologically. Psych:  Cooperative. Normal mood and affect.  Intake/Output from previous day: 12/24 0701 - 12/25 0700 In: 1290 [P.O.:240; I.V.:1050] Out: 2500 [Urine:2500] Intake/Output this shift:    Lab Results:  Recent Labs  03/02/13 0429 03/03/13 0455 03/04/13 0415  WBC 13.4* 12.1* 10.9*  HGB 12.7* 12.9* 11.9*  HCT 37.9* 37.3* 34.5*  PLT 191 192 157   BMET  Recent Labs  03/02/13 0429 03/03/13 0455 03/04/13 0415  NA 141 137 138  K 3.7 3.4* 3.3*  CL 107 104 106  CO2 25 23 23   GLUCOSE 92 95 81  BUN 5* 5* 3*  CREATININE 0.78 0.67 0.68  CALCIUM 8.5 8.2* 8.4   LFT  Recent Labs  03/03/13 0455  PROT 5.9*  ALBUMIN 3.0*  AST 13  ALT 7  ALKPHOS 82  BILITOT 0.4    Studies/Results:  Ct Abdomen Pelvis W Contrast  03/01/2013   CLINICAL DATA:  History of inguinal hernia repair. Generalized abdominal pain. Low back pain. Testicular pain. Dysuria.  EXAM: CT ABDOMEN AND PELVIS WITH CONTRAST  TECHNIQUE: Multidetector CT imaging of the abdomen and pelvis was performed using the standard protocol following bolus administration of intravenous contrast.  CONTRAST:  OMNIPAQUE IOHEXOL 300 MG/ML  SOLN  COMPARISON:  08/29/2011.   FINDINGS: No focal abnormality is seen in the liver or spleen. There is mild circumferential wall thickening in the distal esophagus. Stomach is unremarkable. Duodenum, pancreas, gallbladder, and adrenal glands have normal imaging features. There is some subtle ill-defined low-attenuation in the uncinate process of the pancreas which appears new in the interval. The pancreatic body appears somewhat ill-defined.  No abdominal aortic aneurysm. There a is no free fluid or lymphadenopathy in the abdomen.  Imaging through the pelvis shows no free intraperitoneal fluid. There is no pelvic sidewall lymphadenopathy. Bladder is normal in appearance. Prostate gland is unremarkable.  No substantial diverticular disease in the colon. There is no colonic diverticulitis. The terminal ileum wake is normal. The appendix is normal. There is some high density material within a small bowel loop of the central pelvis on image 56, of indeterminate significance. This is probably ingested material.  No small bowel obstruction.  No colonic obstruction.  Bone windows reveal no worrisome lytic or sclerotic osseous lesions.  IMPRESSION: Mild circumferential wall thickening in the distal esophagus. Esophagitis would be a consideration.  Subtle, ill-defined low-attenuation in the uncinate process of the pancreas with a question of some fullness in the body the pancreas. This is best appreciated when comparing back to the earlier exam. A component of underlying pancreatitis cannot be excluded by imaging. Clinical picture is not consistent with pancreatitis, consider follow-up outpatient MRI of the pancreas without and with contrast to further evaluate the  uncinate process.   Electronically Signed   By: Kennith Center M.D.   On: 03/01/2013 15:04   Mr 3d Recon At Scanner  03/02/2013   CLINICAL DATA:  Clinical picture of acute pancreatitis. No risk factors identified to account for the pancreatic inflammation. MRCP ordered to assess for  choledocholithiasis or pancreas divisum.  EXAM: MRI ABDOMEN WITHOUT AND WITH CONTRAST (INCLUDING MRCP)  TECHNIQUE: Multiplanar multisequence MR imaging of the abdomen was performed both before and after the administration of intravenous contrast. Heavily T2-weighted images of the biliary and pancreatic ducts were obtained, and three-dimensional MRCP images were rendered by post processing.  CONTRAST:  16mL MULTIHANCE GADOBENATE DIMEGLUMINE 529 MG/ML IV SOLN  COMPARISON:  CT scan from 03/01/2013.  FINDINGS: There is no intra or extrahepatic biliary duct dilatation. No evidence for gallstones. No filling defects within the extrahepatic biliary tree to suggest choledocholithiasis.  No pancreatic ductal dilatation. The main pancreatic duct is not well demonstrated in the head of the pancreas. Towards the ampulla, there is a small segment of the pancreatic duct visualized which would suggest normal ductal anatomy. No evidence for an accessory duct or enlargement of the pancreatic head as can be seen with pancreas divisum.  The patient has perihepatic and perisplenic ascites. There is edema within the retroperitoneal soft tissues, consistent with the reported history of pancreatitis. No focal or discrete retroperitoneal fluid collection at this time. Postcontrast imaging confirms patency of the portal vein, superior mesenteric vein, and splenic vein. The celiac axis and superior mesenteric artery are patent.  No focal abnormality in the liver or spleen. The stomach, duodenum, and adrenal glands are normal. Kidneys are unremarkable.  No abdominal aortic aneurysm.  No lymphadenopathy in the abdomen.  No abnormal marrow signal within the visualized skeleton.  IMPRESSION: Small volume intraperitoneal free fluid with edema in the retroperitoneal tissues. There is apparent edema within the pancreatic head and uncinate process. Postcontrast imaging shows homogeneous enhancement throughout the pancreatic parenchyma with no  specific features to suggest pancreatic necrosis. No evidence for vascular complication.  No evidence for cholelithiasis or choledocholithiasis. No specific imaging features to suggest pancreas divisum. There is some edema in the head of the pancreas and pancreatic ductal anatomy in the head of pancreas is not well demonstrated. Consider repeat MRCP after resolution of symptoms to more definitively evaluate.   Electronically Signed   By: Kennith Center M.D.   On: 03/02/2013 15:18   US Abdomen Limited  03/02/2013   CLINICAL DATA:  Abdominal pain  EXAM: US ABDOMEN LIMITED - RIGHT UPPER QUADRANT  COMPARISON:  CT abdomen and pelvis March 01, 2013  FINDINGS: Gallbladder  No gallstones or wall thickening visualized. There is no pericholecystic fluid. No sonographic Murphy sign noted.  Common bile duct  Diameter: 4 mm. There is no intrahepatic or extrahepatic biliary duct dilatation.  Liver:  No focal lesion identified. Within normal limits in parenchymal echogenicity.  IMPRESSION: Study within normal limits.   Electronically Signed   By: Bretta Bang M.D.   On: 03/02/2013 08:50    Mr Jorja Loa Cm/mrcp  03/02/2013   CLINICAL DATA:  Clinical picture of acute pancreatitis. No risk factors identified to account for the pancreatic inflammation. MRCP ordered to assess for choledocholithiasis or pancreas divisum.  EXAM: MRI ABDOMEN WITHOUT AND WITH CONTRAST (INCLUDING MRCP)  TECHNIQUE: Multiplanar multisequence MR imaging of the abdomen was performed both before and after the administration of intravenous contrast. Heavily T2-weighted images of the biliary and pancreatic ducts were obtained,  and three-dimensional MRCP images were rendered by post processing.  CONTRAST:  16mL MULTIHANCE GADOBENATE DIMEGLUMINE 529 MG/ML IV SOLN  COMPARISON:  CT scan from 03/01/2013.  FINDINGS: There is no intra or extrahepatic biliary duct dilatation. No evidence for gallstones. No filling defects within the extrahepatic biliary tree  to suggest choledocholithiasis.  No pancreatic ductal dilatation. The main pancreatic duct is not well demonstrated in the head of the pancreas. Towards the ampulla, there is a small segment of the pancreatic duct visualized which would suggest normal ductal anatomy. No evidence for an accessory duct or enlargement of the pancreatic head as can be seen with pancreas divisum.  The patient has perihepatic and perisplenic ascites. There is edema within the retroperitoneal soft tissues, consistent with the reported history of pancreatitis. No focal or discrete retroperitoneal fluid collection at this time. Postcontrast imaging confirms patency of the portal vein, superior mesenteric vein, and splenic vein. The celiac axis and superior mesenteric artery are patent.  No focal abnormality in the liver or spleen. The stomach, duodenum, and adrenal glands are normal. Kidneys are unremarkable.  No abdominal aortic aneurysm.  No lymphadenopathy in the abdomen.  No abnormal marrow signal within the visualized skeleton.  IMPRESSION: Small volume intraperitoneal free fluid with edema in the retroperitoneal tissues. There is apparent edema within the pancreatic head and uncinate process. Postcontrast imaging shows homogeneous enhancement throughout the pancreatic parenchyma with no specific features to suggest pancreatic necrosis. No evidence for vascular complication.  No evidence for cholelithiasis or choledocholithiasis. No specific imaging features to suggest pancreas divisum. There is some edema in the head of the pancreas and pancreatic ductal anatomy in the head of pancreas is not well demonstrated. Consider repeat MRCP after resolution of symptoms to more definitively evaluate.   Electronically Signed   By: Kennith Center M.D.   On: 03/02/2013 15:18     Assessment / Plan:    1. acute pancreatitis of unclear etiology.  Clinically pancreatitis is subsiding.  Awaiting IgG 4 level.  May advance diet  2. Abnormal  esophagus on CTscan, ? Esophagitis or hiatal hernia..  Findings can be evaluated outpatient at later date.  May switch to by mouth PPI therapy.  Signing off.  Please ask patient to make followup appointment in the office.    LOS: 3 days   Melvia Heaps  03/04/2013, 10:04 AM

## 2013-03-04 NOTE — Progress Notes (Signed)
Patient was stable at time of discharge. Reviewed discharge packet and gave him prescriptions. Patient verbalized understanding.

## 2013-03-11 LAB — IGG 1, 2, 3, AND 4

## 2013-04-24 ENCOUNTER — Other Ambulatory Visit: Payer: Self-pay | Admitting: Internal Medicine

## 2013-05-17 ENCOUNTER — Encounter (HOSPITAL_COMMUNITY): Payer: Self-pay | Admitting: Emergency Medicine

## 2013-05-17 ENCOUNTER — Emergency Department (INDEPENDENT_AMBULATORY_CARE_PROVIDER_SITE_OTHER)
Admission: EM | Admit: 2013-05-17 | Discharge: 2013-05-17 | Disposition: A | Discharge status: Home / Self Care | Payer: Medicare Other | Source: Home / Self Care | Attending: Family Medicine | Admitting: Family Medicine

## 2013-05-17 ENCOUNTER — Ambulatory Visit (HOSPITAL_COMMUNITY): Payer: Medicare Other | Attending: Family Medicine

## 2013-05-17 DIAGNOSIS — R079 Chest pain, unspecified: Secondary | ICD-10-CM | POA: Diagnosis not present

## 2013-05-17 DIAGNOSIS — R059 Cough, unspecified: Secondary | ICD-10-CM | POA: Insufficient documentation

## 2013-05-17 DIAGNOSIS — R071 Chest pain on breathing: Secondary | ICD-10-CM

## 2013-05-17 DIAGNOSIS — R0789 Other chest pain: Secondary | ICD-10-CM

## 2013-05-17 DIAGNOSIS — R05 Cough: Secondary | ICD-10-CM | POA: Insufficient documentation

## 2013-05-17 HISTORY — DX: Spinal stenosis, cervical region: M48.02

## 2013-05-17 MED ORDER — DICLOFENAC POTASSIUM 50 MG PO TABS: 50.0000 mg | ORAL_TABLET | Freq: Three times a day (TID) | ORAL | Status: DC

## 2013-05-17 MED ORDER — CLINDAMYCIN HCL 300 MG PO CAPS: 300.0000 mg | ORAL_CAPSULE | Freq: Three times a day (TID) | ORAL | Status: DC

## 2013-05-17 NOTE — ED Provider Notes (Signed)
CSN: [id number]     Arrival date & time 05/17/13  1601 History   First MD Initiated Contact with Patient 05/17/13 1646     Chief Complaint  Patient presents with  . Muscle Pain   (Consider location/radiation/quality/duration/timing/severity/associated sxs/prior Treatment) Patient is a 38 y.o. male presenting with chest pain. The history is provided by the patient.  Chest Pain Pain location:  L lateral chest Pain quality: sharp   Pain radiates to:  Does not radiate Pain radiates to the back: no   Pain severity:  Mild Onset quality:  Gradual Duration:  6 months Timing:  Constant Progression:  Unchanged Chronicity:  New Context comment:  Smoker, assoc left neck pain., recent tooth extract by dentist. Associated symptoms: no palpitations     Past Medical History  Diagnosis Date  . Hernia     Bilateral Inguinal  . Cervical stenosis of spinal canal    Past Surgical History  Procedure Laterality Date  . Inguinal hernia repair    . Thumb surgery     Family History  Problem Relation Age of Onset  . Hypertension Mother   . Heart disease Father   . Crohn's disease Maternal Aunt   . Colon cancer Paternal Grandfather    History  Substance Use Topics  . Smoking status: Current Every Day Smoker -- 1.00 packs/day for 25 years  . Smokeless tobacco: Never Used  . Alcohol Use: No    Review of Systems  Constitutional: Negative.   HENT: Positive for dental problem.   Respiratory: Negative.   Cardiovascular: Positive for chest pain. Negative for palpitations.  Gastrointestinal: Negative.   Musculoskeletal: Positive for neck pain.    Allergies  Iohexol  Home Medications   Current Outpatient Rx  Name  Route  Sig  Dispense  Refill  . ALPRAZolam (XANAX) 0.5 MG tablet   Oral   Take 1 tablet (0.5 mg total) by mouth 3 (three) times daily as needed. For anxiety   45 tablet   0   . calcium carbonate (TUMS - DOSED IN MG ELEMENTAL CALCIUM) 500 MG chewable tablet   Oral   Chew 1  tablet by mouth daily.         Marland Kitchen. oxyCODONE-acetaminophen (PERCOCET) 7.5-325 MG per tablet   Oral   Take 1 tablet by mouth every 4 (four) hours as needed for pain.   45 tablet   0   . clindamycin (CLEOCIN) 300 MG capsule   Oral   Take 1 capsule (300 mg total) by mouth 3 (three) times daily.   21 capsule   0   . diclofenac (CATAFLAM) 50 MG tablet   Oral   Take 1 tablet (50 mg total) by mouth 3 (three) times daily.   30 tablet   0   . HYDROmorphone (DILAUDID) 2 MG tablet   Oral   Take 1 tablet (2 mg total) by mouth every 4 (four) hours as needed for severe pain.   45 tablet   0   . nicotine (NICODERM CQ - DOSED IN MG/24 HOURS) 21 mg/24hr patch   Transdermal   Place 1 patch (21 mg total) onto the skin daily.   28 patch   0   . pantoprazole (PROTONIX) 40 MG tablet   Oral   Take 1 tablet (40 mg total) by mouth daily.   30 tablet   0   . simethicone (MYLICON) 80 MG chewable tablet   Oral   Chew 1 tablet (80 mg total) by mouth every  6 (six) hours as needed for flatulence.   45 tablet   0    BP 138/89  Pulse 99  Temp(Src) 98.2 F (36.8 C) (Oral)  Resp 16  SpO2 99% Physical Exam  Nursing note and vitals reviewed. Constitutional: He is oriented to person, place, and time. He appears well-developed and well-nourished. No distress.  Neck: Normal range of motion. Neck supple.  Pulmonary/Chest: He has rhonchi. He exhibits tenderness.    Neurological: He is alert and oriented to person, place, and time.  Skin: Skin is warm and dry.    ED Course  Procedures (including critical care time) Labs Review Labs Reviewed - No data to display Imaging Review Dg Chest 2 View  05/17/2013   CLINICAL DATA:  Chest pain.  Cough and congestion.  EXAM: CHEST  2 VIEW  COMPARISON:  03/19/2010  FINDINGS: The heart size and mediastinal contours are within normal limits. Both lungs are clear. The visualized skeletal structures are unremarkable.  IMPRESSION: No active cardiopulmonary  disease.   Electronically Signed   By: Amie Portland M.D.   On: 05/17/2013 17:53   X-rays reviewed and report per radiologist.   MDM   1. Anterior chest wall pain        Linna Hoff, MD 05/17/13 [phone]

## 2013-05-17 NOTE — ED Notes (Addendum)
Neck pain,  chest pain, hip pain x 6 months or more states his regular MD could not se him today

## 2013-05-17 NOTE — Discharge Instructions (Signed)
Take medicine as prescribed and see your dentist or medical doctor if further problems.

## 2013-05-26 ENCOUNTER — Encounter (HOSPITAL_BASED_OUTPATIENT_CLINIC_OR_DEPARTMENT_OTHER): Payer: Self-pay | Admitting: Emergency Medicine

## 2013-05-26 ENCOUNTER — Emergency Department (HOSPITAL_BASED_OUTPATIENT_CLINIC_OR_DEPARTMENT_OTHER)
Admission: EM | Admit: 2013-05-26 | Discharge: 2013-05-26 | Disposition: A | Discharge status: Home / Self Care | Payer: Medicare Other | Attending: Emergency Medicine | Admitting: Emergency Medicine

## 2013-05-26 ENCOUNTER — Emergency Department (HOSPITAL_BASED_OUTPATIENT_CLINIC_OR_DEPARTMENT_OTHER): Payer: Medicare Other

## 2013-05-26 DIAGNOSIS — Z79899 Other long term (current) drug therapy: Secondary | ICD-10-CM | POA: Insufficient documentation

## 2013-05-26 DIAGNOSIS — R109 Unspecified abdominal pain: Secondary | ICD-10-CM | POA: Diagnosis not present

## 2013-05-26 DIAGNOSIS — Z8739 Personal history of other diseases of the musculoskeletal system and connective tissue: Secondary | ICD-10-CM | POA: Insufficient documentation

## 2013-05-26 DIAGNOSIS — Z8719 Personal history of other diseases of the digestive system: Secondary | ICD-10-CM | POA: Diagnosis not present

## 2013-05-26 DIAGNOSIS — F17200 Nicotine dependence, unspecified, uncomplicated: Secondary | ICD-10-CM | POA: Insufficient documentation

## 2013-05-26 DIAGNOSIS — G8929 Other chronic pain: Secondary | ICD-10-CM | POA: Diagnosis not present

## 2013-05-26 DIAGNOSIS — R1032 Left lower quadrant pain: Secondary | ICD-10-CM | POA: Diagnosis not present

## 2013-05-26 LAB — CBC WITH DIFFERENTIAL/PLATELET
BASOS ABS: 0 10*3/uL (ref 0.0–0.1)
Basophils Relative: 0 % (ref 0–1)
EOS ABS: 0.1 10*3/uL (ref 0.0–0.7)
EOS PCT: 1 % (ref 0–5)
HEMATOCRIT: 45.6 % (ref 39.0–52.0)
Hemoglobin: 15.5 g/dL (ref 13.0–17.0)
LYMPHS PCT: 19 % (ref 12–46)
Lymphs Abs: 2.1 10*3/uL (ref 0.7–4.0)
MCH: 32.3 pg (ref 26.0–34.0)
MCHC: 34 g/dL (ref 30.0–36.0)
MCV: 95 fL (ref 78.0–100.0)
MONO ABS: 0.8 10*3/uL (ref 0.1–1.0)
Monocytes Relative: 7 % (ref 3–12)
Neutro Abs: 8.2 10*3/uL — ABNORMAL HIGH (ref 1.7–7.7)
Neutrophils Relative %: 73 % (ref 43–77)
PLATELETS: 251 10*3/uL (ref 150–400)
RBC: 4.8 MIL/uL (ref 4.22–5.81)
RDW: 13.3 % (ref 11.5–15.5)
WBC: 11.2 10*3/uL — AB (ref 4.0–10.5)

## 2013-05-26 LAB — URINALYSIS, ROUTINE W REFLEX MICROSCOPIC
Bilirubin Urine: NEGATIVE
GLUCOSE, UA: NEGATIVE mg/dL
HGB URINE DIPSTICK: NEGATIVE
KETONES UR: NEGATIVE mg/dL
Leukocytes, UA: NEGATIVE
Nitrite: NEGATIVE
PROTEIN: NEGATIVE mg/dL
Specific Gravity, Urine: 1.003 — ABNORMAL LOW (ref 1.005–1.030)
UROBILINOGEN UA: 0.2 mg/dL (ref 0.0–1.0)
pH: 6 (ref 5.0–8.0)

## 2013-05-26 LAB — BASIC METABOLIC PANEL
BUN: 4 mg/dL — ABNORMAL LOW (ref 6–23)
CO2: 28 meq/L (ref 19–32)
CREATININE: 0.9 mg/dL (ref 0.50–1.35)
Calcium: 9.4 mg/dL (ref 8.4–10.5)
Chloride: 103 mEq/L (ref 96–112)
GFR calc Af Amer: >90 mL/min (ref >90)
Glucose, Bld: 99 mg/dL (ref 70–99)
Potassium: 3.9 mEq/L (ref 3.7–5.3)
Sodium: 144 mEq/L (ref 137–147)

## 2013-05-26 LAB — I-STAT CG4 LACTIC ACID, ED: Lactic Acid, Venous: 0.54 mmol/L (ref 0.5–2.2)

## 2013-05-26 MED ORDER — ALPRAZOLAM 0.5 MG PO TABS: 0.5000 mg | ORAL_TABLET | Freq: Three times a day (TID) | ORAL | Status: DC

## 2013-05-26 MED ORDER — OXYCODONE-ACETAMINOPHEN 5-325 MG PO TABS: 1.0000 | ORAL_TABLET | Freq: Once | ORAL | Status: DC

## 2013-05-26 NOTE — ED Notes (Signed)
Pt is driving and unable to find family/friend to pick him up if narcotics are administered.

## 2013-05-26 NOTE — Discharge Instructions (Signed)
Chronic Pain Discharge Instructions  °Emergency care providers appreciate that many patients coming to us are in severe pain and we wish to address their pain in the safest, most responsible manner.  It is important to recognize however, that the proper treatment of chronic pain differs from that of the pain of injuries and acute illnesses.  Our goal is to provide quality, safe, personalized care and we thank you for giving us the opportunity to serve you. °The use of narcotics and related agents for chronic pain syndromes may lead to additional physical and psychological problems.  Nearly as many people die from prescription narcotics each year as die from car crashes.  Additionally, this risk is increased if such prescriptions are obtained from a variety of sources.  Therefore, only your primary care physician or a pain management specialist is able to safely treat such syndromes with narcotic medications long-term.   ° °Documentation revealing such prescriptions have been sought from multiple sources may prohibit us from providing a refill or different narcotic medication.  Your name may be checked first through the [place] Controlled Substances Reporting System.  This database is a record of controlled substance medication prescriptions that the patient has received.  This has been established by [employer] in an effort to eliminate the dangerous, and often life threatening, practice of obtaining multiple prescriptions from different medical providers.  ° °If you have a chronic pain syndrome (i.e. chronic headaches, recurrent back or neck pain, dental pain, abdominal or pelvis pain without a specific diagnosis, or neuropathic pain such as fibromyalgia) or recurrent visits for the same condition without an acute diagnosis, you may be treated with non-narcotics and other non-addictive medicines.  Allergic reactions or negative side effects that may be reported by a patient to such medications will not  typically lead to the use of a narcotic analgesic or other controlled substance as an alternative. °  °Patients managing chronic pain with a personal physician should have provisions in place for breakthrough pain.  If you are in crisis, you should call your physician.  If your physician directs you to the emergency department, please have the doctor call and speak to our attending physician concerning your care. °  °When patients come to the Emergency Department (ED) with acute medical conditions in which the Emergency Department physician feels appropriate to prescribe narcotic or sedating pain medication, the physician will prescribe these in very limited quantities.  The amount of these medications will last only until you can see your primary care physician in his/her office.  Any patient who returns to the ED seeking refills should expect only non-narcotic pain medications.  ° °In the event of an acute medical condition exists and the emergency physician feels it is necessary that the patient be given a narcotic or sedating medication -  a responsible adult driver should be present in the room prior to the medication being given by the nurse. °  °Prescriptions for narcotic or sedating medications that have been lost, stolen or expired will not be refilled in the Emergency Department.   ° °Patients who have chronic pain may receive non-narcotic prescriptions until seen by their primary care physician.  It is every patient’s personal responsibility to maintain active prescriptions with his or her primary care physician or specialist. °

## 2013-05-26 NOTE — ED Notes (Signed)
Pt reports that he saw his PCP today and she would not refill his percocet and xanax.  Pt has a surgical consult on 06-07-13 for hernia repair and is requesting pain meds to get him through until then.

## 2013-05-26 NOTE — ED Provider Notes (Signed)
CSN: [id number]     Arrival date & time 05/26/13  1252 History   First MD Initiated Contact with Patient 05/26/13 1303     Chief Complaint  Patient presents with  . Medication Refill     (Consider location/radiation/quality/duration/timing/severity/associated sxs/prior Treatment) HPI  This is a 38 yo male who presents with left lower quadrant pain.  Patient reports that he saw his primary care physician today who would not refill his pain or anxiety medication. He takes Percocet and Xanax. Patient reports history of chronic pain for cervical stenosis. He has been on Ewing a pain medication since 2008. He has also been in pain clinics.  He ran out of his Percocet and Xanax last night. Patient reports worsening left lower quadrant pain. States it is sharp and nonradiating. Denies any urinary symptoms or urethral discharge. I reviewed the patient's chart. He has chronic left lower quadrant pain following his bilateral hernia repair 2 years ago. He continues to see surgery. He has appointment at the end of the month. Patient denies any fevers, nausea, vomiting, diarrhea, constipation.  Past Medical History  Diagnosis Date  . Hernia     Bilateral Inguinal  . Cervical stenosis of spinal canal    Past Surgical History  Procedure Laterality Date  . Inguinal hernia repair    . Thumb surgery     Family History  Problem Relation Age of Onset  . Hypertension Mother   . Heart disease Father   . Crohn's disease Maternal Aunt   . Colon cancer Paternal Grandfather    History  Substance Use Topics  . Smoking status: Current Every Day Smoker -- 1.00 packs/day for 25 years  . Smokeless tobacco: Never Used  . Alcohol Use: No    Review of Systems  Constitutional: Negative.  Negative for fever.  Respiratory: Negative.  Negative for chest tightness and shortness of breath.   Cardiovascular: Negative.  Negative for chest pain.  Gastrointestinal: Positive for abdominal pain. Negative for nausea,  vomiting, diarrhea and constipation.  Genitourinary: Negative.  Negative for dysuria.  Musculoskeletal: Negative for back pain.  Skin: Negative for rash.  Neurological: Negative for headaches.  All other systems reviewed and are negative.      Allergies  Iohexol  Home Medications   Current Outpatient Rx  Name  Route  Sig  Dispense  Refill  . oxyCODONE-acetaminophen (PERCOCET) 10-325 MG per tablet   Oral   Take 1 tablet by mouth every 6 (six) hours as needed for pain.         Marland Kitchen ALPRAZolam (XANAX) 0.5 MG tablet   Oral   Take 1 tablet (0.5 mg total) by mouth 3 (three) times daily as needed. For anxiety   45 tablet   0   . ALPRAZolam (XANAX) 0.5 MG tablet   Oral   Take 1 tablet (0.5 mg total) by mouth 3 (three) times daily.   3 tablet   0    BP 135/84  Pulse 79  Temp(Src) 97.8 F (36.6 C) (Oral)  Resp 18  SpO2 98% Physical Exam  Nursing note and vitals reviewed. Constitutional: He is oriented to person, place, and time.  Strange affect  HENT:  Head: Normocephalic and atraumatic.  Cardiovascular: Normal rate, regular rhythm and normal heart sounds.   No murmur heard. Pulmonary/Chest: Effort normal and breath sounds normal. No respiratory distress. He has no wheezes.  Abdominal: Soft. Bowel sounds are normal. There is no tenderness. There is no rebound and no guarding.  Genitourinary:  Normal penis, no evidence of hernia, no redness or erythema to the perineum or scrotum, no crepitus noted  Musculoskeletal: He exhibits no edema.  Lymphadenopathy:    He has no cervical adenopathy.  Neurological: He is alert and oriented to person, place, and time.  Skin: Skin is warm and dry.    ED Course  Procedures (including critical care time) Labs Review Labs Reviewed  CBC WITH DIFFERENTIAL - Abnormal; Notable for the following:    WBC 11.2 (*)    Neutro Abs 8.2 (*)    All other components within normal limits  BASIC METABOLIC PANEL - Abnormal; Notable for the  following:    BUN 4 (*)    All other components within normal limits  URINALYSIS, ROUTINE W REFLEX MICROSCOPIC - Abnormal; Notable for the following:    Specific Gravity, Urine 1.003 (*)    All other components within normal limits  I-STAT CG4 LACTIC ACID, ED   Imaging Review Dg Abd 1 View  05/26/2013   CLINICAL DATA:  Pain  EXAM: ABDOMEN - 1 VIEW  COMPARISON:  CT abdomen and pelvis March 01, 2013  FINDINGS: There is moderate stool throughout the colon. The bowel gas pattern is normal. No obstruction or free air is seen on this supine examination. There are small phleboliths in the pelvis. There is a small bone island superior to the right acetabulum.  IMPRESSION: Bowel gas pattern unremarkable.  Moderate stool throughout colon.   Electronically Signed   By: Bretta BangWilliam  Woodruff M.D.   On: 05/26/2013 14:03     EKG Interpretation None      MDM   Final diagnoses:  Chronic pain    Patient presents with left lower quadrant pain. He states that his primary physician would not refill his medications. Have reviewed the patient's chart and it appears he has had chronic left lower quadrant pain since his hernia repair. I discussed with the patient that while we'll not be able to refill his chronic pain medications. I'm happy to treat him for any acute conditions and will evaluate him for his left lower quadrant pain. Workup is negative including lactate. Abdominal imaging is suggestive of constipation. This was shared with patient. I told him that narcotics would not help this. 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 I have offered evaluation for detox if he is interested. Patient states that "I've done that before and I don't want to do it again." Given history of withdrawal seizures, I will prescribe the patient one day of Xanax. He was referred back to his primary physician.  After history, exam, and  medical workup I feel the patient has been appropriately medically screened and is safe for discharge home. Pertinent diagnoses were discussed with the patient. Patient was given return precautions.     Shon Batonourtney F Devyn Griffing, MD 05/26/13 [phone]

## 2013-06-03 ENCOUNTER — Ambulatory Visit: Payer: Self-pay | Admitting: Pain Medicine

## 2013-06-03 DIAGNOSIS — G894 Chronic pain syndrome: Secondary | ICD-10-CM | POA: Diagnosis not present

## 2013-06-03 DIAGNOSIS — Z79899 Other long term (current) drug therapy: Secondary | ICD-10-CM | POA: Diagnosis not present

## 2013-06-03 DIAGNOSIS — N432 Other hydrocele: Secondary | ICD-10-CM | POA: Diagnosis not present

## 2013-06-03 DIAGNOSIS — M5030 Other cervical disc degeneration, unspecified cervical region: Secondary | ICD-10-CM | POA: Diagnosis not present

## 2013-06-03 DIAGNOSIS — M542 Cervicalgia: Secondary | ICD-10-CM | POA: Diagnosis not present

## 2013-06-03 DIAGNOSIS — IMO0002 Reserved for concepts with insufficient information to code with codable children: Secondary | ICD-10-CM | POA: Diagnosis not present

## 2013-06-03 DIAGNOSIS — M25569 Pain in unspecified knee: Secondary | ICD-10-CM | POA: Diagnosis not present

## 2013-06-03 DIAGNOSIS — IMO0001 Reserved for inherently not codable concepts without codable children: Secondary | ICD-10-CM | POA: Diagnosis not present

## 2013-06-03 DIAGNOSIS — M4712 Other spondylosis with myelopathy, cervical region: Secondary | ICD-10-CM | POA: Diagnosis not present

## 2013-06-03 DIAGNOSIS — G8929 Other chronic pain: Secondary | ICD-10-CM | POA: Diagnosis not present

## 2013-06-03 DIAGNOSIS — Z5181 Encounter for therapeutic drug level monitoring: Secondary | ICD-10-CM | POA: Diagnosis not present

## 2013-06-07 ENCOUNTER — Encounter (INDEPENDENT_AMBULATORY_CARE_PROVIDER_SITE_OTHER): Payer: Medicare Other | Admitting: Surgery

## 2013-06-08 ENCOUNTER — Emergency Department (HOSPITAL_COMMUNITY): Payer: Medicare Other

## 2013-06-08 ENCOUNTER — Encounter (HOSPITAL_COMMUNITY): Payer: Self-pay | Admitting: Emergency Medicine

## 2013-06-08 ENCOUNTER — Emergency Department (HOSPITAL_COMMUNITY)
Admission: EM | Admit: 2013-06-08 | Discharge: 2013-06-08 | Disposition: A | Discharge status: Home / Self Care | Payer: Medicare Other | Attending: Emergency Medicine | Admitting: Emergency Medicine

## 2013-06-08 DIAGNOSIS — Z79899 Other long term (current) drug therapy: Secondary | ICD-10-CM | POA: Diagnosis not present

## 2013-06-08 DIAGNOSIS — Z8739 Personal history of other diseases of the musculoskeletal system and connective tissue: Secondary | ICD-10-CM | POA: Insufficient documentation

## 2013-06-08 DIAGNOSIS — G8929 Other chronic pain: Secondary | ICD-10-CM | POA: Diagnosis not present

## 2013-06-08 DIAGNOSIS — F17200 Nicotine dependence, unspecified, uncomplicated: Secondary | ICD-10-CM | POA: Diagnosis not present

## 2013-06-08 DIAGNOSIS — R109 Unspecified abdominal pain: Secondary | ICD-10-CM | POA: Diagnosis not present

## 2013-06-08 DIAGNOSIS — R300 Dysuria: Secondary | ICD-10-CM | POA: Diagnosis not present

## 2013-06-08 DIAGNOSIS — Z8719 Personal history of other diseases of the digestive system: Secondary | ICD-10-CM | POA: Insufficient documentation

## 2013-06-08 DIAGNOSIS — R079 Chest pain, unspecified: Secondary | ICD-10-CM | POA: Diagnosis not present

## 2013-06-08 DIAGNOSIS — N433 Hydrocele, unspecified: Secondary | ICD-10-CM | POA: Diagnosis not present

## 2013-06-08 DIAGNOSIS — I861 Scrotal varices: Secondary | ICD-10-CM | POA: Diagnosis not present

## 2013-06-08 LAB — COMPREHENSIVE METABOLIC PANEL
ALT: 24 U/L (ref 0–53)
AST: 21 U/L (ref 0–37)
Albumin: 4.4 g/dL (ref 3.5–5.2)
Alkaline Phosphatase: 128 U/L — ABNORMAL HIGH (ref 39–117)
BUN: 9 mg/dL (ref 6–23)
CHLORIDE: 98 meq/L (ref 96–112)
CO2: 23 meq/L (ref 19–32)
CREATININE: 1.01 mg/dL (ref 0.50–1.35)
Calcium: 9.9 mg/dL (ref 8.4–10.5)
GFR calc Af Amer: >90 mL/min (ref >90)
Glucose, Bld: 102 mg/dL — ABNORMAL HIGH (ref 70–99)
Potassium: 3.7 mEq/L (ref 3.7–5.3)
Sodium: 137 mEq/L (ref 137–147)
Total Bilirubin: 0.5 mg/dL (ref 0.3–1.2)
Total Protein: 8.1 g/dL (ref 6.0–8.3)

## 2013-06-08 LAB — URINE MICROSCOPIC-ADD ON

## 2013-06-08 LAB — CBC WITH DIFFERENTIAL/PLATELET
BASOS ABS: 0 10*3/uL (ref 0.0–0.1)
Basophils Relative: 0 % (ref 0–1)
EOS PCT: 1 % (ref 0–5)
Eosinophils Absolute: 0.1 10*3/uL (ref 0.0–0.7)
HCT: 47.9 % (ref 39.0–52.0)
Hemoglobin: 17.1 g/dL — ABNORMAL HIGH (ref 13.0–17.0)
LYMPHS PCT: 26 % (ref 12–46)
Lymphs Abs: 2.9 10*3/uL (ref 0.7–4.0)
MCH: 32.2 pg (ref 26.0–34.0)
MCHC: 35.7 g/dL (ref 30.0–36.0)
MCV: 90.2 fL (ref 78.0–100.0)
Monocytes Absolute: 0.8 10*3/uL (ref 0.1–1.0)
Monocytes Relative: 7 % (ref 3–12)
NEUTROS ABS: 7.5 10*3/uL (ref 1.7–7.7)
Neutrophils Relative %: 66 % (ref 43–77)
PLATELETS: 272 10*3/uL (ref 150–400)
RBC: 5.31 MIL/uL (ref 4.22–5.81)
RDW: 13.3 % (ref 11.5–15.5)
WBC: 11.4 10*3/uL — AB (ref 4.0–10.5)

## 2013-06-08 LAB — URINALYSIS, ROUTINE W REFLEX MICROSCOPIC
Glucose, UA: NEGATIVE mg/dL
Hgb urine dipstick: NEGATIVE
KETONES UR: NEGATIVE mg/dL
LEUKOCYTES UA: NEGATIVE
NITRITE: NEGATIVE
PH: 5.5 (ref 5.0–8.0)
Protein, ur: 100 mg/dL — AB
Specific Gravity, Urine: 1.031 — ABNORMAL HIGH (ref 1.005–1.030)
Urobilinogen, UA: 1 mg/dL (ref 0.0–1.0)

## 2013-06-08 LAB — LIPASE, BLOOD: LIPASE: 21 U/L (ref 11–59)

## 2013-06-08 MED ORDER — ONDANSETRON 4 MG PO TBDP
4.0000 mg | ORAL_TABLET | Freq: Once | ORAL | Status: AC
  Administered 2013-06-08: 4 mg via ORAL
  Filled 2013-06-08: qty 1

## 2013-06-08 MED ORDER — ONDANSETRON 4 MG PO TBDP: 4.0000 mg | ORAL_TABLET | Freq: Three times a day (TID) | ORAL | Status: DC | PRN

## 2013-06-08 MED ORDER — ASPIRIN 81 MG PO CHEW
324.0000 mg | CHEWABLE_TABLET | Freq: Once | ORAL | Status: AC
  Administered 2013-06-08: 324 mg via ORAL
  Filled 2013-06-08: qty 4

## 2013-06-08 NOTE — Discharge Instructions (Signed)
Today's workup without any acute findings in the abdomen. Symptoms seem to be consistent with past chronic abdominal pain. Followup with your primary care provider and/or [place] surgery is needed. Antinausea medicine provided.

## 2013-06-08 NOTE — ED Notes (Signed)
Made EDP Zackowski aware of pt request for pain meds. No new orders given at this time.

## 2013-06-08 NOTE — ED Notes (Signed)
I notified nurse that patient wanted his IV out because he wanted to leave. IV was taken out after wards.

## 2013-06-08 NOTE — ED Provider Notes (Signed)
CSN: [id number]     Arrival date & time 06/08/13  0930 History   First MD Initiated Contact with Patient 06/08/13 [phone]     Chief Complaint  Patient presents with  . Testicle Pain     (Consider location/radiation/quality/duration/timing/severity/associated sxs/prior Treatment) Patient is a 38 y.o. male presenting with testicular pain. The history is provided by the patient.  Testicle Pain Pertinent negatives include no chest pain, no abdominal pain, no headaches and no shortness of breath.   patient presenting with the complaint of lower Donald pain. Bulging his right inguinal area pain in both testicles plana testicles the writing of high the scrotal sac. Swelling in that area. And pain with urination. Patient status post bilateral inguinal hernia repairs and since that time has had the persistent pain in the area, similar to this. Patient has a known history of chronic pain. Currently pain is 8/10. Both in the groin and the lower abdomen area.  Past Medical History  Diagnosis Date  . Hernia     Bilateral Inguinal  . Cervical stenosis of spinal canal    Past Surgical History  Procedure Laterality Date  . Inguinal hernia repair    . Thumb surgery     Family History  Problem Relation Age of Onset  . Hypertension Mother   . Heart disease Father   . Crohn's disease Maternal Aunt   . Colon cancer Paternal Grandfather    History  Substance Use Topics  . Smoking status: Current Every Day Smoker -- 1.00 packs/day for 25 years  . Smokeless tobacco: Never Used  . Alcohol Use: No    Review of Systems  Constitutional: Negative for fever.  HENT: Negative for congestion.   Eyes: Negative for redness.  Respiratory: Negative for shortness of breath.   Cardiovascular: Negative for chest pain.  Gastrointestinal: Negative for abdominal pain.  Genitourinary: Positive for dysuria and testicular pain.  Musculoskeletal: Negative for back pain.  Skin: Negative for rash.  Neurological:  Negative for headaches.  Hematological: Does not bruise/bleed easily.  Psychiatric/Behavioral: Negative for confusion.      Allergies  Iohexol  Home Medications   Current Outpatient Rx  Name  Route  Sig  Dispense  Refill  . ALPRAZolam (XANAX) 0.5 MG tablet   Oral   Take 1 tablet (0.5 mg total) by mouth 3 (three) times daily.   3 tablet   0   . oxyCODONE-acetaminophen (PERCOCET) 10-325 MG per tablet   Oral   Take 1 tablet by mouth every 6 (six) hours as needed for pain.         Marland Kitchen ondansetron (ZOFRAN ODT) 4 MG disintegrating tablet   Oral   Take 1 tablet (4 mg total) by mouth every 8 (eight) hours as needed for nausea or vomiting.   20 tablet   0    BP 135/83  Pulse 75  Temp(Src) 98.4 F (36.9 C)  Resp 19  SpO2 99% Physical Exam  Nursing note and vitals reviewed. Constitutional: He is oriented to person, place, and time. He appears well-developed and well-nourished. No distress.  HENT:  Head: Normocephalic and atraumatic.  Mouth/Throat: Oropharynx is clear and moist.  Eyes: Conjunctivae and EOM are normal. Pupils are equal, round, and reactive to light.  Neck: Normal range of motion.  Cardiovascular: Normal rate, regular rhythm and normal heart sounds.   No murmur heard. Pulmonary/Chest: Effort normal and breath sounds normal. No respiratory distress.  Abdominal: Soft. Bowel sounds are normal. He exhibits no mass. There is  no tenderness.  Genitourinary: Penis normal.  Normal penis no discharge testicles nontender no mass. No groin adenopathy no evidence of hernia on exam. No epididymal tenderness.  Musculoskeletal: Normal range of motion. He exhibits no edema.  Neurological: He is alert and oriented to person, place, and time. No cranial nerve deficit. He exhibits normal muscle tone. Coordination normal.  Skin: Skin is warm. No rash noted.    ED Course  Procedures (including critical care time) Labs Review Labs Reviewed  COMPREHENSIVE METABOLIC PANEL -  Abnormal; Notable for the following:    Glucose, Bld 102 (*)    Alkaline Phosphatase 128 (*)    All other components within normal limits  CBC WITH DIFFERENTIAL - Abnormal; Notable for the following:    WBC 11.4 (*)    Hemoglobin 17.1 (*)    All other components within normal limits  URINALYSIS, ROUTINE W REFLEX MICROSCOPIC - Abnormal; Notable for the following:    Color, Urine AMBER (*)    APPearance CLOUDY (*)    Specific Gravity, Urine 1.031 (*)    Bilirubin Urine SMALL (*)    Protein, ur 100 (*)    All other components within normal limits  URINE MICROSCOPIC-ADD ON - Abnormal; Notable for the following:    Bacteria, UA MANY (*)    Casts HYALINE CASTS (*)    All other components within normal limits  LIPASE, BLOOD   Results for orders placed during the hospital encounter of 06/08/13  COMPREHENSIVE METABOLIC PANEL      Result Value Ref Range   Sodium 137  137 - 147 mEq/L   Potassium 3.7  3.7 - 5.3 mEq/L   Chloride 98  96 - 112 mEq/L   CO2 23  19 - 32 mEq/L   Glucose, Bld 102 (*) 70 - 99 mg/dL   BUN 9  6 - 23 mg/dL   Creatinine, Ser 6.96  0.50 - 1.35 mg/dL   Calcium 9.9  8.4 - 29.5 mg/dL   Total Protein 8.1  6.0 - 8.3 g/dL   Albumin 4.4  3.5 - 5.2 g/dL   AST 21  0 - 37 U/L   ALT 24  0 - 53 U/L   Alkaline Phosphatase 128 (*) 39 - 117 U/L   Total Bilirubin 0.5  0.3 - 1.2 mg/dL   GFR calc non Af Amer >90  >90 mL/min   GFR calc Af Amer >90  >90 mL/min  LIPASE, BLOOD      Result Value Ref Range   Lipase 21  11 - 59 U/L  CBC WITH DIFFERENTIAL      Result Value Ref Range   WBC 11.4 (*) 4.0 - 10.5 K/uL   RBC 5.31  4.22 - 5.81 MIL/uL   Hemoglobin 17.1 (*) 13.0 - 17.0 g/dL   HCT 28.4  13.2 - 44.0 %   MCV 90.2  78.0 - 100.0 fL   MCH 32.2  26.0 - 34.0 pg   MCHC 35.7  30.0 - 36.0 g/dL   RDW 10.2  72.5 - 36.6 %   Platelets 272  150 - 400 K/uL   Neutrophils Relative % 66  43 - 77 %   Neutro Abs 7.5  1.7 - 7.7 K/uL   Lymphocytes Relative 26  12 - 46 %   Lymphs Abs 2.9  0.7 -  4.0 K/uL   Monocytes Relative 7  3 - 12 %   Monocytes Absolute 0.8  0.1 - 1.0 K/uL   Eosinophils Relative 1  0 - 5 %   Eosinophils Absolute 0.1  0.0 - 0.7 K/uL   Basophils Relative 0  0 - 1 %   Basophils Absolute 0.0  0.0 - 0.1 K/uL  URINALYSIS, ROUTINE W REFLEX MICROSCOPIC      Result Value Ref Range   Color, Urine AMBER (*) YELLOW   APPearance CLOUDY (*) CLEAR   Specific Gravity, Urine 1.031 (*) 1.005 - 1.030   pH 5.5  5.0 - 8.0   Glucose, UA NEGATIVE  NEGATIVE mg/dL   Hgb urine dipstick NEGATIVE  NEGATIVE   Bilirubin Urine SMALL (*) NEGATIVE   Ketones, ur NEGATIVE  NEGATIVE mg/dL   Protein, ur 161100 (*) NEGATIVE mg/dL   Urobilinogen, UA 1.0  0.0 - 1.0 mg/dL   Nitrite NEGATIVE  NEGATIVE   Leukocytes, UA NEGATIVE  NEGATIVE  URINE MICROSCOPIC-ADD ON      Result Value Ref Range   Squamous Epithelial / LPF RARE  RARE   WBC, UA 3-6  <3 WBC/hpf   RBC / HPF 0-2  <3 RBC/hpf   Bacteria, UA MANY (*) RARE   Casts HYALINE CASTS (*) NEGATIVE   Urine-Other MUCOUS PRESENT      Imaging Review Ct Abdomen Pelvis Wo Contrast  06/08/2013   CLINICAL DATA:  Lower abdominal pain extending to testicular region. Nausea. Diarrhea. Difficulty urinating for the past week.  EXAM: CT ABDOMEN AND PELVIS WITHOUT CONTRAST  TECHNIQUE: Multidetector CT imaging of the abdomen and pelvis was performed following the standard protocol without intravenous contrast.  COMPARISON:  03/01/2013 CT and 03/02/2013 MR.  FINDINGS: No renal or ureteral calculi or findings to suggest renal collecting system obstruction.  No extra luminal bowel inflammatory process, free fluid or free air.  Within the limitation of non contrast imaging, no worrisome hepatic, splenic, pancreatic, renal or adrenal lesion. No calcified gallstones.  Tiny calcification right common iliac artery. No abdominal aortic aneurysm. No adenopathy.  No bony destructive lesion.  Partially decompressed urinary bladder without gross abnormality.  No bowel containing  hernia.  No worrisome lung base abnormality.  IMPRESSION: No cause of patient's discomfort identified as detailed above.  Previously noted pancreatic abnormality not appreciated on present noncontrast examination.   Electronically Signed   By: Bridgett LarssonSteve  Olson M.D.   On: 06/08/2013 11:56   Dg Chest 2 View  06/08/2013   CLINICAL DATA:  Chest pain and testicle pain for 10 days, smoking history  EXAM: CHEST  2 VIEW  COMPARISON:  CT ABD/PELV WO CM dated 06/08/2013; DG CHEST 2 VIEW dated 05/17/2013  FINDINGS: The heart size and mediastinal contours are within normal limits. Both lungs are clear. The visualized skeletal structures are unremarkable.  IMPRESSION: No active cardiopulmonary disease.   Electronically Signed   By: Esperanza Heiraymond  Rubner M.D.   On: 06/08/2013 13:11   [place] Scrotum  06/08/2013   CLINICAL DATA:  Testicular pain.  EXAM: SCROTAL ULTRASOUND  DOPPLER ULTRASOUND OF THE TESTICLES  TECHNIQUE: Complete ultrasound examination of the testicles, epididymis, and other scrotal structures was performed. Color and spectral Doppler ultrasound were also utilized to evaluate blood flow to the testicles.  COMPARISON:  None.  FINDINGS: Right testicle  Measurements: 4.1 x 2.4 x 3.1 cm. No mass or microlithiasis visualized.  Left testicle  Measurements: 3.9 x 2.2 x 3.9 cm. No mass or microlithiasis visualized.  Right epididymis:  Normal in size and appearance.  Left epididymis:  Normal in size and appearance.  Hydrocele:  Tiny bilateral hydroceles.  Varicocele:  Small left varicocele.  Pulsed Doppler interrogation of both testes demonstrates low resistance arterial and venous waveforms bilaterally.  IMPRESSION: Normal appearing testicles with normal perfusion. Small left varicocele. Tiny hydroceles.   Electronically Signed   By: Geanie Cooley M.D.   On: 06/08/2013 11:57   [place] Art/ven Flow Abd Pelv Doppler  06/08/2013   CLINICAL DATA:  Testicular pain.  EXAM: SCROTAL ULTRASOUND  DOPPLER ULTRASOUND OF THE TESTICLES  TECHNIQUE:  Complete ultrasound examination of the testicles, epididymis, and other scrotal structures was performed. Color and spectral Doppler ultrasound were also utilized to evaluate blood flow to the testicles.  COMPARISON:  None.  FINDINGS: Right testicle  Measurements: 4.1 x 2.4 x 3.1 cm. No mass or microlithiasis visualized.  Left testicle  Measurements: 3.9 x 2.2 x 3.9 cm. No mass or microlithiasis visualized.  Right epididymis:  Normal in size and appearance.  Left epididymis:  Normal in size and appearance.  Hydrocele:  Tiny bilateral hydroceles.  Varicocele:  Small left varicocele.  Pulsed Doppler interrogation of both testes demonstrates low resistance arterial and venous waveforms bilaterally.  IMPRESSION: Normal appearing testicles with normal perfusion. Small left varicocele. Tiny hydroceles.   Electronically Signed   By: Geanie Cooley M.D.   On: 06/08/2013 11:57     EKG Interpretation   Date/Time:  Tuesday June 08 2013 12:53:17 EDT Ventricular Rate:  78 PR Interval:  164 QRS Duration: 118 QT Interval:  374 QTC Calculation: 426 R Axis:   20 Text Interpretation:  Sinus rhythm Incomplete right bundle branch block  Consider inferior infarct No significant change since No significant  change since last tracing inferiorly Confirmed by Natahlia Hoggard  MD, Brecken Walth  [phone]) on 06/08/2013 1:19:17 PM      MDM   Final diagnoses:  Chronic abdominal pain    Patient with extensive workup for the testicular pain and the lower corner abdominal pain without any acute findings. Review of past charts this is suggestive of his of his chronic abdominal pain following hernia repair. As noted suggest that social, surgery may not have a solution for this. We'll not treat chronic pain here. Patient treated with some antinausea medicine. After the patient had been here for well he develops chest pain that is now resolved the chest x-ray is negative EKG without any acute changes. No evidence of acute cardiac event.  Opaque discharge patient home with followup with his primary care provider.    Shelda Jakes, MD 06/08/13 [phone]

## 2013-06-08 NOTE — ED Notes (Signed)
I will check on patient in a few to see if he urinates

## 2013-06-08 NOTE — ED Notes (Signed)
Pt requesting again for pain meds.  Made Zackowski aware. No new orders given at this time.

## 2013-06-08 NOTE — ED Notes (Signed)
Patient transported to X-ray 

## 2013-06-08 NOTE — ED Notes (Signed)
Pt states testicles are coming up inside of him; states has history of same with surgery; c/o bulge in right groin area with pain radiating around to his buttocks; pain x 2 wks; feels numbness in right side of groin and testicle; c/o nausea; vomiting black substance

## 2013-06-08 NOTE — ED Notes (Signed)
Patient will call once he urinates 

## 2013-06-30 DIAGNOSIS — F4542 Pain disorder with related psychological factors: Secondary | ICD-10-CM | POA: Diagnosis not present

## 2013-07-20 ENCOUNTER — Ambulatory Visit: Payer: Self-pay | Admitting: Pain Medicine

## 2013-07-20 DIAGNOSIS — M542 Cervicalgia: Secondary | ICD-10-CM | POA: Diagnosis not present

## 2013-07-20 DIAGNOSIS — R209 Unspecified disturbances of skin sensation: Secondary | ICD-10-CM | POA: Diagnosis not present

## 2013-07-20 DIAGNOSIS — G894 Chronic pain syndrome: Secondary | ICD-10-CM | POA: Diagnosis not present

## 2013-07-20 DIAGNOSIS — M545 Low back pain, unspecified: Secondary | ICD-10-CM | POA: Diagnosis not present

## 2013-07-20 DIAGNOSIS — IMO0002 Reserved for concepts with insufficient information to code with codable children: Secondary | ICD-10-CM | POA: Diagnosis not present

## 2013-07-20 LAB — BASIC METABOLIC PANEL
Anion Gap: 8 (ref 7–16)
BUN: 4 mg/dL — AB (ref 7–18)
CALCIUM: 8.7 mg/dL (ref 8.5–10.1)
Chloride: 107 mmol/L (ref 98–107)
Co2: 26 mmol/L (ref 21–32)
Creatinine: 0.81 mg/dL (ref 0.60–1.30)
GLUCOSE: 93 mg/dL (ref 65–99)
Osmolality: 278 (ref 275–301)
Potassium: 3.8 mmol/L (ref 3.5–5.1)
Sodium: 141 mmol/L (ref 136–145)

## 2013-07-20 LAB — HEPATIC FUNCTION PANEL A (ARMC)
Albumin: 3.8 g/dL (ref 3.4–5.0)
Alkaline Phosphatase: 110 U/L
Bilirubin, Direct: 0.1 mg/dL (ref 0.00–0.20)
Bilirubin,Total: 0.4 mg/dL (ref 0.2–1.0)
SGOT(AST): 17 U/L (ref 15–37)
SGPT (ALT): 17 U/L (ref 12–78)
Total Protein: 7.7 g/dL (ref 6.4–8.2)

## 2013-07-20 LAB — MAGNESIUM: Magnesium: 1.6 mg/dL — ABNORMAL LOW

## 2013-07-20 LAB — SEDIMENTATION RATE: Erythrocyte Sed Rate: 1 mm/hr (ref 0–15)

## 2013-07-26 ENCOUNTER — Ambulatory Visit: Payer: Self-pay | Admitting: Pain Medicine

## 2013-07-26 DIAGNOSIS — IMO0002 Reserved for concepts with insufficient information to code with codable children: Secondary | ICD-10-CM | POA: Diagnosis not present

## 2013-07-26 DIAGNOSIS — K5900 Constipation, unspecified: Secondary | ICD-10-CM | POA: Diagnosis not present

## 2013-07-26 DIAGNOSIS — Z79899 Other long term (current) drug therapy: Secondary | ICD-10-CM | POA: Diagnosis not present

## 2013-07-26 DIAGNOSIS — J438 Other emphysema: Secondary | ICD-10-CM | POA: Diagnosis not present

## 2013-07-26 DIAGNOSIS — M19019 Primary osteoarthritis, unspecified shoulder: Secondary | ICD-10-CM | POA: Diagnosis not present

## 2013-07-26 DIAGNOSIS — Z5181 Encounter for therapeutic drug level monitoring: Secondary | ICD-10-CM | POA: Diagnosis not present

## 2013-07-26 DIAGNOSIS — M25569 Pain in unspecified knee: Secondary | ICD-10-CM | POA: Diagnosis not present

## 2013-07-26 DIAGNOSIS — G894 Chronic pain syndrome: Secondary | ICD-10-CM | POA: Diagnosis not present

## 2013-07-26 DIAGNOSIS — G4700 Insomnia, unspecified: Secondary | ICD-10-CM | POA: Diagnosis not present

## 2013-07-26 DIAGNOSIS — G8929 Other chronic pain: Secondary | ICD-10-CM | POA: Diagnosis not present

## 2013-07-26 DIAGNOSIS — F411 Generalized anxiety disorder: Secondary | ICD-10-CM | POA: Diagnosis not present

## 2013-07-26 DIAGNOSIS — M4712 Other spondylosis with myelopathy, cervical region: Secondary | ICD-10-CM | POA: Diagnosis not present

## 2013-07-26 DIAGNOSIS — F329 Major depressive disorder, single episode, unspecified: Secondary | ICD-10-CM | POA: Diagnosis not present

## 2013-07-26 DIAGNOSIS — IMO0001 Reserved for inherently not codable concepts without codable children: Secondary | ICD-10-CM | POA: Diagnosis not present

## 2013-07-26 DIAGNOSIS — G40909 Epilepsy, unspecified, not intractable, without status epilepticus: Secondary | ICD-10-CM | POA: Diagnosis not present

## 2013-07-26 DIAGNOSIS — F3289 Other specified depressive episodes: Secondary | ICD-10-CM | POA: Diagnosis not present

## 2013-07-26 DIAGNOSIS — M5030 Other cervical disc degeneration, unspecified cervical region: Secondary | ICD-10-CM | POA: Diagnosis not present

## 2013-07-26 DIAGNOSIS — F410 Panic disorder [episodic paroxysmal anxiety] without agoraphobia: Secondary | ICD-10-CM | POA: Diagnosis not present

## 2013-07-26 DIAGNOSIS — M542 Cervicalgia: Secondary | ICD-10-CM | POA: Diagnosis not present

## 2013-07-26 DIAGNOSIS — M4120 Other idiopathic scoliosis, site unspecified: Secondary | ICD-10-CM | POA: Diagnosis not present

## 2013-07-26 DIAGNOSIS — K219 Gastro-esophageal reflux disease without esophagitis: Secondary | ICD-10-CM | POA: Diagnosis not present

## 2013-07-26 DIAGNOSIS — F17200 Nicotine dependence, unspecified, uncomplicated: Secondary | ICD-10-CM | POA: Diagnosis not present

## 2013-07-26 DIAGNOSIS — N433 Hydrocele, unspecified: Secondary | ICD-10-CM | POA: Diagnosis not present

## 2013-08-03 ENCOUNTER — Ambulatory Visit: Payer: Self-pay | Admitting: Pain Medicine

## 2013-08-03 ENCOUNTER — Telehealth: Payer: Self-pay | Admitting: *Deleted

## 2013-08-03 DIAGNOSIS — R51 Headache: Secondary | ICD-10-CM | POA: Diagnosis not present

## 2013-08-03 DIAGNOSIS — M531 Cervicobrachial syndrome: Secondary | ICD-10-CM | POA: Diagnosis not present

## 2013-08-03 NOTE — Telephone Encounter (Signed)
We will not be able to do that

## 2013-08-03 NOTE — Telephone Encounter (Signed)
Caller name:  Caron Relation to pt:  self Call back number:  [phone] Pharmacy:  Reason for call:  Pt called and said he made a mistake.  He wants to know if you will be willing to see him again and help him.  bw

## 2013-08-03 NOTE — Telephone Encounter (Signed)
Patient was dismissed 05/08/09 for non compliance with pain medications.  Please advise      KP

## 2013-08-05 NOTE — Telephone Encounter (Signed)
Called patient and advised once dismissed, it is not our practice to re-establish care.  Pt stated he understood.

## 2013-08-25 ENCOUNTER — Ambulatory Visit: Payer: Self-pay | Admitting: Pain Medicine

## 2013-08-25 DIAGNOSIS — Z79899 Other long term (current) drug therapy: Secondary | ICD-10-CM | POA: Diagnosis not present

## 2013-08-25 DIAGNOSIS — M25569 Pain in unspecified knee: Secondary | ICD-10-CM | POA: Diagnosis not present

## 2013-08-25 DIAGNOSIS — IMO0002 Reserved for concepts with insufficient information to code with codable children: Secondary | ICD-10-CM | POA: Diagnosis not present

## 2013-08-25 DIAGNOSIS — G894 Chronic pain syndrome: Secondary | ICD-10-CM | POA: Diagnosis not present

## 2013-08-25 DIAGNOSIS — M542 Cervicalgia: Secondary | ICD-10-CM | POA: Diagnosis not present

## 2013-10-01 ENCOUNTER — Ambulatory Visit: Payer: Self-pay | Admitting: Pain Medicine

## 2013-10-01 DIAGNOSIS — F17200 Nicotine dependence, unspecified, uncomplicated: Secondary | ICD-10-CM | POA: Diagnosis not present

## 2013-10-01 DIAGNOSIS — J438 Other emphysema: Secondary | ICD-10-CM | POA: Diagnosis not present

## 2013-10-01 DIAGNOSIS — K5900 Constipation, unspecified: Secondary | ICD-10-CM | POA: Diagnosis not present

## 2013-10-01 DIAGNOSIS — IMO0002 Reserved for concepts with insufficient information to code with codable children: Secondary | ICD-10-CM | POA: Diagnosis not present

## 2013-10-01 DIAGNOSIS — G894 Chronic pain syndrome: Secondary | ICD-10-CM | POA: Diagnosis not present

## 2013-10-01 DIAGNOSIS — IMO0001 Reserved for inherently not codable concepts without codable children: Secondary | ICD-10-CM | POA: Diagnosis not present

## 2013-10-01 DIAGNOSIS — M4712 Other spondylosis with myelopathy, cervical region: Secondary | ICD-10-CM | POA: Diagnosis not present

## 2013-10-01 DIAGNOSIS — M5030 Other cervical disc degeneration, unspecified cervical region: Secondary | ICD-10-CM | POA: Diagnosis not present

## 2013-10-01 DIAGNOSIS — Z79899 Other long term (current) drug therapy: Secondary | ICD-10-CM | POA: Diagnosis not present

## 2013-10-01 DIAGNOSIS — F410 Panic disorder [episodic paroxysmal anxiety] without agoraphobia: Secondary | ICD-10-CM | POA: Diagnosis not present

## 2013-10-01 DIAGNOSIS — F3289 Other specified depressive episodes: Secondary | ICD-10-CM | POA: Diagnosis not present

## 2013-10-01 DIAGNOSIS — F411 Generalized anxiety disorder: Secondary | ICD-10-CM | POA: Diagnosis not present

## 2013-10-01 DIAGNOSIS — K219 Gastro-esophageal reflux disease without esophagitis: Secondary | ICD-10-CM | POA: Diagnosis not present

## 2013-10-01 DIAGNOSIS — G40909 Epilepsy, unspecified, not intractable, without status epilepticus: Secondary | ICD-10-CM | POA: Diagnosis not present

## 2013-10-01 DIAGNOSIS — F329 Major depressive disorder, single episode, unspecified: Secondary | ICD-10-CM | POA: Diagnosis not present

## 2013-10-01 DIAGNOSIS — G4700 Insomnia, unspecified: Secondary | ICD-10-CM | POA: Diagnosis not present

## 2013-10-01 DIAGNOSIS — M542 Cervicalgia: Secondary | ICD-10-CM | POA: Diagnosis not present

## 2013-10-01 DIAGNOSIS — G8929 Other chronic pain: Secondary | ICD-10-CM | POA: Diagnosis not present

## 2013-10-01 DIAGNOSIS — M4120 Other idiopathic scoliosis, site unspecified: Secondary | ICD-10-CM | POA: Diagnosis not present

## 2013-10-01 DIAGNOSIS — M25569 Pain in unspecified knee: Secondary | ICD-10-CM | POA: Diagnosis not present

## 2013-10-01 DIAGNOSIS — M19019 Primary osteoarthritis, unspecified shoulder: Secondary | ICD-10-CM | POA: Diagnosis not present

## 2013-10-01 DIAGNOSIS — N433 Hydrocele, unspecified: Secondary | ICD-10-CM | POA: Diagnosis not present

## 2013-10-27 ENCOUNTER — Ambulatory Visit: Payer: Self-pay | Admitting: Pain Medicine

## 2013-10-27 DIAGNOSIS — IMO0002 Reserved for concepts with insufficient information to code with codable children: Secondary | ICD-10-CM | POA: Diagnosis not present

## 2013-10-27 DIAGNOSIS — G894 Chronic pain syndrome: Secondary | ICD-10-CM | POA: Diagnosis not present

## 2013-10-27 DIAGNOSIS — F17200 Nicotine dependence, unspecified, uncomplicated: Secondary | ICD-10-CM | POA: Diagnosis not present

## 2013-10-27 DIAGNOSIS — G8929 Other chronic pain: Secondary | ICD-10-CM | POA: Diagnosis not present

## 2013-10-27 DIAGNOSIS — M25569 Pain in unspecified knee: Secondary | ICD-10-CM | POA: Diagnosis not present

## 2013-10-27 DIAGNOSIS — M4120 Other idiopathic scoliosis, site unspecified: Secondary | ICD-10-CM | POA: Diagnosis not present

## 2013-10-27 DIAGNOSIS — K5900 Constipation, unspecified: Secondary | ICD-10-CM | POA: Diagnosis not present

## 2013-10-27 DIAGNOSIS — F411 Generalized anxiety disorder: Secondary | ICD-10-CM | POA: Diagnosis not present

## 2013-10-27 DIAGNOSIS — Z5181 Encounter for therapeutic drug level monitoring: Secondary | ICD-10-CM | POA: Diagnosis not present

## 2013-10-27 DIAGNOSIS — M542 Cervicalgia: Secondary | ICD-10-CM | POA: Diagnosis not present

## 2013-10-27 DIAGNOSIS — IMO0001 Reserved for inherently not codable concepts without codable children: Secondary | ICD-10-CM | POA: Diagnosis not present

## 2013-10-27 DIAGNOSIS — F329 Major depressive disorder, single episode, unspecified: Secondary | ICD-10-CM | POA: Diagnosis not present

## 2013-10-27 DIAGNOSIS — N433 Hydrocele, unspecified: Secondary | ICD-10-CM | POA: Diagnosis not present

## 2013-10-27 DIAGNOSIS — Z79899 Other long term (current) drug therapy: Secondary | ICD-10-CM | POA: Diagnosis not present

## 2013-10-27 DIAGNOSIS — G40909 Epilepsy, unspecified, not intractable, without status epilepticus: Secondary | ICD-10-CM | POA: Diagnosis not present

## 2013-10-27 DIAGNOSIS — F3289 Other specified depressive episodes: Secondary | ICD-10-CM | POA: Diagnosis not present

## 2013-10-27 DIAGNOSIS — M19019 Primary osteoarthritis, unspecified shoulder: Secondary | ICD-10-CM | POA: Diagnosis not present

## 2013-10-27 DIAGNOSIS — M5030 Other cervical disc degeneration, unspecified cervical region: Secondary | ICD-10-CM | POA: Diagnosis not present

## 2013-10-27 DIAGNOSIS — K219 Gastro-esophageal reflux disease without esophagitis: Secondary | ICD-10-CM | POA: Diagnosis not present

## 2013-10-27 DIAGNOSIS — F410 Panic disorder [episodic paroxysmal anxiety] without agoraphobia: Secondary | ICD-10-CM | POA: Diagnosis not present

## 2013-10-27 DIAGNOSIS — J438 Other emphysema: Secondary | ICD-10-CM | POA: Diagnosis not present

## 2013-10-27 DIAGNOSIS — M4712 Other spondylosis with myelopathy, cervical region: Secondary | ICD-10-CM | POA: Diagnosis not present

## 2013-10-29 ENCOUNTER — Ambulatory Visit: Payer: Self-pay | Admitting: Pain Medicine

## 2013-10-29 DIAGNOSIS — M542 Cervicalgia: Secondary | ICD-10-CM | POA: Diagnosis not present

## 2013-10-29 DIAGNOSIS — M5412 Radiculopathy, cervical region: Secondary | ICD-10-CM | POA: Diagnosis not present

## 2013-11-09 ENCOUNTER — Ambulatory Visit: Payer: Self-pay | Admitting: Pain Medicine

## 2013-11-09 DIAGNOSIS — M542 Cervicalgia: Secondary | ICD-10-CM | POA: Diagnosis not present

## 2013-11-09 DIAGNOSIS — M47812 Spondylosis without myelopathy or radiculopathy, cervical region: Secondary | ICD-10-CM | POA: Diagnosis not present

## 2013-11-09 DIAGNOSIS — M5412 Radiculopathy, cervical region: Secondary | ICD-10-CM | POA: Diagnosis not present

## 2013-11-29 ENCOUNTER — Ambulatory Visit: Payer: Self-pay | Admitting: Pain Medicine

## 2013-11-29 DIAGNOSIS — M5412 Radiculopathy, cervical region: Secondary | ICD-10-CM | POA: Diagnosis not present

## 2013-11-29 DIAGNOSIS — R51 Headache: Secondary | ICD-10-CM | POA: Diagnosis not present

## 2013-11-29 DIAGNOSIS — M25519 Pain in unspecified shoulder: Secondary | ICD-10-CM | POA: Diagnosis not present

## 2013-11-29 DIAGNOSIS — M25559 Pain in unspecified hip: Secondary | ICD-10-CM | POA: Diagnosis not present

## 2013-11-29 DIAGNOSIS — M542 Cervicalgia: Secondary | ICD-10-CM | POA: Diagnosis not present

## 2013-12-21 ENCOUNTER — Ambulatory Visit: Payer: Self-pay | Admitting: Pain Medicine

## 2013-12-21 DIAGNOSIS — M546 Pain in thoracic spine: Secondary | ICD-10-CM | POA: Diagnosis not present

## 2013-12-21 DIAGNOSIS — M542 Cervicalgia: Secondary | ICD-10-CM | POA: Diagnosis not present

## 2013-12-21 DIAGNOSIS — R51 Headache: Secondary | ICD-10-CM | POA: Diagnosis not present

## 2013-12-21 DIAGNOSIS — M792 Neuralgia and neuritis, unspecified: Secondary | ICD-10-CM | POA: Diagnosis not present

## 2013-12-21 DIAGNOSIS — M5412 Radiculopathy, cervical region: Secondary | ICD-10-CM | POA: Diagnosis not present

## 2014-01-05 ENCOUNTER — Ambulatory Visit (INDEPENDENT_AMBULATORY_CARE_PROVIDER_SITE_OTHER): Payer: Medicare Other | Admitting: Family Medicine

## 2014-01-05 ENCOUNTER — Ambulatory Visit (INDEPENDENT_AMBULATORY_CARE_PROVIDER_SITE_OTHER): Payer: Medicare Other

## 2014-01-05 VITALS — BP 110/84 | HR 79 | Temp 98.7°F | Resp 16 | Ht 70.5 in | Wt 168.8 lb

## 2014-01-05 DIAGNOSIS — J988 Other specified respiratory disorders: Secondary | ICD-10-CM

## 2014-01-05 DIAGNOSIS — M5432 Sciatica, left side: Secondary | ICD-10-CM | POA: Diagnosis not present

## 2014-01-05 DIAGNOSIS — R05 Cough: Secondary | ICD-10-CM

## 2014-01-05 DIAGNOSIS — G8929 Other chronic pain: Secondary | ICD-10-CM

## 2014-01-05 DIAGNOSIS — M25552 Pain in left hip: Secondary | ICD-10-CM

## 2014-01-05 DIAGNOSIS — R059 Cough, unspecified: Secondary | ICD-10-CM

## 2014-01-05 DIAGNOSIS — R079 Chest pain, unspecified: Secondary | ICD-10-CM

## 2014-01-05 DIAGNOSIS — J22 Unspecified acute lower respiratory infection: Secondary | ICD-10-CM

## 2014-01-05 DIAGNOSIS — M545 Low back pain: Secondary | ICD-10-CM

## 2014-01-05 DIAGNOSIS — M542 Cervicalgia: Secondary | ICD-10-CM

## 2014-01-05 LAB — POCT CBC
Granulocyte percent: 54.2 %G (ref 37–80)
HCT, POC: 46.8 % (ref 43.5–53.7)
Hemoglobin: 15.3 g/dL (ref 14.1–18.1)
LYMPH, POC: 3.5 — AB (ref 0.6–3.4)
MCH: 32.1 pg — AB (ref 27–31.2)
MCHC: 32.8 g/dL (ref 31.8–35.4)
MCV: 97.9 fL — AB (ref 80–97)
MID (cbc): 0.5 (ref 0–0.9)
MPV: 7.2 fL (ref 0–99.8)
POC Granulocyte: 4.7 (ref 2–6.9)
POC LYMPH PERCENT: 40.4 %L (ref 10–50)
POC MID %: 5.4 % (ref 0–12)
Platelet Count, POC: 271 10*3/uL (ref 142–424)
RBC: 4.78 M/uL (ref 4.69–6.13)
RDW, POC: 15.4 %
WBC: 8.6 10*3/uL (ref 4.6–10.2)

## 2014-01-05 MED ORDER — AZITHROMYCIN 250 MG PO TABS: ORAL_TABLET | ORAL | Status: DC

## 2014-01-05 MED ORDER — BENZONATATE 100 MG PO CAPS: 100.0000 mg | ORAL_CAPSULE | Freq: Three times a day (TID) | ORAL | Status: DC | PRN

## 2014-01-05 MED ORDER — MELOXICAM 7.5 MG PO TABS: 7.5000 mg | ORAL_TABLET | Freq: Every day | ORAL | Status: DC

## 2014-01-05 NOTE — Progress Notes (Addendum)
Subjective:    Patient ID: Brent Morales., male    DOB: 11/09/1975, 38 y.o.   MRN: [id number] This chart was scribed for Brent Ray, MD by Cathie Hoops, ED Scribe. The patient was seen in Room 8. The patient's care was started at 9:01 PM.   01/05/2014  Chief Complaint  Patient presents with  . Hip Pain    left hip pain x 2 months.  pain in his chest that goes into his back x 1 month.  cough that is non productive x 1 month    HPI HPI Comments: Previously a pt of [facility] in [place] but by notes was dismissed from the practice due to non-compliance. History of chronic pain per chart review. He was last seen in march in the emergency room for chronic abdominal pain and testicular pain thought to be after his hernia repair. He also noted chest pain, CXR was negative, EKG was without acute changes.  Brent Morales Brent Morales. is a 38 y.o. male who presents to the Emergency Department complaining of chronic constant, moderate, gradually worsening chest pain onset one month ago. Pt states his pain radiates to his back. He describes his pain as shooting. He last had this type of pain about 5 years ago and was due to walking pneumonia. Pt complains associated non-productive cough that began at the same time as the chest pain onset one month ago. Pt denies fevers, rhinorrhea, congestion, rash, or wounds.   Pt also complains of new, constant, moderate and gradually worsening left hip pain. Pt denies trauma or injury. Pt describes his pain as shooting. Pt's pain radiates down his left knee. Pt states his abdomen to go numb. Pt states he was in pain management until 3.5 weeks ago, he was seeing Dr. Dossie Arbour in [place]. Pt states he has been taking narcotics for about 10 years. Pt saw Dr. Dossie Arbour for his neck pain. Pt states he no longer sees Dr. Dossie Arbour. Pt states he was discharged from the practice because he states he no longer trusted him. Pt states he last had pain medication filled in  9/22 from Dr. Dossie Arbour. Pt was taking Percocet and Xanax. Pt states he has severe anxiety and takes Xanax from his friends. Pt denies he has primary care provider.   Pt denies illicit drug usage or alcohol usage.  Review of Systems  Constitutional: Negative for fever and chills.  HENT: Negative for congestion and rhinorrhea.   Respiratory: Positive for cough. Negative for shortness of breath.   Cardiovascular: Positive for chest pain.  Gastrointestinal: Positive for abdominal pain. Negative for nausea and vomiting.  Musculoskeletal: Positive for arthralgias and back pain.  Skin: Negative for rash and wound.  Neurological: Positive for numbness. Negative for weakness.   Objective:   Filed Vitals:   01/05/14 2029  BP: 110/84  Pulse: 79  Temp: 98.7 F (37.1 C)  TempSrc: Oral  Resp: 16  Height: 5' 10.5" (1.791 m)  Weight: 168 lb 12.8 oz (76.567 kg)  SpO2: 99%    Physical Exam  Vitals reviewed. Constitutional: He is oriented to person, place, and time. He appears well-developed and well-nourished.  HENT:  Head: Normocephalic and atraumatic.  Eyes: EOM are normal. Pupils are equal, round, and reactive to light.  Neck: No JVD present. Carotid bruit is not present.  Cardiovascular: Normal rate, regular rhythm and normal heart sounds.   No murmur heard. Pulmonary/Chest: Effort normal and breath sounds normal. He has no rales.  Musculoskeletal: He  exhibits no edema.  Unable to reproduce pain by pressing on chest. L-S spine is non-tender. No change in pain with L-S spine ROM, which is normal. Tender to palpation on left sciatic notch. Negative seated straight leg raise. He is able to heel-and-toe walk.   Neurological: He is alert and oriented to person, place, and time. He has normal strength. No sensory deficit. He displays no Babinski's sign on the right side. He displays no Babinski's sign on the left side.  Reflex Scores:      Patellar reflexes are 2+ on the right side and 2+ on  the left side.      Achilles reflexes are 2+ on the right side and 2+ on the left side. Skin: Skin is warm and dry.  Psychiatric: He has a normal mood and affect.   Controlled database reviewed listing from Dr. Dossie Arbour 3 prescriptions prescribed on 10/27/2013 or Oxycodone-Acetaminophen 10-325 #120, these were filled on August 23, September 22 and then September 28. Asked about this - he states this may have been by using his license to pick up his aunt's prescription and may have been from that. He denies filling the prescription on September 28th.   EKG: Sinus rhythm no acute findings.   [facility] reading (PRIMARY) by  Dr. Wendie Agreste Chest X-Morales: few increased perihilar markings. No discrete infiltrate L-S Spine: no acute findings.  Left Hip:no fx, no acute findings  Results for orders placed in visit on 01/05/14  POCT CBC      Result Value Ref Range   WBC 8.6  4.6 - 10.2 K/uL   Lymph, poc 3.5 (*) 0.6 - 3.4   POC LYMPH PERCENT 40.4  10 - 50 %L   MID (cbc) 0.5  0 - 0.9   POC MID % 5.4  0 - 12 %M   POC Granulocyte 4.7  2 - 6.9   Granulocyte percent 54.2  37 - 80 %G   RBC 4.78  4.69 - 6.13 M/uL   Hemoglobin 15.3  14.1 - 18.1 g/dL   HCT, POC 46.8  43.5 - 53.7 %   MCV 97.9 (*) 80 - 97 fL   MCH, POC 32.1 (*) 27 - 31.2 pg   MCHC 32.8  31.8 - 35.4 g/dL   RDW, POC 15.4     Platelet Count, POC 271  142 - 424 K/uL   MPV 7.2  0 - 99.8 fL    Assessment & Plan:  9:14 PM- Patient informed of current plan for treatment and evaluation and agrees with plan at this time.  Brent Mitchell Dahlstrom Brent Morales. is a 38 y.o. male Cough, Chest pain, unspecified chest pain type - Plan: Sedimentation rate, DG Chest 2 View, EKG 12-Lead  -possible bronchitis/LRTI, with pain form persistent cough.   -Zpak, trial of tessalon perles or Mucinex DM , check ESR, but doubt pericarditis at this point, ER/RTC precautions.   -DDX includes allergic or LPR - consider H1 and H2 blocker if not improving with above.   Left hip pain,  Left sided sciatica - Plan: meloxicam (MOBIC) 7.5 MG tablet  strength intact, reflexes intact, and negative seated SLR on exam. Piriformis source or discogenic possible. Start with meloxicam, hen consider MRI or ortho eval if not improving - sooner here or ER if worse.   Chronic neck pain - Plan: Ambulatory referral to Pain Clinic.  Concerns discussed with recent overlapping prescription form 11/30/13 and 12/06/13, but appeared to correlate monthly prior. Referral placed to establish care  with new pain mgt provider as left prior office.   Anxiety - discussed need for psychiatric follow up. Phone number for [facility] given. RTC/ER if worsening sx's.   Meds ordered this encounter  Medications  . meloxicam (MOBIC) 7.5 MG tablet    Sig: Take 1-2 tablets (7.5-15 mg total) by mouth daily.    Dispense:  30 tablet    Refill:  0  . benzonatate (TESSALON PERLES) 100 MG capsule    Sig: Take 1 capsule (100 mg total) by mouth 3 (three) times daily as needed for cough.    Dispense:  30 capsule    Refill:  0   Patient Instructions  To establish psychiatric care for anxiety treatment - try calling [facility]:  [phone].  We will refer you to pain management as discussed.  You may have a pinched nerve or muscle pushing on the nerve in the low back causing sciatica symptoms, but based on your exam tonight - can treat with mobic each morning (do not combine with other over the counter pain relievers), heat and gentle range of motion and stretching as tolerated. If not improving in next week to 10 days, can refer for MRI or orthopaedic provider to look at this further. Return to the clinic or go to the nearest emergency room if any of your symptoms worsen or new symptoms occur.  I suspect your chest pain is form coughing. Try the tessalon perles up to 3 times per day or Mucinex DM, cough drops/lozenges over the counter.  We checked another test for inflammation. You should receive a call or letter about your lab  results within the next week to 10 days. If pain not improving in next week to 10 days or worsens sooner - return here or go to an emergency room. Return to the clinic or go to the nearest emergency room if any of your symptoms worsen or new symptoms occur.

## 2014-01-05 NOTE — Patient Instructions (Addendum)
To establish psychiatric care for anxiety treatment - try calling [facility]:  [phone].  We will refer you to pain management as discussed.  You may have a pinched nerve or muscle pushing on the nerve in the low back causing sciatica symptoms, but based on your exam tonight - can treat with mobic each morning (do not combine with other over the counter pain relievers), heat and gentle range of motion and stretching as tolerated. If not improving in next week to 10 days, can refer for MRI or orthopaedic provider to look at this further. Return to the clinic or go to the nearest emergency room if any of your symptoms worsen or new symptoms occur.  I suspect your chest pain is form coughing. Try the tessalon perles up to 3 times per day or Mucinex DM, cough drops/lozenges over the counter.  We checked another test for inflammation. You should receive a call or letter about your lab results within the next week to 10 days. If pain not improving in next week to 10 days or worsens sooner - return here or go to an emergency room. Return to the clinic or go to the nearest emergency room if any of your symptoms worsen or new symptoms occur.  Chest Pain (Nonspecific) It is often hard to give a specific diagnosis for the cause of chest pain. There is always a chance that your pain could be related to something serious, such as a heart attack or a blood clot in the lungs. You need to follow up with your health care provider for further evaluation. CAUSES   Heartburn.  Pneumonia or bronchitis.  Anxiety or stress.  Inflammation around your heart (pericarditis) or lung (pleuritis or pleurisy).  A blood clot in the lung.  A collapsed lung (pneumothorax). It can develop suddenly on its own (spontaneous pneumothorax) or from trauma to the chest.  Shingles infection (herpes zoster virus). The chest wall is composed of bones, muscles, and cartilage. Any of these can be the source of the pain.  The bones can be  bruised by injury.  The muscles or cartilage can be strained by coughing or overwork.  The cartilage can be affected by inflammation and become sore (costochondritis). DIAGNOSIS  Lab tests or other studies may be needed to find the cause of your pain. Your health care provider may have you take a test called an ambulatory electrocardiogram (ECG). An ECG records your heartbeat patterns over a 24-hour period. You may also have other tests, such as:  Transthoracic echocardiogram (TTE). During echocardiography, sound waves are used to evaluate how blood flows through your heart.  Transesophageal echocardiogram (TEE).  Cardiac monitoring. This allows your health care provider to monitor your heart rate and rhythm in real time.  Holter monitor. This is a portable device that records your heartbeat and can help diagnose heart arrhythmias. It allows your health care provider to track your heart activity for several days, if needed.  Stress tests by exercise or by giving medicine that makes the heart beat faster. TREATMENT   Treatment depends on what may be causing your chest pain. Treatment may include:  Acid blockers for heartburn.  Anti-inflammatory medicine.  Pain medicine for inflammatory conditions.  Antibiotics if an infection is present.  You may be advised to change lifestyle habits. This includes stopping smoking and avoiding alcohol, caffeine, and chocolate.  You may be advised to keep your head raised (elevated) when sleeping. This reduces the chance of acid going backward from your stomach  into your esophagus. Most of the time, nonspecific chest pain will improve within 2-3 days with rest and mild pain medicine.  HOME CARE INSTRUCTIONS   If antibiotics were prescribed, take them as directed. Finish them even if you start to feel better.  For the next few days, avoid physical activities that bring on chest pain. Continue physical activities as directed.  Do not use any  tobacco products, including cigarettes, chewing tobacco, or electronic cigarettes.  Avoid drinking alcohol.  Only take medicine as directed by your health care provider.  Follow your health care provider's suggestions for further testing if your chest pain does not go away.  Keep any follow-up appointments you made. If you do not go to an appointment, you could develop lasting (chronic) problems with pain. If there is any problem keeping an appointment, call to reschedule. SEEK MEDICAL CARE IF:   Your chest pain does not go away, even after treatment.  You have a rash with blisters on your chest.  You have a fever. SEEK IMMEDIATE MEDICAL CARE IF:   You have increased chest pain or pain that spreads to your arm, neck, jaw, back, or abdomen.  You have shortness of breath.  You have an increasing cough, or you cough up blood.  You have severe back or abdominal pain.  You feel nauseous or vomit.  You have severe weakness.  You faint.  You have chills. This is an emergency. Do not wait to see if the pain will go away. Get medical help at once. Call your local emergency services (911 in [place] Do not drive yourself to the hospital. MAKE SURE YOU:   Understand these instructions.  Will watch your condition.  Will get help right away if you are not doing well or get worse. Document Released: 12/05/2004 Document Revised: 03/02/2013 Document Reviewed: 10/01/2007 [facility] Patient Information 2015 [employer], [place]. This information is not intended to replace advice given to you by your health care provider. Make sure you discuss any questions you have with your health care provider.  Cough, Adult  A cough is a reflex that helps clear your throat and airways. It can help heal the body or may be a reaction to an irritated airway. A cough may only last 2 or 3 weeks (acute) or may last more than 8 weeks (chronic).  CAUSES Acute cough:  Viral or bacterial infections. Chronic  cough:  Infections.  Allergies.  Asthma.  Post-nasal drip.  Smoking.  Heartburn or acid reflux.  Some medicines.  Chronic lung problems (COPD).  Cancer. SYMPTOMS   Cough.  Fever.  Chest pain.  Increased breathing rate.  High-pitched whistling sound when breathing (wheezing).  Colored mucus that you cough up (sputum). TREATMENT   A bacterial cough may be treated with antibiotic medicine.  A viral cough must run its course and will not respond to antibiotics.  Your caregiver may recommend other treatments if you have a chronic cough. HOME CARE INSTRUCTIONS   Only take over-the-counter or prescription medicines for pain, discomfort, or fever as directed by your caregiver. Use cough suppressants only as directed by your caregiver.  Use a cold steam vaporizer or humidifier in your bedroom or home to help loosen secretions.  Sleep in a semi-upright position if your cough is worse at night.  Rest as needed.  Stop smoking if you smoke. SEEK IMMEDIATE MEDICAL CARE IF:   You have pus in your sputum.  Your cough starts to worsen.  You cannot control your cough with  suppressants and are losing sleep.  You begin coughing up blood.  You have difficulty breathing.  You develop pain which is getting worse or is uncontrolled with medicine.  You have a fever. MAKE SURE YOU:   Understand these instructions.  Will watch your condition.  Will get help right away if you are not doing well or get worse. Document Released: 08/24/2010 Document Revised: 05/20/2011 Document Reviewed: 08/24/2010 [facility] Patient Information 2015 [place], [place]. This information is not intended to replace advice given to you by your health care provider. Make sure you discuss any questions you have with your health care provider.

## 2014-01-06 LAB — SEDIMENTATION RATE: Sed Rate: 1 mm/hr (ref 0–16)

## 2014-01-14 ENCOUNTER — Telehealth: Payer: Self-pay

## 2014-01-14 NOTE — Telephone Encounter (Signed)
Patient called again regarding labs. Please return call.

## 2014-01-14 NOTE — Telephone Encounter (Signed)
Patient returned called about lab results. Please call back at [phone]

## 2014-01-16 NOTE — Telephone Encounter (Signed)
Left message on machine with normal labs.  Follow up as instructed by Provider

## 2014-01-26 ENCOUNTER — Ambulatory Visit (INDEPENDENT_AMBULATORY_CARE_PROVIDER_SITE_OTHER): Payer: Medicare Other | Admitting: Family Medicine

## 2014-01-26 ENCOUNTER — Encounter (HOSPITAL_COMMUNITY): Payer: Self-pay | Admitting: Emergency Medicine

## 2014-01-26 VITALS — BP 120/88 | HR 92 | Temp 99.0°F | Resp 18 | Ht 71.75 in | Wt 165.0 lb

## 2014-01-26 DIAGNOSIS — K029 Dental caries, unspecified: Secondary | ICD-10-CM | POA: Insufficient documentation

## 2014-01-26 DIAGNOSIS — R600 Localized edema: Secondary | ICD-10-CM | POA: Diagnosis present

## 2014-01-26 DIAGNOSIS — K047 Periapical abscess without sinus: Secondary | ICD-10-CM | POA: Diagnosis not present

## 2014-01-26 DIAGNOSIS — Z8739 Personal history of other diseases of the musculoskeletal system and connective tissue: Secondary | ICD-10-CM | POA: Insufficient documentation

## 2014-01-26 DIAGNOSIS — Z8719 Personal history of other diseases of the digestive system: Secondary | ICD-10-CM | POA: Diagnosis not present

## 2014-01-26 DIAGNOSIS — Z720 Tobacco use: Secondary | ICD-10-CM | POA: Diagnosis not present

## 2014-01-26 DIAGNOSIS — Z716 Tobacco abuse counseling: Secondary | ICD-10-CM | POA: Diagnosis not present

## 2014-01-26 DIAGNOSIS — J3489 Other specified disorders of nose and nasal sinuses: Secondary | ICD-10-CM | POA: Insufficient documentation

## 2014-01-26 DIAGNOSIS — L03211 Cellulitis of face: Secondary | ICD-10-CM | POA: Diagnosis not present

## 2014-01-26 DIAGNOSIS — R220 Localized swelling, mass and lump, head: Secondary | ICD-10-CM | POA: Diagnosis not present

## 2014-01-26 DIAGNOSIS — M7989 Other specified soft tissue disorders: Secondary | ICD-10-CM | POA: Diagnosis not present

## 2014-01-26 NOTE — Patient Instructions (Signed)
Present to the ED for CT

## 2014-01-26 NOTE — ED Notes (Addendum)
[facility]. Concerning for infection in bone and sinuses. States swelling x 2 days. Dental issues on that side of his face.

## 2014-01-26 NOTE — Progress Notes (Signed)
   Subjective:    Patient ID: Brent BullocksHarold E Balinski Jr., male    DOB: [birth year], 38 y.o.   MRN: [id number]  Dental Pain  This is a new (tooth abscess localized to the left upper molars) problem. The current episode started yesterday (previous history of tooth abscess of the left lower jaw that required extraction 1 year ago). The problem occurs constantly. The problem has been gradually worsening. The pain is at a severity of 9/10. The pain is severe. Associated symptoms include facial pain, sinus pressure and thermal sensitivity. Pertinent negatives include no difficulty swallowing, fever or oral bleeding. He has tried ice and NSAIDs for the symptoms. The treatment provided no relief.   Review of Systems  Constitutional: Positive for appetite change and fatigue. Negative for fever.  HENT: Positive for dental problem, facial swelling and sinus pressure. Negative for hearing loss, mouth sores, rhinorrhea and trouble swallowing.   Eyes: Negative for discharge and redness.  Respiratory: Negative for cough and chest tightness.   Cardiovascular: Negative for chest pain.  Gastrointestinal: Negative for nausea, vomiting, abdominal pain and diarrhea.  Neurological: Positive for facial asymmetry and headaches.  Psychiatric/Behavioral: Positive for behavioral problems.       History of anxiety and chronic pain   Review of systems otherwise negative except for what is stated in HPI  HISTORY: Past Medical, Surgical, Social, and Family History Reviewed & Updated per EMR. Pertinent Historical Findings include: Tobacco abuse, previous teeth abscess and extraction in left low jaw, chronic uncontrolled anxiety, chronic pain from neck previous on chronic opioids current seeking pain management,      Objective:   Physical Exam  PHYSICAL EXAM:  VS: BP:120/88 mmHg  HR:92bpm  TEMP:99 F (37.2 C)(Oral)  RESP:96 %  HT:5' 11.75" (182.2 cm)   WT:165 lb (74.844 kg)  BMI:22.6 PHYSICAL EXAM: General:  Alert and  oriented, No acute distress.   HENT:  Normocephalic, Oral mucosa is moist. Left posterior upper molar with teeth necrosis, pus, and gum erythremia, swelling over the zygomatic arch and maxillary sinus, no frontal sinus pain but mild swelling around left eye,   Respiratory:  Lungs are clear to auscultation, Respirations are non-labored, Symmetrical chest wall expansion.   Cardiovascular:  Normal rate, Regular rhythm, No murmur, Good pulses equal in all extremities, No edema.   Gastrointestinal:  Soft, Non-tender, Non-distended, Normal bowel sounds, No organomegaly.   Integumentary:  Warm, Dry, No rash.   Neurologic:  Alert, Oriented, No focal defects Psychiatric:  Cooperative, Appropriate mood & affect.       Assessment & Plan:  Left upper jaw and posterior molar dental abscess:  Recommendations: Concern for dental abscess and high likelihood of maxillary sinus and possible zygomatic arch osteomyelitis given the severity of his swelling and necrotic condition and pus around his posterior molars, and erythremia around the gum like. There is no soft palate abscess present.   Recommendation patient goes to the ED for more complete evaluation with a maxillary facial CT to evaluation for facial, sinus, or osteomyelitis infection. If present progression of infection present patient will likely need to be admitted started on IV antibiotics and seen by maxilla facial surgery for I&D. If no abscess present patient could be treated with Augmentin.

## 2014-01-27 ENCOUNTER — Emergency Department (HOSPITAL_COMMUNITY): Payer: Medicare Other

## 2014-01-27 ENCOUNTER — Emergency Department (HOSPITAL_COMMUNITY)
Admission: EM | Admit: 2014-01-27 | Discharge: 2014-01-27 | Disposition: A | Discharge status: Home / Self Care | Payer: Medicare Other | Attending: Emergency Medicine | Admitting: Emergency Medicine

## 2014-01-27 DIAGNOSIS — L03211 Cellulitis of face: Secondary | ICD-10-CM | POA: Diagnosis not present

## 2014-01-27 DIAGNOSIS — K047 Periapical abscess without sinus: Secondary | ICD-10-CM

## 2014-01-27 DIAGNOSIS — R220 Localized swelling, mass and lump, head: Secondary | ICD-10-CM | POA: Diagnosis not present

## 2014-01-27 DIAGNOSIS — R609 Edema, unspecified: Secondary | ICD-10-CM

## 2014-01-27 MED ORDER — IOHEXOL 300 MG/ML  SOLN
100.0000 mL | Freq: Once | INTRAMUSCULAR | Status: AC | PRN
  Administered 2014-01-27: 75 mL via INTRAVENOUS

## 2014-01-27 MED ORDER — IBUPROFEN 800 MG PO TABS
800.0000 mg | ORAL_TABLET | Freq: Once | ORAL | Status: AC
  Administered 2014-01-27: 800 mg via ORAL
  Filled 2014-01-27: qty 1

## 2014-01-27 MED ORDER — PENICILLIN V POTASSIUM 500 MG PO TABS: 500.0000 mg | ORAL_TABLET | Freq: Four times a day (QID) | ORAL | Status: AC

## 2014-01-27 MED ORDER — HYDROMORPHONE HCL 1 MG/ML IJ SOLN
1.0000 mg | Freq: Once | INTRAMUSCULAR | Status: AC
  Administered 2014-01-27: 1 mg via INTRAVENOUS
  Filled 2014-01-27: qty 1

## 2014-01-27 MED ORDER — PENICILLIN V POTASSIUM 250 MG PO TABS
500.0000 mg | ORAL_TABLET | Freq: Once | ORAL | Status: AC
  Administered 2014-01-27: 500 mg via ORAL
  Filled 2014-01-27: qty 2

## 2014-01-27 MED ORDER — IBUPROFEN 800 MG PO TABS: 800.0000 mg | ORAL_TABLET | Freq: Three times a day (TID) | ORAL | Status: DC

## 2014-01-27 MED ORDER — HYDROMORPHONE HCL 1 MG/ML IJ SOLN
1.0000 mg | Freq: Once | INTRAMUSCULAR | Status: AC
Start: 2014-01-27 — End: 2014-01-27
  Administered 2014-01-27: 1 mg via INTRAVENOUS
  Filled 2014-01-27: qty 1

## 2014-01-27 NOTE — ED Provider Notes (Signed)
CSN: [id number]     Arrival date & time 01/26/14  2209 History   First MD Initiated Contact with Patient 01/27/14 0020     Chief Complaint  Patient presents with  . Facial Swelling     (Consider location/radiation/quality/duration/timing/severity/associated sxs/prior Treatment) HPI Complains of facial swelling left side of face for the past 2 days. Swelling goes fromleft jaw up to leftinfraorbital area.he feels the pain originates from bad teeth that he's had for several months He reports pain with worse extraocular movementand pain with opening his mouth. He was seen at urgent care center earlier today sent here for further evaluation. Denies fever denies vomiting denies other associated symptoms. No treatment prior to coming here.. Past Medical History  Diagnosis Date  . Hernia     Bilateral Inguinal  . Cervical stenosis of spinal canal    Past Surgical History  Procedure Laterality Date  . Inguinal hernia repair    . Thumb surgery     Family History  Problem Relation Age of Onset  . Hypertension Mother   . Heart disease Father   . Crohn's disease Maternal Aunt   . Colon cancer Paternal Grandfather    History  Substance Use Topics  . Smoking status: Current Every Day Smoker -- 1.00 packs/day for 25 years  . Smokeless tobacco: Never Used  . Alcohol Use: No    Review of Systems  Constitutional: Negative.   HENT: Positive for dental problem and sinus pressure.   Eyes: Negative for visual disturbance.  Respiratory: Negative.   Cardiovascular: Negative.   Gastrointestinal: Negative.   Musculoskeletal: Negative.   Skin: Negative.   Neurological: Negative.   Psychiatric/Behavioral: Negative.   All other systems reviewed and are negative.     Allergies  Iohexol  Home Medications   Prior to Admission medications   Not on File   BP 123/80 mmHg  Pulse 87  Temp(Src) 97.9 F (36.6 C) (Oral)  Resp 18  SpO2 96% Physical Exam  Constitutional: He appears  well-developed and well-nourished. No distress.  HENT:  Head: Normocephalic and atraumatic.  Swollen tender over left jaw and left maxillary area and left infraorbital area. No trismus. Poor dentition generally. Multiple caries. No fluctuance of the gingiva  Eyes: Conjunctivae and EOM are normal. Pupils are equal, round, and reactive to light. Right eye exhibits no discharge. Left eye exhibits no discharge.  Neck: Neck supple. No tracheal deviation present. No thyromegaly present.  Cardiovascular: Normal rate and regular rhythm.   No murmur heard. Pulmonary/Chest: Effort normal and breath sounds normal.  Abdominal: Soft. Bowel sounds are normal. He exhibits no distension. There is no tenderness.  Musculoskeletal: Normal range of motion. He exhibits no edema or tenderness.  Lymphadenopathy:    He has no cervical adenopathy.  Neurological: He is alert. Coordination normal.  Skin: Skin is warm and dry. No rash noted.  Psychiatric: He has a normal mood and affect. Judgment and thought content normal.  Nursing note and vitals reviewed.   ED Course  Procedures (including critical care time) Labs Review Labs Reviewed - No data to display  Imaging Review No results found.   EKG Interpretation None      continues to complain of pain to 2:25 AM after treatment with intravenous opioids. However feels ready to go home Results for orders placed or performed in visit on 01/05/14  Sedimentation rate  Result Value Ref Range   Sed Rate 1 0 - 16 mm/hr  POCT CBC  Result Value Ref Range  WBC 8.6 4.6 - 10.2 K/uL   Lymph, poc 3.5 (A) 0.6 - 3.4   POC LYMPH PERCENT 40.4 10 - 50 %L   MID (cbc) 0.5 0 - 0.9   POC MID % 5.4 0 - 12 %M   POC Granulocyte 4.7 2 - 6.9   Granulocyte percent 54.2 37 - 80 %G   RBC 4.78 4.69 - 6.13 M/uL   Hemoglobin 15.3 14.1 - 18.1 g/dL   HCT, POC 16.146.8 09.643.5 - 53.7 %   MCV 97.9 (A) 80 - 97 fL   MCH, POC 32.1 (A) 27 - 31.2 pg   MCHC 32.8 31.8 - 35.4 g/dL   RDW, POC  04.515.4 %   Platelet Count, POC 271 142 - 424 K/uL   MPV 7.2 0 - 99.8 fL   Dg Chest 2 View  01/05/2014   CLINICAL DATA:  Cough and chest pain.  EXAM: CHEST  2 VIEW  COMPARISON:  None.  FINDINGS: Normal heart size and mediastinal contours. No acute infiltrate or edema. No effusion or pneumothorax. No acute osseous findings.  IMPRESSION: No active cardiopulmonary disease.   Electronically Signed   By: Tiburcio PeaJonathan  Watts M.D.   On: 01/05/2014 21:52   Dg Lumbar Spine 2-3 Views  01/05/2014   CLINICAL DATA:  Constant, moderate, worsening left hip pain. No known injury.  EXAM: LUMBAR SPINE - 2-3 VIEW  COMPARISON:  None.  FINDINGS: There is no evidence of lumbar spine fracture. Alignment is normal. Intervertebral disc spaces are maintained.  IMPRESSION: Negative.   Electronically Signed   By: Tiburcio PeaJonathan  Watts M.D.   On: 01/05/2014 21:55   Dg Hip Complete Left  01/05/2014   CLINICAL DATA:  Left hip pain.  EXAM: LEFT HIP - COMPLETE 2+ VIEW  COMPARISON:  None.  FINDINGS: There is no evidence of hip fracture or dislocation. There is no evidence of arthropathy or other focal bone abnormality.  IMPRESSION: Negative.   Electronically Signed   By: Signa Kellaylor  Stroud M.D.   On: 01/05/2014 21:55   Ct Maxillofacial W/cm  01/27/2014   CLINICAL DATA:  LEFT-sided facial swelling for 2 days ; history of tooth abscess requiring extraction 1 year ago.  EXAM: CT MAXILLOFACIAL WITH CONTRAST  TECHNIQUE: Multidetector CT imaging of the maxillofacial structures was performed with intravenous contrast. Multiplanar CT image reconstructions were also generated. A small metallic BB was placed on the right temple in order to reliably differentiate right from left.  CONTRAST:  75mL OMNIPAQUE IOHEXOL 300 MG/ML  SOLN  COMPARISON:  CT of the head November 08, 2007  FINDINGS: LEFT facial reticulated fat centered about the LEFT mandible without subcutaneous gas, radiopaque foreign bodies, free fluid or fluid collections. Dental caries within all  residual teeth and multiple minimal periapical lucencies most consistent with abscess.  No facial fracture. Paranasal sinuses are well aerated. Ocular globes and orbital contents are unremarkable.  IMPRESSION: Poor dentition, LEFT facial cellulitis without abscess, this is likely odontogenic in origin.   Electronically Signed   By: Awilda Metroourtnay  Bloomer   On: 01/27/2014 01:53    MDM  prescription penicillin, ibuprofendental referral Final diagnoses:  None  diagnosis dental abscess     Doug SouSam Jeyda Siebel, MD 01/27/14 [phone]

## 2014-01-27 NOTE — ED Notes (Signed)
Dr. Jacobuwitz at the bedside.  

## 2014-01-27 NOTE — ED Notes (Signed)
Selena BattenKim, RN at the bedside to start IV.

## 2014-01-27 NOTE — Discharge Instructions (Signed)
Dental Abscess Call Dr. Alanson PulsWilliam Turner today to schedule the next available appointment. Tell office staff that you were seen here when scheduling the appointment A dental abscess is a collection of infected fluid (pus) from a bacterial infection in the inner part of the tooth (pulp). It usually occurs at the end of the tooth's root.  CAUSES   Severe tooth decay.  Trauma to the tooth that allows bacteria to enter into the pulp, such as a broken or chipped tooth. SYMPTOMS   Severe pain in and around the infected tooth.  Swelling and redness around the abscessed tooth or in the mouth or face.  Tenderness.  Pus drainage.  Bad breath.  Bitter taste in the mouth.  Difficulty swallowing.  Difficulty opening the mouth.  Nausea.  Vomiting.  Chills.  Swollen neck glands. DIAGNOSIS   A medical and dental history will be taken.  An examination will be performed by tapping on the abscessed tooth.  X-rays may be taken of the tooth to identify the abscess. TREATMENT The goal of treatment is to eliminate the infection. You may be prescribed antibiotic medicine to stop the infection from spreading. A root canal may be performed to save the tooth. If the tooth cannot be saved, it may be pulled (extracted) and the abscess may be drained.  HOME CARE INSTRUCTIONS  Only take over-the-counter or prescription medicines for pain, fever, or discomfort as directed by your caregiver.  Rinse your mouth (gargle) often with salt water ( tsp salt in 8 oz [250 ml] of warm water) to relieve pain or swelling.  Do not drive after taking pain medicine (narcotics).  Do not apply heat to the outside of your face.  Return to your dentist for further treatment as directed. SEEK MEDICAL CARE IF:  Your pain is not helped by medicine.  Your pain is getting worse instead of better. SEEK IMMEDIATE MEDICAL CARE IF:  You have a fever or persistent symptoms for more than 2-3 days.  You have a fever  and your symptoms suddenly get worse.  You have chills or a very bad headache.  You have problems breathing or swallowing.  You have trouble opening your mouth.  You have swelling in the neck or around the eye. Document Released: 02/25/2005 Document Revised: 11/20/2011 Document Reviewed: 06/05/2010 [facility] Patient Information 2015 [employer] [place]. This information is not intended to replace advice given to you by your health care provider. Make sure you discuss any questions you have with your health care provider.

## 2014-01-28 DIAGNOSIS — Z79899 Other long term (current) drug therapy: Secondary | ICD-10-CM | POA: Diagnosis not present

## 2014-01-28 DIAGNOSIS — M79609 Pain in unspecified limb: Secondary | ICD-10-CM | POA: Diagnosis not present

## 2014-01-28 DIAGNOSIS — M545 Low back pain: Secondary | ICD-10-CM | POA: Diagnosis not present

## 2014-01-28 DIAGNOSIS — G894 Chronic pain syndrome: Secondary | ICD-10-CM | POA: Diagnosis not present

## 2014-01-28 DIAGNOSIS — M542 Cervicalgia: Secondary | ICD-10-CM | POA: Diagnosis not present

## 2014-01-28 DIAGNOSIS — Z79891 Long term (current) use of opiate analgesic: Secondary | ICD-10-CM | POA: Diagnosis not present

## 2014-02-10 DIAGNOSIS — M5412 Radiculopathy, cervical region: Secondary | ICD-10-CM | POA: Diagnosis not present

## 2014-02-15 DIAGNOSIS — Z79891 Long term (current) use of opiate analgesic: Secondary | ICD-10-CM | POA: Diagnosis not present

## 2014-02-15 DIAGNOSIS — M5030 Other cervical disc degeneration, unspecified cervical region: Secondary | ICD-10-CM | POA: Diagnosis not present

## 2014-02-15 DIAGNOSIS — G894 Chronic pain syndrome: Secondary | ICD-10-CM | POA: Diagnosis not present

## 2014-02-15 DIAGNOSIS — Z79899 Other long term (current) drug therapy: Secondary | ICD-10-CM | POA: Diagnosis not present

## 2014-02-28 ENCOUNTER — Emergency Department (HOSPITAL_COMMUNITY)
Admission: EM | Admit: 2014-02-28 | Discharge: 2014-03-01 | Disposition: A | Discharge status: Home / Self Care | Payer: Medicare Other | Attending: Emergency Medicine | Admitting: Emergency Medicine

## 2014-02-28 ENCOUNTER — Encounter (HOSPITAL_COMMUNITY): Payer: Self-pay | Admitting: *Deleted

## 2014-02-28 ENCOUNTER — Observation Stay (HOSPITAL_COMMUNITY): Admission: AD | Admit: 2014-02-28 | Payer: Medicare Other | Source: Intra-hospital | Admitting: Psychiatry

## 2014-02-28 DIAGNOSIS — G8929 Other chronic pain: Secondary | ICD-10-CM | POA: Diagnosis not present

## 2014-02-28 DIAGNOSIS — Z720 Tobacco use: Secondary | ICD-10-CM | POA: Diagnosis not present

## 2014-02-28 DIAGNOSIS — Z8719 Personal history of other diseases of the digestive system: Secondary | ICD-10-CM | POA: Diagnosis not present

## 2014-02-28 DIAGNOSIS — R45851 Suicidal ideations: Secondary | ICD-10-CM

## 2014-02-28 DIAGNOSIS — F1310 Sedative, hypnotic or anxiolytic abuse, uncomplicated: Secondary | ICD-10-CM | POA: Diagnosis not present

## 2014-02-28 DIAGNOSIS — M79609 Pain in unspecified limb: Secondary | ICD-10-CM | POA: Diagnosis not present

## 2014-02-28 DIAGNOSIS — F329 Major depressive disorder, single episode, unspecified: Secondary | ICD-10-CM | POA: Diagnosis present

## 2014-02-28 DIAGNOSIS — F39 Unspecified mood [affective] disorder: Secondary | ICD-10-CM | POA: Diagnosis present

## 2014-02-28 DIAGNOSIS — F1110 Opioid abuse, uncomplicated: Secondary | ICD-10-CM | POA: Diagnosis not present

## 2014-02-28 DIAGNOSIS — Z79891 Long term (current) use of opiate analgesic: Secondary | ICD-10-CM | POA: Diagnosis not present

## 2014-02-28 DIAGNOSIS — F419 Anxiety disorder, unspecified: Secondary | ICD-10-CM | POA: Diagnosis not present

## 2014-02-28 DIAGNOSIS — Z8739 Personal history of other diseases of the musculoskeletal system and connective tissue: Secondary | ICD-10-CM | POA: Insufficient documentation

## 2014-02-28 DIAGNOSIS — Z791 Long term (current) use of non-steroidal anti-inflammatories (NSAID): Secondary | ICD-10-CM | POA: Insufficient documentation

## 2014-02-28 DIAGNOSIS — Z79899 Other long term (current) drug therapy: Secondary | ICD-10-CM | POA: Diagnosis not present

## 2014-02-28 DIAGNOSIS — M5030 Other cervical disc degeneration, unspecified cervical region: Secondary | ICD-10-CM | POA: Diagnosis not present

## 2014-02-28 DIAGNOSIS — M47812 Spondylosis without myelopathy or radiculopathy, cervical region: Secondary | ICD-10-CM | POA: Diagnosis not present

## 2014-02-28 DIAGNOSIS — G894 Chronic pain syndrome: Secondary | ICD-10-CM | POA: Diagnosis not present

## 2014-02-28 DIAGNOSIS — F32A Depression, unspecified: Secondary | ICD-10-CM | POA: Diagnosis present

## 2014-02-28 LAB — CBC
HCT: 47.8 % (ref 39.0–52.0)
Hemoglobin: 16.5 g/dL (ref 13.0–17.0)
MCH: 33.2 pg (ref 26.0–34.0)
MCHC: 34.5 g/dL (ref 30.0–36.0)
MCV: 96.2 fL (ref 78.0–100.0)
PLATELETS: 229 10*3/uL (ref 150–400)
RBC: 4.97 MIL/uL (ref 4.22–5.81)
RDW: 13.8 % (ref 11.5–15.5)
WBC: 8.6 10*3/uL (ref 4.0–10.5)

## 2014-02-28 LAB — RAPID URINE DRUG SCREEN, HOSP PERFORMED
Amphetamines: NOT DETECTED
BENZODIAZEPINES: POSITIVE — AB
Barbiturates: NOT DETECTED
Cocaine: NOT DETECTED
OPIATES: POSITIVE — AB
Tetrahydrocannabinol: NOT DETECTED

## 2014-02-28 LAB — COMPREHENSIVE METABOLIC PANEL
ALT: 12 U/L (ref 0–53)
AST: 19 U/L (ref 0–37)
Albumin: 4.3 g/dL (ref 3.5–5.2)
Alkaline Phosphatase: 97 U/L (ref 39–117)
Anion gap: 18 — ABNORMAL HIGH (ref 5–15)
BUN: 12 mg/dL (ref 6–23)
CALCIUM: 9.8 mg/dL (ref 8.4–10.5)
CO2: 21 mEq/L (ref 19–32)
Chloride: 98 mEq/L (ref 96–112)
Creatinine, Ser: 0.79 mg/dL (ref 0.50–1.35)
GFR calc Af Amer: >90 mL/min (ref >90)
GFR calc non Af Amer: >90 mL/min (ref >90)
Glucose, Bld: 96 mg/dL (ref 70–99)
Potassium: 4.3 mEq/L (ref 3.7–5.3)
SODIUM: 137 meq/L (ref 137–147)
TOTAL PROTEIN: 8 g/dL (ref 6.0–8.3)
Total Bilirubin: 0.6 mg/dL (ref 0.3–1.2)

## 2014-02-28 LAB — ETHANOL: Alcohol, Ethyl (B): <11 mg/dL (ref 0–11)

## 2014-02-28 LAB — ACETAMINOPHEN LEVEL: Acetaminophen (Tylenol), Serum: <15 ug/mL (ref 10–30)

## 2014-02-28 LAB — SALICYLATE LEVEL

## 2014-02-28 MED ORDER — IBUPROFEN 800 MG PO TABS
800.0000 mg | ORAL_TABLET | Freq: Four times a day (QID) | ORAL | Status: DC | PRN
Start: 2014-02-28 — End: 2014-03-01
  Administered 2014-02-28 – 2014-03-01 (×2): 800 mg via ORAL
  Filled 2014-02-28 (×2): qty 1

## 2014-02-28 MED ORDER — NICOTINE 21 MG/24HR TD PT24
21.0000 mg | MEDICATED_PATCH | Freq: Every day | TRANSDERMAL | Status: DC
  Administered 2014-02-28: 21 mg via TRANSDERMAL
  Filled 2014-02-28: qty 1

## 2014-02-28 NOTE — ED Notes (Signed)
Pt resting on stretcher, AAO x 3, no acute distress noted, will continue to monitor for safety.

## 2014-02-28 NOTE — ED Notes (Signed)
Security has wanded Pt belongings.

## 2014-02-28 NOTE — ED Provider Notes (Signed)
CSN: [id number]     Arrival date & time 02/28/14  1024 History   First MD Initiated Contact with Patient 02/28/14 1103     Chief Complaint  Patient presents with  . Suicidal      (Consider location/radiation/quality/duration/timing/severity/associated sxs/prior Treatment) HPI   Carey BullocksHarold E Yom Jr. is a 38 y.o. male brought in voluntarily for suicidal ideation. Patient has chronic pain from  cervical spinal stenosis, is followed at a pain management clinic and was discharged today because he broke his pain contract. Patient states that he took a family members MSIR when his prescribed hydrocodone was not helping. He denies any alcohol or other drug abuse. Patient was dismissed from the clinic and told his father that he was considering suicide. Patient has access to firearms in the home. He denies numbness, weakness, difficulty with fine motor skills, prior suicide attempt, prior psychiatric hospitalization homicidal ideation, auditory or visual hallucination.. Patient states that he was upset and is not really suicidal: denies plan  Past Medical History  Diagnosis Date  . Hernia     Bilateral Inguinal  . Cervical stenosis of spinal canal    Past Surgical History  Procedure Laterality Date  . Inguinal hernia repair    . Thumb surgery     Family History  Problem Relation Age of Onset  . Hypertension Mother   . Heart disease Father   . Crohn's disease Maternal Aunt   . Colon cancer Paternal Grandfather    History  Substance Use Topics  . Smoking status: Current Every Day Smoker -- 1.00 packs/day for 25 years  . Smokeless tobacco: Never Used  . Alcohol Use: No    Review of Systems  10 systems reviewed and found to be negative, except as noted in the HPI.   Allergies  Iohexol  Home Medications   Prior to Admission medications   Medication Sig Start Date End Date Taking? Authorizing Provider  ibuprofen (ADVIL,MOTRIN) 800 MG tablet Take 1 tablet (800 mg total) by mouth 3  (three) times daily. 01/27/14   Doug SouSam Jacubowitz, MD   BP 135/85 mmHg  Pulse 75  Temp(Src) 98.7 F (37.1 C) (Oral)  Resp 16  SpO2 96% Physical Exam  Constitutional: He is oriented to person, place, and time. He appears well-developed and well-nourished. No distress.  HENT:  Head: Normocephalic and atraumatic.  Mouth/Throat: Oropharynx is clear and moist.  Eyes: Conjunctivae and EOM are normal. Pupils are equal, round, and reactive to light.  Neck: Normal range of motion.  Cardiovascular: Normal rate, regular rhythm and intact distal pulses.   Pulmonary/Chest: Effort normal and breath sounds normal. No stridor. No respiratory distress. He has no wheezes. He has no rales. He exhibits no tenderness.  Abdominal: Soft. Bowel sounds are normal. There is no tenderness.  Musculoskeletal: Normal range of motion.  Neurological: He is alert and oriented to person, place, and time. No cranial nerve deficit.  Psychiatric: He has a normal mood and affect. His speech is normal and behavior is normal. He expresses suicidal ideation. He expresses no homicidal ideation. He expresses no suicidal plans.  Nursing note and vitals reviewed.   ED Course  Procedures (including critical care time) Labs Review Labs Reviewed - No data to display  Imaging Review No results found.   EKG Interpretation None      MDM   Final diagnoses:  None   Filed Vitals:   02/28/14 1059 02/28/14 1809 02/28/14 2140 03/01/14 0623  BP: 135/85 140/93 129/79 116/82  Pulse:  75 64 81 77  Temp: 98.7 F (37.1 C)  98.1 F (36.7 C) 98 F (36.7 C)  TempSrc: Oral  Oral Oral  Resp: 16 16 20 16   SpO2: 96% 99% 97% 99%    Medications  nicotine (NICODERM CQ - dosed in mg/24 hours) patch 21 mg (21 mg Transdermal Patch Applied 02/28/14 1719)  ibuprofen (ADVIL,MOTRIN) tablet 800 mg (800 mg Oral Given 02/28/14 1719)  dicyclomine (BENTYL) tablet 20 mg (20 mg Oral Given 03/01/14 0304)  hydrOXYzine (ATARAX/VISTARIL) tablet 25 mg  (25 mg Oral Given 03/01/14 0304)  loperamide (IMODIUM) capsule 2-4 mg (not administered)  methocarbamol (ROBAXIN) tablet 500 mg (500 mg Oral Given 03/01/14 0305)  naproxen (NAPROSYN) tablet 500 mg (500 mg Oral Given 03/01/14 0304)  ondansetron (ZOFRAN-ODT) disintegrating tablet 4 mg (4 mg Oral Given 03/01/14 0305)  cloNIDine (CATAPRES) tablet 0.1 mg (not administered)    Followed by  cloNIDine (CATAPRES) tablet 0.1 mg (not administered)    Followed by  cloNIDine (CATAPRES) tablet 0.1 mg (not administered)    Carey BullocksHarold E Wadel Jr. is a pleasant 38 y.o. male presenting with suicidal threat after patient was not given his choice of pain medications at his pain clinic. Patient is middle-aged white male with access to firearms, high risk for suicide completion.  Patient has mildly elevated anion gap of 18 with normal bicarbonate. This is likely secondary to dehydration. Blood work otherwise unremarkable  Patient is medically cleared for psychiatric evaluation will be transferred to the psych ED. TTS consulted, home meds and psych standard holding orders placed.      Wynetta Emeryicole Levester Waldridge, PA-C 03/01/14 [id number]  Mirian MoMatthew Gentry, MD 03/06/14 2350

## 2014-02-28 NOTE — ED Notes (Addendum)
Pt reports hx of cervical stenosis. Went to pain clinic today. Told his doctor that vicodin was not working and that Lucent Technologiespercocet worked for him and he wanted [employer]cocet. Pt also admitted to doctor that he had been taking someone else's morphine for pain. Pain clinic doctor told him he would not prescribe him percocet and that pt had to make an appointment for 1 month from now. Pt got upset and started thinking about wanting to kill himself. His plan was to shoot himself in the head. Pt has access to guns. Reports neck pain 8/10. At present pt denies SI, HI, AH/VH. Reports he will contract for safety in ED.

## 2014-02-28 NOTE — ED Notes (Signed)
Pt is in scrubs and has been wanded. Waiting for Security to wand belongings.

## 2014-02-28 NOTE — BH Assessment (Addendum)
Assessment Note  Brent BullocksHarold E Kelch Jr. is an 38 y.o. white male that reports that he became suicidal when his pain management clinic told him that they would not refill his prescription because he tested positive for morphine.  Patient reports that he was taking someone else's morphine because he had a headache.  Patient reports that the pain clinic doctor told him he would not prescribe him percocet and that patient had to make an appointment for 1 month from now.  Patient reports that he has a history of cervical stenosis since 2005. Patient reports that he has been receiving disability since 2008.  Patient reports this is why he has to have his pain medication.    Patient reports denies prior psychiatric hospitalization.  Patient denies prior substance abuse detox or treatment.  Patient BAL is <11 and his UDS is positive for opiates and benzos.    During the assessment the patient denies SI, HI, AH/VH. Patient then reported that he was upset when he stated that he wanted to kill himself with a gun.  Patient reports he will contract for safety.  When asked about his access to his grandfathers guns the patient reported that he did not need his grandfathers guns because he has his own guns at home.  Patient then stated that his father has taken all of the guns out of the house by now.     Axis I: Major Depression, Recurrent severe Axis II: Deferred Axis III:  Past Medical History  Diagnosis Date  . Hernia     Bilateral Inguinal  . Cervical stenosis of spinal canal    Axis IV: economic problems, housing problems, occupational problems, other psychosocial or environmental problems, problems related to social environment, problems with access to health care services and problems with primary support group Axis V: 31-40 impairment in reality testing  Past Medical History:  Past Medical History  Diagnosis Date  . Hernia     Bilateral Inguinal  . Cervical stenosis of spinal canal     Past Surgical  History  Procedure Laterality Date  . Inguinal hernia repair    . Thumb surgery      Family History:  Family History  Problem Relation Age of Onset  . Hypertension Mother   . Heart disease Father   . Crohn's disease Maternal Aunt   . Colon cancer Paternal Grandfather     Social History:  reports that he has been smoking.  He has never used smokeless tobacco. He reports that he does not drink alcohol or use illicit drugs.  Additional Social History:     CIWA: CIWA-Ar BP: 135/85 mmHg Pulse Rate: 75 COWS:    Allergies:  Allergies  Allergen Reactions  . Iohexol Other (See Comments)     Desc: PER MD/PER PATIENT NOT ALLERGIC 06/11/05 RM/PER RADIOLOGIST GIVE PREMEDICATION 06/12/05     Home Medications:  (Not in a hospital admission)  OB/GYN Status:  No LMP for male patient.  General Assessment Data Location of Assessment: [facility] ED Is this a Tele or Face-to-Face Assessment?: Face-to-Face Is this an Initial Assessment or a Re-assessment for this encounter?: Initial Assessment Living Arrangements: Parent (Lives with his mother.) Can pt return to current living arrangement?: Yes Admission Status: Voluntary Is patient capable of signing voluntary admission?: Yes Transfer from: Home Referral Source: Self/Family/Friend  Medical Screening Exam [facility] Walk[facility] ONLY) Medical Exam completed: Yes  [facility] Living Arrangements: Parent (Lives with his mother.) Name of Psychiatrist: None Reported Name of  Therapist: None Reported  Education Status Is patient currently in school?: No Current Grade: NA Highest grade of school patient has completed: NA Name of school: NA Contact person: NA  Risk to self with the past 6 months Suicidal Ideation: Yes-Currently Present Suicidal Intent: Yes-Currently Present Is patient at risk for suicide?: Yes Suicidal Plan?: Yes-Currently Present Specify Current Suicidal Plan: Shot himself with a gun.  (Patient reports that he has his own  guns at home. ) Access to Means: Yes Specify Access to Suicidal Means: Shooting himself (Patient now denies wanting to shot himself during the assess) What has been your use of drugs/alcohol within the last 12 months?: None Reported Previous Attempts/Gestures: No How many times?: 0 Other Self Harm Risks: None Reported Triggers for Past Attempts:  (NA) Intentional Self Injurious Behavior: None Family Suicide History: No Recent stressful life event(s): Job Loss, Financial Problems (Pain clininc will not give him anymore meds. ) Persecutory voices/beliefs?: No Depression: Yes Depression Symptoms: Despondent, Fatigue, Feeling worthless/self pity Substance abuse history and/or treatment for substance abuse?: No Suicide prevention information given to non-admitted patients: Yes  Risk to Others within the past 6 months Homicidal Ideation: No Thoughts of Harm to Others: No Current Homicidal Intent: No Current Homicidal Plan: No Access to Homicidal Means: No Identified Victim: NA History of harm to others?: No Assessment of Violence: None Noted Violent Behavior Description: NA Does patient have access to weapons?: Yes (Comment) (Reports that his father took the guns out of the house. ) Criminal Charges Pending?: No Does patient have a court date: No  Psychosis Hallucinations: None noted Delusions: None noted  Mental Status Report Appear/Hygiene: Disheveled Eye Contact: Fair Motor Activity: Freedom of movement Speech: Logical/coherent Level of Consciousness: Alert, Restless Mood: Depressed, Anxious Affect: Anxious Anxiety Level: Minimal Thought Processes: Coherent, Relevant Judgement: Unimpaired Orientation: Person, Place, Time, Situation Obsessive Compulsive Thoughts/Behaviors: None  Cognitive Functioning Concentration: Decreased Memory: Recent Intact, Remote Intact IQ: Average Insight: Fair Impulse Control: Poor Appetite: Fair Weight Loss: 0 Weight Gain: 0 Sleep:  Decreased Total Hours of Sleep: 6 Vegetative Symptoms: Decreased grooming, Staying in bed  ADLScreening [facility] Assessment Services) Patient's cognitive ability adequate to safely complete daily activities?: Yes Patient able to express need for assistance with ADLs?: Yes Independently performs ADLs?: Yes (appropriate for developmental age)  Prior Inpatient Therapy Prior Inpatient Therapy: No Prior Therapy Dates: NA Prior Therapy Facilty/Provider(s): NA Reason for Treatment: NA  Prior Outpatient Therapy Prior Outpatient Therapy: No Prior Therapy Dates: NA Prior Therapy Facilty/Provider(s): NA Reason for Treatment: NA  ADL Screening (condition at time of admission) Patient's cognitive ability adequate to safely complete daily activities?: Yes Patient able to express need for assistance with ADLs?: Yes Independently performs ADLs?: Yes (appropriate for developmental age)             Advance Directives (For Healthcare) Does patient have an advance directive?: No Would patient like information on creating an advanced directive?: No - patient declined information    Additional Information 1:1 In Past 12 Months?: No CIRT Risk: No Elopement Risk: No Does patient have medical clearance?: Yes     Disposition: Pending psych disposition.  Disposition Initial Assessment Completed for this Encounter: Yes Disposition of Patient: Other dispositions Other disposition(s): Other (Comment) (Pending psych disposition. )  On Site Evaluation by:   Reviewed with Physician:    Phillip HealStevenson, Weslynn Ke LaVerne 02/28/2014 5:50 PM

## 2014-02-28 NOTE — ED Notes (Signed)
Patient transferred over from Triage.  States he was at the pain clinic today and they would not give him any medications so he expressed thoughts of suicide.  Stated he was going to get his grandfathers gun and kill himself.  Patient states he was receiving hydrocodone which was not helping so he was taking morphine that was not prescribed for him.  Clinic would not prescribe anything because he tested positive for the morphine.  He was told he could come back for a follow up in a month.  He states he also takes Xanax which he does not have a prescription for.  He was advised he would see a doctor in the morning, but would probably see a counselor this evening sometime.  He was oriented to the unit and to the unit rules.

## 2014-02-28 NOTE — BH Assessment (Addendum)
Per Catha NottinghamJamison, NP patient has been accepted to [facility].  Writer [facility].

## 2014-02-28 NOTE — BH Assessment (Signed)
[name] will assess the patient.

## 2014-02-28 NOTE — ED Notes (Signed)
Patients mother had been here for nearly an hour and was asked to leave.  He advised me she was not going anywhere until Ava came back into the room.  I advised them that she had left, but had conferred with the NP and patient was accepted to [facility].  Patient started begging the mother to take him home.  Stated he would "get ill" if they didn't get his medicine.  Mother tried to tell him to go along with the plan and she would pick him up tomorrow as soon as they said he could go.  She then left the unit and patient walked around the corner and punched the wall.  He then went into his room and slammed the door, shoved his tray table, kicked over his trash bag and scattered items around his room.  AC Berneice Heinrichina Tate notified of negative behaviors that may affect his ability to stay in OBS.  She advised that she would follow up with the next shift AC to determine placement.  Patient is aware.

## 2014-03-01 ENCOUNTER — Encounter (HOSPITAL_COMMUNITY): Payer: Self-pay | Admitting: Registered Nurse

## 2014-03-01 DIAGNOSIS — F39 Unspecified mood [affective] disorder: Secondary | ICD-10-CM | POA: Diagnosis present

## 2014-03-01 DIAGNOSIS — F419 Anxiety disorder, unspecified: Secondary | ICD-10-CM | POA: Diagnosis not present

## 2014-03-01 DIAGNOSIS — R45851 Suicidal ideations: Secondary | ICD-10-CM | POA: Insufficient documentation

## 2014-03-01 MED ORDER — CLONIDINE HCL 0.1 MG PO TABS: 0.1000 mg | ORAL_TABLET | ORAL | Status: DC

## 2014-03-01 MED ORDER — METHOCARBAMOL 500 MG PO TABS
500.0000 mg | ORAL_TABLET | Freq: Three times a day (TID) | ORAL | Status: DC | PRN
  Administered 2014-03-01: 500 mg via ORAL
  Filled 2014-03-01: qty 1

## 2014-03-01 MED ORDER — NAPROXEN 500 MG PO TABS
500.0000 mg | ORAL_TABLET | Freq: Two times a day (BID) | ORAL | Status: DC | PRN
  Administered 2014-03-01: 500 mg via ORAL
  Filled 2014-03-01: qty 1

## 2014-03-01 MED ORDER — CLONIDINE HCL 0.1 MG PO TABS: 0.1000 mg | ORAL_TABLET | Freq: Every day | ORAL | Status: DC

## 2014-03-01 MED ORDER — LOPERAMIDE HCL 2 MG PO CAPS: 2.0000 mg | ORAL_CAPSULE | ORAL | Status: DC | PRN

## 2014-03-01 MED ORDER — HYDROXYZINE HCL 25 MG PO TABS
25.0000 mg | ORAL_TABLET | Freq: Four times a day (QID) | ORAL | Status: DC | PRN
  Administered 2014-03-01 (×2): 25 mg via ORAL
  Filled 2014-03-01 (×2): qty 1

## 2014-03-01 MED ORDER — DICYCLOMINE HCL 20 MG PO TABS
20.0000 mg | ORAL_TABLET | Freq: Four times a day (QID) | ORAL | Status: DC | PRN
  Administered 2014-03-01 (×2): 20 mg via ORAL
  Filled 2014-03-01 (×2): qty 1

## 2014-03-01 MED ORDER — ONDANSETRON 4 MG PO TBDP
4.0000 mg | ORAL_TABLET | Freq: Four times a day (QID) | ORAL | Status: DC | PRN
  Administered 2014-03-01: 4 mg via ORAL
  Filled 2014-03-01: qty 1

## 2014-03-01 MED ORDER — CLONIDINE HCL 0.1 MG PO TABS
0.1000 mg | ORAL_TABLET | Freq: Four times a day (QID) | ORAL | Status: DC
  Administered 2014-03-01: 0.1 mg via ORAL
  Filled 2014-03-01: qty 1

## 2014-03-01 NOTE — ED Notes (Signed)
Pt nibbling on crackers & sipping Gingerale prior to taking meds.

## 2014-03-01 NOTE — Discharge Instructions (Signed)
Depression Depression is feeling sad, low, down in the dumps, blue, gloomy, or empty. In general, there are two kinds of depression:  Normal sadness or grief. This can happen after something upsetting. It often goes away on its own within 2 weeks. After losing a loved one (bereavement), normal sadness and grief may last longer than two weeks. It usually gets better with time.  Clinical depression. This kind lasts longer than normal sadness or grief. It keeps you from doing the things you normally do in life. It is often hard to function at home, work, or at school. It may affect your relationships with others. Treatment is often needed. GET HELP RIGHT AWAY IF:  You have thoughts about hurting yourself or others.  You lose touch with reality (psychotic symptoms). You may:  See or hear things that are not real.  Have untrue beliefs about your life or people around you.  Your medicine is giving you problems. MAKE SURE YOU:  Understand these instructions.  Will watch your condition.  Will get help right away if you are not doing well or get worse. Document Released: 03/30/2010 Document Revised: 07/12/2013 Document Reviewed: 06/27/2011 [facility] Patient Information 2015 [place], [place]. This information is not intended to replace advice given to you by your health care provider. Make sure you discuss any questions you have with your health care provider.  Stress and Stress Management Stress is a normal reaction to life events. It is what you feel when life demands more than you are used to or more than you can handle. Some stress can be useful. For example, the stress reaction can help you catch the last bus of the day, study for a test, or meet a deadline at work. But stress that occurs too often or for too long can cause problems. It can affect your emotional health and interfere with relationships and normal daily activities. Too much stress can weaken your immune system and increase your  risk for physical illness. If you already have a medical problem, stress can make it worse. CAUSES  All sorts of life events may cause stress. An event that causes stress for one person may not be stressful for another person. Major life events commonly cause stress. These may be positive or negative. Examples include losing your job, moving into a new home, getting married, having a baby, or losing a loved one. Less obvious life events may also cause stress, especially if they occur day after day or in combination. Examples include working long hours, driving in traffic, caring for children, being in debt, or being in a difficult relationship. SIGNS AND SYMPTOMS Stress may cause emotional symptoms including, the following:  Anxiety. This is feeling worried, afraid, on edge, overwhelmed, or out of control.  Anger. This is feeling irritated or impatient.  Depression. This is feeling sad, down, helpless, or guilty.  Difficulty focusing, remembering, or making decisions. Stress may cause physical symptoms, including the following:   Aches and pains. These may affect your head, neck, back, stomach, or other areas of your body.  Tight muscles or clenched jaw.  Low energy or trouble sleeping. Stress may cause unhealthy behaviors, including the following:   Eating to feel better (overeating) or skipping meals.  Sleeping too little, too much, or both.  Working too much or putting off tasks (procrastination).  Smoking, drinking alcohol, or using drugs to feel better. DIAGNOSIS  Stress is diagnosed through an assessment by your health care provider. Your health care provider will ask  questions about your symptoms and any stressful life events.Your health care provider will also ask about your medical history and may order blood tests or other tests. Certain medical conditions and medicine can cause physical symptoms similar to stress. Mental illness can cause emotional symptoms and unhealthy  behaviors similar to stress. Your health care provider may refer you to a mental health professional for further evaluation.  TREATMENT  Stress management is the recommended treatment for stress.The goals of stress management are reducing stressful life events and coping with stress in healthy ways.  Techniques for reducing stressful life events include the following:  Stress identification. Self-monitor for stress and identify what causes stress for you. These skills may help you to avoid some stressful events.  Time management. Set your priorities, keep a calendar of events, and learn to say "no." These tools can help you avoid making too many commitments. Techniques for coping with stress include the following:  Rethinking the problem. Try to think realistically about stressful events rather than ignoring them or overreacting. Try to find the positives in a stressful situation rather than focusing on the negatives.  Exercise. Physical exercise can release both physical and emotional tension. The key is to find a form of exercise you enjoy and do it regularly.  Relaxation techniques. These relax the body and mind. Examples include yoga, meditation, tai chi, biofeedback, deep breathing, progressive muscle relaxation, listening to music, being out in nature, journaling, and other hobbies. Again, the key is to find one or more that you enjoy and can do regularly.  Healthy lifestyle. Eat a balanced diet, get plenty of sleep, and do not smoke. Avoid using alcohol or drugs to relax.  Strong support network. Spend time with family, friends, or other people you enjoy being around.Express your feelings and talk things over with someone you trust. Counseling or talktherapy with a mental health professional may be helpful if you are having difficulty managing stress on your own. Medicine is typically not recommended for the treatment of stress.Talk to your health care provider if you think you need  medicine for symptoms of stress. HOME CARE INSTRUCTIONS  Keep all follow-up visits as directed by your health care provider.  Take all medicines as directed by your health care provider. SEEK MEDICAL CARE IF:  Your symptoms get worse or you start having new symptoms.  You feel overwhelmed by your problems and can no longer manage them on your own. SEEK IMMEDIATE MEDICAL CARE IF:  You feel like hurting yourself or someone else. Document Released: 08/21/2000 Document Revised: 07/12/2013 Document Reviewed: 10/20/2012 [facility] Patient Information 2015 [place], [place]. This information is not intended to replace advice given to you by your health care provider. Make sure you discuss any questions you have with your health care provider.

## 2014-03-01 NOTE — [facility] Suicide Risk Assessment (Cosign Needed)
Suicide Risk Assessment  Discharge Assessment     Demographic Factors:  Male and Caucasian  Total Time spent with patient: 45 minutes  Psychiatric Specialty Exam:     Blood pressure 125/79, pulse 73, temperature 98.2 F (36.8 C), temperature source Oral, resp. rate 18, SpO2 99 %.There is no weight on file to calculate BMI.  General Appearance: Casual  Eye Contact::  Good  Speech:  Clear and Coherent and Normal Rate  Volume:  Normal  Mood:  Depressed  Affect:  Congruent  Thought Process:  Circumstantial and Goal Directed  Orientation:  Full (Time, Place, and Person)  Thought Content:  Rumination  Suicidal Thoughts:  No  Homicidal Thoughts:  No  Memory:  Immediate;   Good Recent;   Good Remote;   Good  Judgement:  Intact  Insight:  Present  Psychomotor Activity:  Normal  Concentration:  Fair  Recall:  Good  Fund of Knowledge:Good  Language: Good  Akathisia:  No  Handed:  Right  AIMS (if indicated):     Assets:  Communication Skills Desire for Improvement Housing Social Support  Sleep:       Musculoskeletal: Strength & Muscle Tone: within normal limits Gait & Station: normal Patient leans: N/A   Mental Status Per Nursing Assessment::   On Admission:     Current Mental Status by Physician: Denies suicidal/homicidal ideation, psychosis, and paranoia  Loss Factors: NA  Historical Factors: NA  Risk Reduction Factors:   Sense of responsibility to family, Living with another person, especially a relative and Positive social support  Continued Clinical Symptoms:  Medical Diagnoses and Treatments/Surgeries  Cognitive Features That Contribute To Risk:  Closed-mindedness    Suicide Risk:  Minimal: No identifiable suicidal ideation.  Patients presenting with no risk factors but with morbid ruminations; may be classified as minimal risk based on the severity of the depressive symptoms  Discharge Diagnoses:  AXIS I: Anxiety Disorder NOS and Mood Disorder  NOS AXIS II: Deferred AXIS III:  Past Medical History  Diagnosis Date  . Hernia     Bilateral Inguinal  . Cervical stenosis of spinal canal    AXIS IV: other psychosocial or environmental problems AXIS V: 61-70 mild symptoms   Is patient on multiple antipsychotic therapies at discharge:  No   Has Patient had three or more failed trials of antipsychotic monotherapy by history:  No  Recommended Plan for Multiple Antipsychotic Therapies: NA    Rankin, Shuvon, FNP-BC 03/01/2014, 1:27 PM

## 2014-03-01 NOTE — ED Notes (Signed)
Heat pack applied to posterior neck.

## 2014-03-01 NOTE — BH Assessment (Signed)
Spoke to [employer] IncLatricia London, RN who said Pt has been cooperative and resting. Contacted Asencion NobleKenesha Herbin, AC at [facility], who said due to Pt's aggressive behavior earlier he is not appropriate for Observation Unit and current the adult unit is at capacity. TTS will contact other facilities for placement.  Harlin RainFord Ellis Ria CommentWarrick Jr, LPC, [facility] Triage Specialist [phone]

## 2014-03-01 NOTE — ED Notes (Signed)
Pt states he has been taking Morphine and Xanax for a few years and has not taken meds recently.  Pt now states he is feeling bad and nauseated.  Dr Norlene Campbelltter notified.

## 2014-03-01 NOTE — Consult Note (Signed)
[facility] Face-to-Face Psychiatry Consult   Reason for Consult:  Suicidal ideation  Referring Physician:  EDP  Brent Morales. is an 38 y.o. male. Total Time spent with patient: 45 minutes  Assessment: AXIS I:  Anxiety Disorder NOS and Mood Disorder NOS AXIS II:  Deferred AXIS III:   Past Medical History  Diagnosis Date  . Hernia     Bilateral Inguinal  . Cervical stenosis of spinal canal    AXIS IV:  other psychosocial or environmental problems AXIS V:  61-70 mild symptoms  Plan:  No evidence of imminent risk to self or others at present.   Patient does not meet criteria for psychiatric inpatient admission. Supportive therapy provided about ongoing stressors. Discussed crisis plan, support from social network, calling 911, coming to the Emergency Department, and calling Suicide Hotline.  Subjective:   Brent Morales. is a 38 y.o. male patient presented to [facility] with complaints of suicidal ideation.  HPI:  Patient states "It started at the pain clinic with my doctor.  He knows how much pain I am in and he wants me to continue a medicine that I told him that was not working.  He found morphine in my system and wants me to have a clean urine before he is willing to make any changes. I guess we didn't see eye to eye.  He was trying to explain and I just walked out.  I didn't want to say anything cause I felt like I was going to go off; so I called my father and vented to him.  I guess he was concerned and brought me here.  I was just mad yesterday and venting.  I don't want to kill myself or anybody else. I do understand what the doctor was talking about now he  Tried to explain; I was just so hot."  Patient denies suicidal/homicidal ideation, psychosis, and paranoia.  Patient denies a psychiatric history.     HPI Elements:   Location:  Mood disorder. Quality:  sucicidal ideation. Severity:  Mad with pain management doctor related to medications and had an outburst. Timing:  1  day. Review of Systems  Musculoskeletal: Positive for back pain and neck pain (Chronic).  Psychiatric/Behavioral: Negative for depression, suicidal ideas, hallucinations, memory loss and substance abuse. The patient is not nervous/anxious and does not have insomnia.   All other systems reviewed and are negative.  Family History  Problem Relation Age of Onset  . Hypertension Mother   . Heart disease Father   . Crohn's disease Maternal Aunt   . Colon cancer Paternal Grandfather     Past Psychiatric History: Past Medical History  Diagnosis Date  . Hernia     Bilateral Inguinal  . Cervical stenosis of spinal canal     reports that he has been smoking.  He has never used smokeless tobacco. He reports that he does not drink alcohol or use illicit drugs. Family History  Problem Relation Age of Onset  . Hypertension Mother   . Heart disease Father   . Crohn's disease Maternal Aunt   . Colon cancer Paternal Grandfather    Family History Substance Abuse: No Family Supports: Yes, List: Living Arrangements: Parent (Lives with his mother.) Can pt return to current living arrangement?: Yes   Allergies:   Allergies  Allergen Reactions  . Iohexol Other (See Comments)     Desc: PER MD/PER PATIENT NOT ALLERGIC 06/11/05 RM/PER RADIOLOGIST GIVE PREMEDICATION 06/12/05     ACT Assessment  Complete:  Yes:    Educational Status    Risk to Self: Risk to self with the past 6 months Suicidal Ideation: Yes-Currently Present Suicidal Intent: Yes-Currently Present Is patient at risk for suicide?: Yes Suicidal Plan?: Yes-Currently Present Specify Current Suicidal Plan: Shot himself with a gun.  (Patient reports that he has his own guns at home. ) Access to Means: Yes Specify Access to Suicidal Means: Shooting himself (Patient now denies wanting to shot himself during the assess) What has been your use of drugs/alcohol within the last 12 months?: None Reported Previous Attempts/Gestures: No How  many times?: 0 Other Self Harm Risks: None Reported Triggers for Past Attempts:  (NA) Intentional Self Injurious Behavior: None Family Suicide History: No Recent stressful life event(s): Job Loss, Financial Problems (Pain clininc will not give him anymore meds. ) Persecutory voices/beliefs?: No Depression: Yes Depression Symptoms: Despondent, Fatigue, Feeling worthless/self pity Substance abuse history and/or treatment for substance abuse?: No Suicide prevention information given to non-admitted patients: Yes  Risk to Others: Risk to Others within the past 6 months Homicidal Ideation: No Thoughts of Harm to Others: No Current Homicidal Intent: No Current Homicidal Plan: No Access to Homicidal Means: No Identified Victim: NA History of harm to others?: No Assessment of Violence: None Noted Violent Behavior Description: NA Does patient have access to weapons?: Yes (Comment) (Reports that his father took the guns out of the house. ) Criminal Charges Pending?: No Does patient have a court date: No  Abuse:    Prior Inpatient Therapy: Prior Inpatient Therapy Prior Inpatient Therapy: No Prior Therapy Dates: NA Prior Therapy Facilty/Provider(s): NA Reason for Treatment: NA  Prior Outpatient Therapy: Prior Outpatient Therapy Prior Outpatient Therapy: No Prior Therapy Dates: NA Prior Therapy Facilty/Provider(s): NA Reason for Treatment: NA  Additional Information: Additional Information 1:1 In Past 12 Months?: No CIRT Risk: No Elopement Risk: No Does patient have medical clearance?: Yes                  Objective: Blood pressure 125/79, pulse 73, temperature 98.2 F (36.8 C), temperature source Oral, resp. rate 18, SpO2 99 %.There is no weight on file to calculate BMI. Results for orders placed or performed during the hospital encounter of 02/28/14 (from the past 72 hour(s))  Acetaminophen level     Status: None   Collection Time: 02/28/14 11:50 AM  Result Value  Ref Range   Acetaminophen (Tylenol), Serum <15.0 10 - 30 ug/mL    Comment:        THERAPEUTIC CONCENTRATIONS VARY SIGNIFICANTLY. A RANGE OF 10-30 ug/mL MAY BE AN EFFECTIVE CONCENTRATION FOR MANY PATIENTS. HOWEVER, SOME ARE BEST TREATED AT CONCENTRATIONS OUTSIDE THIS RANGE. ACETAMINOPHEN CONCENTRATIONS >150 ug/mL AT 4 HOURS AFTER INGESTION AND >50 ug/mL AT 12 HOURS AFTER INGESTION ARE OFTEN ASSOCIATED WITH TOXIC REACTIONS.   CBC     Status: None   Collection Time: 02/28/14 11:50 AM  Result Value Ref Range   WBC 8.6 4.0 - 10.5 K/uL   RBC 4.97 4.22 - 5.81 MIL/uL   Hemoglobin 16.5 13.0 - 17.0 g/dL   HCT 47.8 39.0 - 52.0 %   MCV 96.2 78.0 - 100.0 fL   MCH 33.2 26.0 - 34.0 pg   MCHC 34.5 30.0 - 36.0 g/dL   RDW 13.8 11.5 - 15.5 %   Platelets 229 150 - 400 K/uL  Comprehensive metabolic panel     Status: Abnormal   Collection Time: 02/28/14 11:50 AM  Result Value Ref Range  Sodium 137 137 - 147 mEq/L   Potassium 4.3 3.7 - 5.3 mEq/L   Chloride 98 96 - 112 mEq/L   CO2 21 19 - 32 mEq/L   Glucose, Bld 96 70 - 99 mg/dL   BUN 12 6 - 23 mg/dL   Creatinine, Ser 0.79 0.50 - 1.35 mg/dL   Calcium 9.8 8.4 - 10.5 mg/dL   Total Protein 8.0 6.0 - 8.3 g/dL   Albumin 4.3 3.5 - 5.2 g/dL   AST 19 0 - 37 U/L    Comment: SLIGHT HEMOLYSIS HEMOLYSIS AT THIS LEVEL MAY AFFECT RESULT    ALT 12 0 - 53 U/L   Alkaline Phosphatase 97 39 - 117 U/L   Total Bilirubin 0.6 0.3 - 1.2 mg/dL   GFR calc non Af Amer >90 >90 mL/min   GFR calc Af Amer >90 >90 mL/min    Comment: (NOTE) The eGFR has been calculated using the CKD EPI equation. This calculation has not been validated in all clinical situations. eGFR's persistently <90 mL/min signify possible Chronic Kidney Disease.    Anion gap 18 (H) 5 - 15  Ethanol (ETOH)     Status: None   Collection Time: 02/28/14 11:50 AM  Result Value Ref Range   Alcohol, Ethyl (B) <11 0 - 11 mg/dL    Comment:        LOWEST DETECTABLE LIMIT FOR SERUM ALCOHOL IS 11  mg/dL FOR MEDICAL PURPOSES ONLY   Salicylate level     Status: Abnormal   Collection Time: 02/28/14 11:50 AM  Result Value Ref Range   Salicylate Lvl <6.7 (L) 2.8 - 20.0 mg/dL  Urine Drug Screen     Status: Abnormal   Collection Time: 02/28/14 12:07 PM  Result Value Ref Range   Opiates POSITIVE (A) NONE DETECTED   Cocaine NONE DETECTED NONE DETECTED   Benzodiazepines POSITIVE (A) NONE DETECTED   Amphetamines NONE DETECTED NONE DETECTED   Tetrahydrocannabinol NONE DETECTED NONE DETECTED   Barbiturates NONE DETECTED NONE DETECTED    Comment:        DRUG SCREEN FOR MEDICAL PURPOSES ONLY.  IF CONFIRMATION IS NEEDED FOR ANY PURPOSE, NOTIFY LAB WITHIN 5 DAYS.        LOWEST DETECTABLE LIMITS FOR URINE DRUG SCREEN Drug Class       Cutoff (ng/mL) Amphetamine      1000 Barbiturate      200 Benzodiazepine   672 Tricyclics       094 Opiates          300 Cocaine          300 THC              50    Labs are reviewed see values above.  Medications reviewed and no changes made  Current Facility-Administered Medications  Medication Dose Route Frequency Provider Last Rate Last Dose  . cloNIDine (CATAPRES) tablet 0.1 mg  0.1 mg Oral QID Kalman Drape, MD   0.1 mg at 03/01/14 1010   Followed by  . [START ON 03/03/2014] cloNIDine (CATAPRES) tablet 0.1 mg  0.1 mg Oral BH-qamhs Kalman Drape, MD       Followed by  . [START ON 03/06/2014] cloNIDine (CATAPRES) tablet 0.1 mg  0.1 mg Oral QAC breakfast Kalman Drape, MD      . dicyclomine (BENTYL) tablet 20 mg  20 mg Oral Q6H PRN Kalman Drape, MD   20 mg at 03/01/14 1010  . hydrOXYzine (ATARAX/VISTARIL) tablet 25 mg  25 mg Oral Q6H PRN Kalman Drape, MD   25 mg at 03/01/14 1010  . ibuprofen (ADVIL,MOTRIN) tablet 800 mg  800 mg Oral Q6H PRN Waylan Boga, NP   800 mg at 03/01/14 0750  . loperamide (IMODIUM) capsule 2-4 mg  2-4 mg Oral PRN Kalman Drape, MD      . methocarbamol (ROBAXIN) tablet 500 mg  500 mg Oral Q8H PRN Kalman Drape, MD   500 mg at  03/01/14 0305  . naproxen (NAPROSYN) tablet 500 mg  500 mg Oral BID PRN Kalman Drape, MD   500 mg at 03/01/14 0304  . nicotine (NICODERM CQ - dosed in mg/24 hours) patch 21 mg  21 mg Transdermal Daily Waylan Boga, NP   21 mg at 02/28/14 1719  . ondansetron (ZOFRAN-ODT) disintegrating tablet 4 mg  4 mg Oral Q6H PRN Kalman Drape, MD   4 mg at 03/01/14 9122   Current Outpatient Prescriptions  Medication Sig Dispense Refill  . ibuprofen (ADVIL,MOTRIN) 800 MG tablet Take 1 tablet (800 mg total) by mouth 3 (three) times daily. (Patient not taking: Reported on 02/28/2014) 21 tablet 0    Psychiatric Specialty Exam:     Blood pressure 125/79, pulse 73, temperature 98.2 F (36.8 C), temperature source Oral, resp. rate 18, SpO2 99 %.There is no weight on file to calculate BMI.  General Appearance: Casual  Eye Contact::  Good  Speech:  Clear and Coherent and Normal Rate  Volume:  Normal  Mood:  "I feel much better; I'm calm now and I understand what my doctor was saying"  Affect:  Appropriate  Thought Process:  Circumstantial, Coherent and Goal Directed  Orientation:  Full (Time, Place, and Person)  Thought Content:  Rumination  Suicidal Thoughts:  No  Homicidal Thoughts:  No  Memory:  Immediate;   Good Recent;   Good Remote;   Good  Judgement:  Intact  Insight:  Present  Psychomotor Activity:  Normal  Concentration:  Fair  Recall:  Good  Fund of Knowledge:Good  Language: Good  Akathisia:  No  Handed:  Right  AIMS (if indicated):     Assets:  Communication Skills Desire for Improvement Housing Social Support  Sleep:      Musculoskeletal: Strength & Muscle Tone: within normal limits Gait & Station: normal Patient leans: N/A  Treatment Plan Summary: Discharge home.  Patient to follow up with primary care and pain management doctors  Earleen Newport, FNP-BC 03/01/2014 1:06 PM  Patient seen, evaluated and I agree with notes by Nurse Practitioner. Corena Pilgrim, MD

## 2014-04-04 DIAGNOSIS — M79609 Pain in unspecified limb: Secondary | ICD-10-CM | POA: Diagnosis not present

## 2014-04-04 DIAGNOSIS — Z79891 Long term (current) use of opiate analgesic: Secondary | ICD-10-CM | POA: Diagnosis not present

## 2014-04-04 DIAGNOSIS — Z79899 Other long term (current) drug therapy: Secondary | ICD-10-CM | POA: Diagnosis not present

## 2014-04-04 DIAGNOSIS — M545 Low back pain: Secondary | ICD-10-CM | POA: Diagnosis not present

## 2014-04-04 DIAGNOSIS — M542 Cervicalgia: Secondary | ICD-10-CM | POA: Diagnosis not present

## 2014-04-04 DIAGNOSIS — G894 Chronic pain syndrome: Secondary | ICD-10-CM | POA: Diagnosis not present

## 2014-04-26 DIAGNOSIS — M545 Low back pain: Secondary | ICD-10-CM | POA: Diagnosis not present

## 2014-04-26 DIAGNOSIS — M5441 Lumbago with sciatica, right side: Secondary | ICD-10-CM | POA: Diagnosis not present

## 2014-04-26 DIAGNOSIS — G603 Idiopathic progressive neuropathy: Secondary | ICD-10-CM | POA: Diagnosis not present

## 2014-04-26 DIAGNOSIS — G8929 Other chronic pain: Secondary | ICD-10-CM | POA: Diagnosis not present

## 2014-04-26 DIAGNOSIS — M5442 Lumbago with sciatica, left side: Secondary | ICD-10-CM | POA: Diagnosis not present

## 2014-05-10 DIAGNOSIS — M79622 Pain in left upper arm: Secondary | ICD-10-CM | POA: Diagnosis not present

## 2014-05-10 DIAGNOSIS — M79602 Pain in left arm: Secondary | ICD-10-CM | POA: Diagnosis not present

## 2014-05-10 DIAGNOSIS — M79601 Pain in right arm: Secondary | ICD-10-CM | POA: Diagnosis not present

## 2014-05-10 DIAGNOSIS — M542 Cervicalgia: Secondary | ICD-10-CM | POA: Diagnosis not present

## 2014-05-10 DIAGNOSIS — G8929 Other chronic pain: Secondary | ICD-10-CM | POA: Diagnosis not present

## 2014-05-10 DIAGNOSIS — M25512 Pain in left shoulder: Secondary | ICD-10-CM | POA: Diagnosis not present

## 2014-05-10 DIAGNOSIS — M25511 Pain in right shoulder: Secondary | ICD-10-CM | POA: Diagnosis not present

## 2014-05-10 DIAGNOSIS — M79621 Pain in right upper arm: Secondary | ICD-10-CM | POA: Diagnosis not present

## 2014-05-12 ENCOUNTER — Ambulatory Visit (INDEPENDENT_AMBULATORY_CARE_PROVIDER_SITE_OTHER): Payer: Medicare Other | Admitting: Family Medicine

## 2014-05-12 ENCOUNTER — Encounter (HOSPITAL_COMMUNITY): Payer: Self-pay

## 2014-05-12 ENCOUNTER — Emergency Department (HOSPITAL_COMMUNITY): Payer: Medicare Other

## 2014-05-12 ENCOUNTER — Emergency Department (HOSPITAL_COMMUNITY)
Admission: EM | Admit: 2014-05-12 | Discharge: 2014-05-12 | Disposition: A | Discharge status: Home / Self Care | Payer: Medicare Other | Attending: Emergency Medicine | Admitting: Emergency Medicine

## 2014-05-12 VITALS — BP 130/90 | HR 92 | Temp 97.7°F | Resp 16 | Ht 72.0 in | Wt 159.0 lb

## 2014-05-12 DIAGNOSIS — Z8739 Personal history of other diseases of the musculoskeletal system and connective tissue: Secondary | ICD-10-CM | POA: Diagnosis not present

## 2014-05-12 DIAGNOSIS — R072 Precordial pain: Secondary | ICD-10-CM | POA: Diagnosis not present

## 2014-05-12 DIAGNOSIS — R079 Chest pain, unspecified: Secondary | ICD-10-CM | POA: Diagnosis not present

## 2014-05-12 DIAGNOSIS — R51 Headache: Secondary | ICD-10-CM | POA: Insufficient documentation

## 2014-05-12 DIAGNOSIS — Z720 Tobacco use: Secondary | ICD-10-CM | POA: Insufficient documentation

## 2014-05-12 DIAGNOSIS — R519 Headache, unspecified: Secondary | ICD-10-CM

## 2014-05-12 DIAGNOSIS — Z8719 Personal history of other diseases of the digestive system: Secondary | ICD-10-CM | POA: Insufficient documentation

## 2014-05-12 LAB — BASIC METABOLIC PANEL
Anion gap: 4 — ABNORMAL LOW (ref 5–15)
CHLORIDE: 102 mmol/L (ref 96–112)
CO2: 31 mmol/L (ref 19–32)
Calcium: 8.7 mg/dL (ref 8.4–10.5)
Creatinine, Ser: 0.8 mg/dL (ref 0.50–1.35)
GFR calc Af Amer: >90 mL/min (ref >90)
GFR calc non Af Amer: >90 mL/min (ref >90)
GLUCOSE: 95 mg/dL (ref 70–99)
POTASSIUM: 3.6 mmol/L (ref 3.5–5.1)
Sodium: 137 mmol/L (ref 135–145)

## 2014-05-12 LAB — CBC WITH DIFFERENTIAL/PLATELET
Basophils Absolute: 0 10*3/uL (ref 0.0–0.1)
Basophils Relative: 0 % (ref 0–1)
EOS PCT: 1 % (ref 0–5)
Eosinophils Absolute: 0.1 10*3/uL (ref 0.0–0.7)
HCT: 44 % (ref 39.0–52.0)
Hemoglobin: 15.3 g/dL (ref 13.0–17.0)
LYMPHS ABS: 2.4 10*3/uL (ref 0.7–4.0)
Lymphocytes Relative: 31 % (ref 12–46)
MCH: 32.1 pg (ref 26.0–34.0)
MCHC: 34.8 g/dL (ref 30.0–36.0)
MCV: 92.2 fL (ref 78.0–100.0)
MONOS PCT: 8 % (ref 3–12)
Monocytes Absolute: 0.6 10*3/uL (ref 0.1–1.0)
Neutro Abs: 4.7 10*3/uL (ref 1.7–7.7)
Neutrophils Relative %: 60 % (ref 43–77)
PLATELETS: 206 10*3/uL (ref 150–400)
RBC: 4.77 MIL/uL (ref 4.22–5.81)
RDW: 13.2 % (ref 11.5–15.5)
WBC: 7.9 10*3/uL (ref 4.0–10.5)

## 2014-05-12 LAB — I-STAT TROPONIN, ED
TROPONIN I, POC: 0 ng/mL (ref 0.00–0.08)
TROPONIN I, POC: 0 ng/mL (ref 0.00–0.08)

## 2014-05-12 MED ORDER — ONDANSETRON HCL 4 MG PO TABS: 4.0000 mg | ORAL_TABLET | Freq: Four times a day (QID) | ORAL | Status: DC

## 2014-05-12 MED ORDER — HYDROMORPHONE HCL 1 MG/ML IJ SOLN
1.0000 mg | Freq: Once | INTRAMUSCULAR | Status: AC
  Administered 2014-05-12: 1 mg via INTRAVENOUS
  Filled 2014-05-12: qty 1

## 2014-05-12 MED ORDER — MORPHINE SULFATE 4 MG/ML IJ SOLN
4.0000 mg | Freq: Once | INTRAMUSCULAR | Status: AC
  Administered 2014-05-12: 4 mg via INTRAVENOUS
  Filled 2014-05-12: qty 1

## 2014-05-12 MED ORDER — ASPIRIN 81 MG PO CHEW: 324.0000 mg | CHEWABLE_TABLET | Freq: Once | ORAL | Status: DC

## 2014-05-12 MED ORDER — METOCLOPRAMIDE HCL 5 MG/ML IJ SOLN
10.0000 mg | Freq: Once | INTRAMUSCULAR | Status: AC
  Administered 2014-05-12: 10 mg via INTRAVENOUS
  Filled 2014-05-12: qty 2

## 2014-05-12 MED ORDER — DIPHENHYDRAMINE HCL 50 MG/ML IJ SOLN
25.0000 mg | Freq: Once | INTRAMUSCULAR | Status: AC
  Administered 2014-05-12: 25 mg via INTRAVENOUS
  Filled 2014-05-12: qty 1

## 2014-05-12 MED ORDER — ONDANSETRON HCL 4 MG/2ML IJ SOLN
4.0000 mg | Freq: Once | INTRAMUSCULAR | Status: AC
  Administered 2014-05-12: 4 mg via INTRAVENOUS
  Filled 2014-05-12: qty 2

## 2014-05-12 MED ORDER — OXYCODONE-ACETAMINOPHEN 5-325 MG PO TABS: 1.0000 | ORAL_TABLET | Freq: Four times a day (QID) | ORAL | Status: DC | PRN

## 2014-05-12 MED ORDER — OXYCODONE-ACETAMINOPHEN 5-325 MG PO TABS
2.0000 | ORAL_TABLET | Freq: Once | ORAL | Status: AC
  Administered 2014-05-12: 2 via ORAL
  Filled 2014-05-12: qty 2

## 2014-05-12 MED ORDER — SODIUM CHLORIDE 0.9 % IV BOLUS (SEPSIS)
1000.0000 mL | Freq: Once | INTRAVENOUS | Status: AC
  Administered 2014-05-12: 1000 mL via INTRAVENOUS

## 2014-05-12 MED ORDER — ASPIRIN 81 MG PO CHEW
324.0000 mg | CHEWABLE_TABLET | Freq: Once | ORAL | Status: DC
  Filled 2014-05-12: qty 4

## 2014-05-12 NOTE — Discharge Instructions (Signed)

## 2014-05-12 NOTE — Patient Instructions (Signed)
If the emergency room does not evaluate the headache further, you should return here in a few days for further evaluation of it.

## 2014-05-12 NOTE — ED Provider Notes (Signed)
CSN: [id number]     Arrival date & time 05/12/14  1745 History   First MD Initiated Contact with Patient 05/12/14 1746     Chief Complaint  Patient presents with  . Chest Pain  . Headache     (Consider location/radiation/quality/duration/timing/severity/associated sxs/prior Treatment) HPI    PCP: No PCP Per Patient Blood pressure 143/87, pulse 77, temperature 98.9 F (37.2 C), temperature source Oral, resp. rate 12, SpO2 99 %.  [name] Mazurek Jr. is a 39 y.o.male with a significant PMH of hernia, cervical and spinal stenosis  presents to the ER sent from Urgent Care by EMS for evaluation of chest pain. He was seen by Dr. Alwyn Ren for chest pain that started this morning. Substernal without radiation and pressure sensation with nausea but no vomiting. He has intermittent sharp shooting pains as well. + family hx of MI in his parents when they are greater than 72 years old. He admits to having CP in the past and has had EKG and "work-up" but it is always negative. The pain has been constant since he woke up this morning and has not yet improved.  He also has had headache for 1.5 months constant without relief. It is mainly a pressure behind his left eye associated with photophobia, it waxed and wanes, it is without radiation. Currently the pain is 7/10 and he has had no relief with OTC medications.  Negative Review of Symptoms: neck pain different from baseline, fevers, myalgias, abdominal pains, shortness of breath, wheezing, confusion, dizziness, ataxia, facial droop, weakness, change in vision, cough, history of GERD.    Past Medical History  Diagnosis Date  . Hernia     Bilateral Inguinal  . Cervical stenosis of spinal canal    Past Surgical History  Procedure Laterality Date  . Inguinal hernia repair    . Thumb surgery     Family History  Problem Relation Age of Onset  . Hypertension Mother   . Heart disease Father   . Crohn's disease Maternal Aunt   . Colon cancer Paternal  Grandfather    History  Substance Use Topics  . Smoking status: Current Every Day Smoker -- 1.00 packs/day for 25 years  . Smokeless tobacco: Never Used  . Alcohol Use: No    Review of Systems  10 Systems reviewed and are negative for acute change except as noted in the HPI.     Allergies  Iohexol  Home Medications   Prior to Admission medications   Medication Sig Start Date End Date Taking? Authorizing Provider  ibuprofen (ADVIL,MOTRIN) 800 MG tablet Take 1 tablet (800 mg total) by mouth 3 (three) times daily. Patient not taking: Reported on 02/28/2014 01/27/14   Doug Sou, MD   BP 115/67 mmHg  Pulse 69  Temp(Src) 98.9 F (37.2 C) (Oral)  Resp 12  SpO2 98% Physical Exam  Constitutional: He appears well-developed and well-nourished. No distress.  HENT:  Head: Normocephalic and atraumatic.  Eyes: Pupils are equal, round, and reactive to light.  Neck: Normal range of motion. Neck supple.  Cardiovascular: Normal rate and regular rhythm.   Pulmonary/Chest: Effort normal. He has no wheezes. He has no rhonchi. He exhibits no tenderness and no bony tenderness.  Abdominal: Soft.  Neurological: He is alert.  Cranial nerves II-VIII and X-XII evaluated and show no deficits. Pt alert and oriented x 3 Upper and lower extremity strength is symmetrical and physiologic Normal muscular tone No facial droop Coordination intact, no limb ataxia  Skin: Skin  is warm and dry.  Nursing note and vitals reviewed.   ED Course  Procedures (including critical care time) Labs Review Labs Reviewed  BASIC METABOLIC PANEL - Abnormal; Notable for the following:    BUN <5 (*)    Anion gap 4 (*)    All other components within normal limits  CBC WITH DIFFERENTIAL/PLATELET  Rosezena SensorI-STAT TROPOININ, ED  Rosezena SensorI-STAT TROPOININ, ED    Imaging Review Dg Chest 2 View  05/12/2014   CLINICAL DATA:  Acute chest pain for 1 day.  EXAM: CHEST  2 VIEW  COMPARISON:  January 05, 2014.  FINDINGS: The heart size  and mediastinal contours are within normal limits. Both lungs are clear. No pneumothorax or pleural effusion is noted. The visualized skeletal structures are unremarkable.  IMPRESSION: No acute cardiopulmonary abnormality seen.   Electronically Signed   By: Lupita RaiderJames  Green Jr, M.D.   On: 05/12/2014 19:45   Ct Head Wo Contrast  05/12/2014   CLINICAL DATA:  Headaches for 1-1/2 months  EXAM: CT HEAD WITHOUT CONTRAST  TECHNIQUE: Contiguous axial images were obtained from the base of the skull through the vertex without intravenous contrast.  COMPARISON:  01/27/2014  FINDINGS: The bony calvarium is intact. No gross soft tissue abnormality is noted. No findings to suggest acute hemorrhage, acute infarction or space-occupying mass lesion are noted.  IMPRESSION: No acute abnormality noted.   Electronically Signed   By: Alcide CleverMark  Lukens M.D.   On: 05/12/2014 19:28     EKG Interpretation   Date/Time:  Thursday May 12 2014 18:02:19 EST Ventricular Rate:  72 PR Interval:  178 QRS Duration: 114 QT Interval:  396 QTC Calculation: 433 R Axis:   46 Text Interpretation:  Sinus rhythm Incomplete right bundle branch block No  significant change since last tracing Confirmed by PICKERING  MD, Harrold DonathNATHAN  [phone]([id number]) on 05/12/2014 6:42:00 PM      MDM   Final diagnoses:  Chest pain  Headache    The patient has had two negative Troponins and normal cardiac work-up. The pain has been constant for 12 hours now and is substernal without radiation. I do not believe that this is cardiac but will give a referral to cardiology.  Medications  aspirin chewable tablet 324 mg (324 mg Oral Not Given 05/12/14 1910)  sodium chloride 0.9 % bolus 1,000 mL (0 mLs Intravenous Stopped 05/12/14 2053)  diphenhydrAMINE (BENADRYL) injection 25 mg (25 mg Intravenous Given 05/12/14 2008)  metoCLOPramide (REGLAN) injection 10 mg (10 mg Intravenous Given 05/12/14 2007)  morphine 4 MG/ML injection 4 mg (4 mg Intravenous Given 05/12/14 2007)  HYDROmorphone  (DILAUDID) injection 1 mg (1 mg Intravenous Given 05/12/14 2118)  oxyCODONE-acetaminophen (PERCOCET/ROXICET) 5-325 MG per tablet 2 tablet (2 tablets Oral Given 05/12/14 2240)  ondansetron (ZOFRAN) injection 4 mg (4 mg Intravenous Given 05/12/14 2240)    The patients headache was difficult to control. He has been on 10 mg Percocet QID for a long time due to chronic neck pain. His tolerance level is high. The Head CT has come back negative. His pain has been managed and the headache is mostly resolved.  Presentation is non concerning for [facility], ICH, Meningitis, or temporal arteritis. Pt is afebrile with no focal neuro deficits, nuchal rigidity, or change in vision. The patient denies any symptoms of neurological impairment or TIA's; no amaurosis, diplopia, dysphasia, or unilateral disturbance of motor or sensory function. No loss of balance or vertigo.  The patient has been given neurology follow-up and is urged to  make an appointment for further evaluation.  39 y.o.[name] Eisner Jr.'s evaluation in the Emergency Department is complete. It has been determined that no acute conditions requiring further emergency intervention are present at this time. The patient/guardian have been advised of the diagnosis and plan. We have discussed signs and symptoms that warrant return to the ED, such as changes or worsening in symptoms.  Vital signs are stable at discharge. Filed Vitals:   05/12/14 2115  BP: 115/67  Pulse: 69  Temp:   Resp: 12    Patient/guardian has voiced understanding and agreed to follow-up with the PCP or specialist.     Dorthula Matas, PA-C 05/12/14 2300  Juliet Rude. Rubin Payor, MD 05/13/14 1610

## 2014-05-12 NOTE — Progress Notes (Signed)
Subjective: 39 year old man who presents with history of chest pain today. It's a heavy pressure feeling in the substernal region with no radiation. He has had some nausea but no vomiting. No shortness of breath. He has been having a very severe headache for the last month. It hurts behind his left eye. He can't get relief from it. He is not on any regular medication except ibuprofen. He does have a long list of multiple problems, but he is primarily disabled because of neck issues. He smokes one half pack cigarettes a day. No history of coronary artery disease. He denies any illicit drug use. The headache is under extreme pressure. Is not been worked up. He has not had any imaging studies.  Objective: O2 sat 97% Wearing sunglasses. No shortness of breath. Neck is very painful and not supple due to old cervical disease. Chest is clear to auscultation. Heart regular without murmurs gallops or arrhythmias. Abdomen soft and nontender. Cranial nerves grossly intact. Fundi appear benign. His finger to nose is normal. Grip decrease in the left hand. He is unsteady with a mildly abnormal Romberg.  EKG: Mildly widened QRS, ST segment changes. No arrhythmias.  Assessment: Pressure chest pain Nausea Atypical headache Tobacco use disorder History of cervical disc disease and chronic neck pain Anxiety  Plan: Patient needs enzyme evaluation and will be sent to the emergency room.  The headache is unusual in its severity and constant symptom. He probably warrants imaging, but the chest pain is the priority issue. After it is clarified as to whether or not his heart is stable, the emergency room physician can decide on further evaluation.  IV, monitor  EMS notified.

## 2014-05-12 NOTE — ED Notes (Signed)
Per EMS, patient from urgent care. Went to urgent care for HA that he has had x1.5 months. States it has progressively gotten worse and in the past week or two has started to have pressure to left eye. States intermittent blurred vision. Pt. Also woke up with CP this AM. Reports pain as a centralized dull constant pain, reports nausea. Denies SOB.

## 2014-07-04 ENCOUNTER — Emergency Department (HOSPITAL_COMMUNITY): Payer: Medicare Other

## 2014-07-04 ENCOUNTER — Emergency Department (HOSPITAL_COMMUNITY)
Admission: EM | Admit: 2014-07-04 | Discharge: 2014-07-04 | Disposition: A | Discharge status: Home / Self Care | Payer: Medicare Other | Attending: Emergency Medicine | Admitting: Emergency Medicine

## 2014-07-04 ENCOUNTER — Encounter (HOSPITAL_COMMUNITY): Payer: Self-pay | Admitting: Emergency Medicine

## 2014-07-04 DIAGNOSIS — R55 Syncope and collapse: Secondary | ICD-10-CM | POA: Diagnosis not present

## 2014-07-04 DIAGNOSIS — G8929 Other chronic pain: Secondary | ICD-10-CM | POA: Insufficient documentation

## 2014-07-04 DIAGNOSIS — M549 Dorsalgia, unspecified: Secondary | ICD-10-CM | POA: Diagnosis not present

## 2014-07-04 DIAGNOSIS — W19XXXA Unspecified fall, initial encounter: Secondary | ICD-10-CM

## 2014-07-04 DIAGNOSIS — S199XXA Unspecified injury of neck, initial encounter: Secondary | ICD-10-CM | POA: Diagnosis not present

## 2014-07-04 DIAGNOSIS — M542 Cervicalgia: Secondary | ICD-10-CM | POA: Diagnosis not present

## 2014-07-04 DIAGNOSIS — Z720 Tobacco use: Secondary | ICD-10-CM | POA: Insufficient documentation

## 2014-07-04 DIAGNOSIS — W01198A Fall on same level from slipping, tripping and stumbling with subsequent striking against other object, initial encounter: Secondary | ICD-10-CM | POA: Insufficient documentation

## 2014-07-04 DIAGNOSIS — M546 Pain in thoracic spine: Secondary | ICD-10-CM | POA: Diagnosis not present

## 2014-07-04 DIAGNOSIS — Y92008 Other place in unspecified non-institutional (private) residence as the place of occurrence of the external cause: Secondary | ICD-10-CM | POA: Diagnosis not present

## 2014-07-04 DIAGNOSIS — Y9389 Activity, other specified: Secondary | ICD-10-CM | POA: Diagnosis not present

## 2014-07-04 DIAGNOSIS — R05 Cough: Secondary | ICD-10-CM | POA: Diagnosis not present

## 2014-07-04 DIAGNOSIS — Y998 Other external cause status: Secondary | ICD-10-CM | POA: Diagnosis not present

## 2014-07-04 DIAGNOSIS — Z8719 Personal history of other diseases of the digestive system: Secondary | ICD-10-CM | POA: Insufficient documentation

## 2014-07-04 DIAGNOSIS — S4991XA Unspecified injury of right shoulder and upper arm, initial encounter: Secondary | ICD-10-CM | POA: Insufficient documentation

## 2014-07-04 DIAGNOSIS — Z8739 Personal history of other diseases of the musculoskeletal system and connective tissue: Secondary | ICD-10-CM | POA: Diagnosis not present

## 2014-07-04 DIAGNOSIS — R9431 Abnormal electrocardiogram [ECG] [EKG]: Secondary | ICD-10-CM | POA: Diagnosis not present

## 2014-07-04 DIAGNOSIS — R079 Chest pain, unspecified: Secondary | ICD-10-CM | POA: Diagnosis not present

## 2014-07-04 DIAGNOSIS — Z791 Long term (current) use of non-steroidal anti-inflammatories (NSAID): Secondary | ICD-10-CM | POA: Insufficient documentation

## 2014-07-04 DIAGNOSIS — M25511 Pain in right shoulder: Secondary | ICD-10-CM | POA: Diagnosis not present

## 2014-07-04 DIAGNOSIS — R054 Cough syncope: Secondary | ICD-10-CM

## 2014-07-04 DIAGNOSIS — S0990XA Unspecified injury of head, initial encounter: Secondary | ICD-10-CM | POA: Diagnosis not present

## 2014-07-04 DIAGNOSIS — R42 Dizziness and giddiness: Secondary | ICD-10-CM | POA: Diagnosis not present

## 2014-07-04 LAB — BASIC METABOLIC PANEL
Anion gap: 8 (ref 5–15)
BUN: 6 mg/dL (ref 6–23)
CALCIUM: 8.8 mg/dL (ref 8.4–10.5)
CHLORIDE: 109 mmol/L (ref 96–112)
CO2: 26 mmol/L (ref 19–32)
Creatinine, Ser: 0.79 mg/dL (ref 0.50–1.35)
GFR calc non Af Amer: >90 mL/min (ref >90)
GLUCOSE: 91 mg/dL (ref 70–99)
Potassium: 3.7 mmol/L (ref 3.5–5.1)
Sodium: 143 mmol/L (ref 135–145)

## 2014-07-04 LAB — CBC WITH DIFFERENTIAL/PLATELET
Basophils Absolute: 0 10*3/uL (ref 0.0–0.1)
Basophils Relative: 0 % (ref 0–1)
EOS ABS: 0.1 10*3/uL (ref 0.0–0.7)
Eosinophils Relative: 2 % (ref 0–5)
HCT: 43.2 % (ref 39.0–52.0)
HEMOGLOBIN: 14.5 g/dL (ref 13.0–17.0)
LYMPHS PCT: 31 % (ref 12–46)
Lymphs Abs: 2.9 10*3/uL (ref 0.7–4.0)
MCH: 32 pg (ref 26.0–34.0)
MCHC: 33.6 g/dL (ref 30.0–36.0)
MCV: 95.4 fL (ref 78.0–100.0)
MONO ABS: 0.6 10*3/uL (ref 0.1–1.0)
Monocytes Relative: 7 % (ref 3–12)
NEUTROS ABS: 5.7 10*3/uL (ref 1.7–7.7)
Neutrophils Relative %: 60 % (ref 43–77)
Platelets: 182 10*3/uL (ref 150–400)
RBC: 4.53 MIL/uL (ref 4.22–5.81)
RDW: 14.1 % (ref 11.5–15.5)
WBC: 9.3 10*3/uL (ref 4.0–10.5)

## 2014-07-04 LAB — D-DIMER, QUANTITATIVE (NOT AT ARMC)

## 2014-07-04 LAB — TROPONIN I: Troponin I: <0.03 ng/mL (ref <0.031)

## 2014-07-04 MED ORDER — METHOCARBAMOL 500 MG PO TABS: 500.0000 mg | ORAL_TABLET | Freq: Four times a day (QID) | ORAL | Status: DC | PRN

## 2014-07-04 MED ORDER — METHOCARBAMOL 500 MG PO TABS
1000.0000 mg | ORAL_TABLET | Freq: Once | ORAL | Status: AC
  Administered 2014-07-04: 1000 mg via ORAL
  Filled 2014-07-04: qty 2

## 2014-07-04 MED ORDER — KETOROLAC TROMETHAMINE 30 MG/ML IJ SOLN
30.0000 mg | Freq: Once | INTRAMUSCULAR | Status: AC
  Administered 2014-07-04: 30 mg via INTRAVENOUS
  Filled 2014-07-04: qty 1

## 2014-07-04 MED ORDER — IBUPROFEN 800 MG PO TABS: 800.0000 mg | ORAL_TABLET | Freq: Three times a day (TID) | ORAL | Status: DC | PRN

## 2014-07-04 NOTE — ED Notes (Signed)
Per EMS: pt c/o chronic back pain and dizziness, today woke up and didn't feel well. States went to stand up and just remembers being on the floor. Pt states he was breathing fast then hit the floor. No increase pain since fall. Pt also c/o right sided chest pain non radiating, pain upon palpation and coughing. 20 g in left hand.

## 2014-07-04 NOTE — ED Notes (Signed)
Bed: WA01 Expected date:  Expected time:  Means of arrival:  Comments: EMS-back pain/dizzy

## 2014-07-04 NOTE — ED Provider Notes (Signed)
CSN: [id number]     Arrival date & time 07/04/14  1235 History   First MD Initiated Contact with Patient 07/04/14 1307     Chief Complaint  Patient presents with  . Back Pain  . Dizziness     (Consider location/radiation/quality/duration/timing/severity/associated sxs/prior Treatment) HPI   Pt with chronic neck pain and frequent migraine headaches presents with headache, neck, right upper back, right shoulder pain following syncopal episode today.  States he has had central chest pain x 1 month with cough, occasional SOB.  Pain is tight and occasional sharp.  The sharpness is random but sometimes is worse with deep inspiration.  Today he was coughing and coughing and then woke up on the floor.  Family member found him on the floor in a disoriented state, similar to prior post-ictal states.  Pt had taken a non-prescribed Percocet 10 and Xanax  this morning around 6am- states he takes these 4-5 times daily for his chronic pain.  He was previously in pain management but now has no pain management and no PCP.  Denies fevers, chills, URI symptoms, abdominal pain, N/V?D, urinary symptoms.  Pt admits to depression.  Denies SI.    Pt has hx seizures but this was unlike prior seizures because after his seizures he knew exactly what had happened. 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  States he has not had a gap in his narcotic and benzo use.  Denies alcohol use.    Past Medical History  Diagnosis Date  . Hernia     Bilateral Inguinal  . Cervical stenosis of spinal canal    Past Surgical History  Procedure Laterality Date  . Inguinal hernia repair    . Thumb surgery     Family History  Problem Relation Age of Onset  . Hypertension Mother   . Heart disease Father   . Crohn's disease Maternal Aunt   . Colon cancer Paternal Grandfather    History  Substance Use Topics  . Smoking status: Current Every Day Smoker -- 1.00 packs/day for 25 years  . Smokeless tobacco: Never Used  . Alcohol  Use: No    Review of Systems  All other systems reviewed and are negative.     Allergies  Iohexol  Home Medications   Prior to Admission medications   Medication Sig Start Date End Date Taking? Authorizing Provider  ALPRAZolam Prudy Feeler) 1 MG tablet Take 1 mg by mouth daily as needed for anxiety.   Yes Historical Provider, MD  oxyCODONE-acetaminophen (PERCOCET) 10-325 MG per tablet Take 1 tablet by mouth every 4 (four) hours as needed for pain.   Yes Historical Provider, MD  ibuprofen (ADVIL,MOTRIN) 800 MG tablet Take 1 tablet (800 mg total) by mouth 3 (three) times daily. Patient not taking: Reported on 02/28/2014 01/27/14   Doug Sou, MD  ondansetron (ZOFRAN) 4 MG tablet Take 1 tablet (4 mg total) by mouth every 6 (six) hours. Patient not taking: Reported on 07/04/2014 05/12/14   Marlon Pel, PA-C  oxyCODONE-acetaminophen (PERCOCET/ROXICET) 5-325 MG per tablet Take 1-2 tablets by mouth every 6 (six) hours as needed for severe pain. Patient not taking: Reported on 07/04/2014 05/12/14   Marlon Pel, PA-C   BP 133/85 mmHg  Pulse 62  Temp(Src) 98.4 F (36.9 C) (Oral)  Resp 18  Ht 6' (1.829 m)  Wt 170 lb (77.111 kg)  BMI 23.05 kg/m2  SpO2 99% Physical Exam  Constitutional: He appears well-developed and well-nourished. No distress.  Alert but appears slowed vs medicated  HENT:  Head: Normocephalic and atraumatic.  Neck: Neck supple.  Cardiovascular: Normal rate and regular rhythm.   Pulmonary/Chest: Effort normal and breath sounds normal. No respiratory distress. He has no wheezes. He has no rales.  Abdominal: Soft. He exhibits no distension and no mass. There is no tenderness. There is no rebound and no guarding.  Neurological: He is alert. He exhibits normal muscle tone.  Skin: He is not diaphoretic.  Nursing note and vitals reviewed.   ED Course  Procedures (including critical care time) Labs Review Labs Reviewed  BASIC METABOLIC PANEL  CBC WITH  DIFFERENTIAL/PLATELET  D-DIMER, QUANTITATIVE  TROPONIN I    Imaging Review Dg Chest 2 View  07/04/2014   CLINICAL DATA:  Back pain. Dizziness. Chronic back pain with dizziness today. Onset of symptoms today.  EXAM: CHEST  2 VIEW  COMPARISON:  05/12/2014.  FINDINGS: Cardiopericardial silhouette within normal limits. Mediastinal contours normal. Trachea midline. No airspace disease or effusion. Monitoring leads project over the chest.  IMPRESSION: No active cardiopulmonary disease.   Electronically Signed   By: Andreas NewportGeoffrey  Lamke M.D.   On: 07/04/2014 14:29   Dg Shoulder Right  07/04/2014   CLINICAL DATA:  Pain following fall  EXAM: RIGHT SHOULDER - 2+ VIEW  COMPARISON:  April 09, 2009  FINDINGS: Frontal, Y scapular, and axillary images obtained. No fracture or dislocation. Joint spaces appear intact. No erosive change or intra-articular calcification.  IMPRESSION: No fracture or dislocation.  No appreciable arthropathy.   Electronically Signed   By: Bretta BangWilliam  Woodruff III M.D.   On: 07/04/2014 14:28   Ct Head Wo Contrast  07/04/2014   CLINICAL DATA:  Dizziness, syncope, head injury.  Back pain.  EXAM: CT HEAD WITHOUT CONTRAST  CT CERVICAL SPINE WITHOUT CONTRAST  TECHNIQUE: Multidetector CT imaging of the head and cervical spine was performed following the standard protocol without intravenous contrast. Multiplanar CT image reconstructions of the cervical spine were also generated.  COMPARISON:  05/12/2014 head CT  FINDINGS: CT HEAD FINDINGS  Skull and Sinuses:Negative for fracture or destructive process. The mastoids, middle ears, and imaged paranasal sinuses are clear.  Orbits: No acute abnormality.  Brain: No evidence of acute infarction, hemorrhage, hydrocephalus, or mass lesion/mass effect.  CT CERVICAL SPINE FINDINGS  Negative for acute fracture or subluxation. No prevertebral edema. No gross cervical canal hematoma. No significant osseous canal or foraminal stenosis.  IMPRESSION: Negative.  No  evidence of intracranial or cervical spine injury.   Electronically Signed   By: Marnee SpringJonathon  Watts M.D.   On: 07/04/2014 14:12   Ct Cervical Spine Wo Contrast  07/04/2014   CLINICAL DATA:  Dizziness, syncope, head injury.  Back pain.  EXAM: CT HEAD WITHOUT CONTRAST  CT CERVICAL SPINE WITHOUT CONTRAST  TECHNIQUE: Multidetector CT imaging of the head and cervical spine was performed following the standard protocol without intravenous contrast. Multiplanar CT image reconstructions of the cervical spine were also generated.  COMPARISON:  05/12/2014 head CT  FINDINGS: CT HEAD FINDINGS  Skull and Sinuses:Negative for fracture or destructive process. The mastoids, middle ears, and imaged paranasal sinuses are clear.  Orbits: No acute abnormality.  Brain: No evidence of acute infarction, hemorrhage, hydrocephalus, or mass lesion/mass effect.  CT CERVICAL SPINE FINDINGS  Negative for acute fracture or subluxation. No prevertebral edema. No gross cervical canal hematoma. No significant osseous canal or foraminal stenosis.  IMPRESSION: Negative.  No evidence of intracranial or cervical spine injury.   Electronically Signed   By: Kathrynn DuckingJonathon  Watts M.D.  On: 07/04/2014 14:12     EKG Interpretation   Date/Time:  Monday July 04 2014 12:40:31 EDT Ventricular Rate:  62 PR Interval:  185 QRS Duration: 109 QT Interval:  410 QTC Calculation: 416 R Axis:   14 Text Interpretation:  Sinus or ectopic atrial rhythm Abnormal R-wave  progression, early transition No significant change since last tracing  Confirmed by KNAPP  MD-J, JON ([id number]) on 07/04/2014 3:55:16 PM        MDM   Final diagnoses:  Fall  Right shoulder pain  Chronic neck pain  Syncope, tussive    Afebrile, nontoxic patient with chronic pain, cough x 1 month with post-tussive syncope this morning.   Lungs CTAB.  CXR negative.  Right shoulder xray negative.  Head CT and c-spine also negative.  Labs unremarkable including troponin, d-dimer.  EKG  nonischemic, no events on monitor.   Takes naroctics and benzos multiple times daily that are not prescribed to him.  Has Medicare but denies having primary care or anyone who can manage his chronic pain.  I have asked case management to speak with him and will give him multiple resources.   D/C home with ibuprofen, robaxin, PCP resources for follow up.  Discussed result, findings, treatment, and follow up  with patient.  Pt given return precautions.  Pt verbalizes understanding and agrees with plan.         Trixie Dredge, PA-C 07/04/14 1559  Linwood Dibbles, MD 07/04/14 [phone]

## 2014-07-04 NOTE — ED Notes (Signed)
Patient is in xray, unable to get labs at this time.

## 2014-07-04 NOTE — ED Notes (Addendum)
324 mg of aspirin given by mother. Pt states he took last percocet this morning.

## 2014-07-04 NOTE — Discharge Instructions (Signed)
Read the information below.  Use the prescribed medication as directed.  Please discuss all new medications with your pharmacist.  You may return to the Emergency Department at any time for worsening condition or any new symptoms that concern you.      Chronic Pain Discharge Instructions  Emergency care providers appreciate that many patients coming to us are in severe pain and we wish to address their pain in the safest, most responsible manner.  It is important to recognize however, that the proper treatment of chronic pain differs from that of the pain of injuries and acute illnesses.  Our goal is to provide quality, safe, personalized care and we thank you for giving us the opportunity to serve you. The use of narcotics and related agents for chronic pain syndromes may lead to additional physical and psychological problems.  Nearly as many people die from prescription narcotics each year as die from car crashes.  Additionally, this risk is increased if such prescriptions are obtained from a variety of sources.  Therefore, only your primary care physician or a pain management specialist is able to safely treat such syndromes with narcotic medications long-term.    Documentation revealing such prescriptions have been sought from multiple sources may prohibit us from providing a refill or different narcotic medication.  Your name may be checked first through the [facility] Controlled Substances Reporting System.  This database is a record of controlled substance medication prescriptions that the patient has received.  This has been established by [facility] in an effort to eliminate the dangerous, and often life threatening, practice of obtaining multiple prescriptions from different medical providers.   If you have a chronic pain syndrome (i.e. chronic headaches, recurrent back or neck pain, dental pain, abdominal or pelvis pain without a specific diagnosis, or neuropathic pain such as  fibromyalgia) or recurrent visits for the same condition without an acute diagnosis, you may be treated with non-narcotics and other non-addictive medicines.  Allergic reactions or negative side effects that may be reported by a patient to such medications will not typically lead to the use of a narcotic analgesic or other controlled substance as an alternative.   Patients managing chronic pain with a personal physician should have provisions in place for breakthrough pain.  If you are in crisis, you should call your physician.  If your physician directs you to the emergency department, please have the doctor call and speak to our attending physician concerning your care.   When patients come to the Emergency Department (ED) with acute medical conditions in which the Emergency Department physician feels appropriate to prescribe narcotic or sedating pain medication, the physician will prescribe these in very limited quantities.  The amount of these medications will last only until you can see your primary care physician in his/her office.  Any patient who returns to the ED seeking refills should expect only non-narcotic pain medications.   In the event of an acute medical condition exists and the emergency physician feels it is necessary that the patient be given a narcotic or sedating medication -  a responsible adult driver should be present in the room prior to the medication being given by the nurse.   Prescriptions for narcotic or sedating medications that have been lost, stolen or expired will not be refilled in the Emergency Department.    Patients who have chronic pain may receive non-narcotic prescriptions until seen by their primary care physician.  It is every patients personal responsibility to maintain  active prescriptions with his or her primary care physician or specialist.   Syncope Syncope is a medical term for fainting or passing out. This means you lose consciousness and drop to the  ground. People are generally unconscious for less than 5 minutes. You may have some muscle twitches for up to 15 seconds before waking up and returning to normal. Syncope occurs more often in older adults, but it can happen to anyone. While most causes of syncope are not dangerous, syncope can be a sign of a serious medical problem. It is important to seek medical care.  CAUSES  Syncope is caused by a sudden drop in blood flow to the brain. The specific cause is often not determined. Factors that can bring on syncope include:  Taking medicines that lower blood pressure.  Sudden changes in posture, such as standing up quickly.  Taking more medicine than prescribed.  Standing in one place for too long.  Seizure disorders.  Dehydration and excessive exposure to heat.  Low blood sugar (hypoglycemia).  Straining to have a bowel movement.  Heart disease, irregular heartbeat, or other circulatory problems.  Fear, emotional distress, seeing blood, or severe pain. SYMPTOMS  Right before fainting, you may:  Feel dizzy or light-headed.  Feel nauseous.  See all white or all black in your field of vision.  Have cold, clammy skin. DIAGNOSIS  Your health care provider will ask about your symptoms, perform a physical exam, and perform an electrocardiogram (ECG) to record the electrical activity of your heart. Your health care provider may also perform other heart or blood tests to determine the cause of your syncope which may include:  Transthoracic echocardiogram (TTE). During echocardiography, sound waves are used to evaluate how blood flows through your heart.  Transesophageal echocardiogram (TEE).  Cardiac monitoring. This allows your health care provider to monitor your heart rate and rhythm in real time.  Holter monitor. This is a portable device that records your heartbeat and can help diagnose heart arrhythmias. It allows your health care provider to track your heart activity for  several days, if needed.  Stress tests by exercise or by giving medicine that makes the heart beat faster. TREATMENT  In most cases, no treatment is needed. Depending on the cause of your syncope, your health care provider may recommend changing or stopping some of your medicines. HOME CARE INSTRUCTIONS  Have someone stay with you until you feel stable.  Do not drive, use machinery, or play sports until your health care provider says it is okay.  Keep all follow-up appointments as directed by your health care provider.  Lie down right away if you start feeling like you might faint. Breathe deeply and steadily. Wait until all the symptoms have passed.  Drink enough fluids to keep your urine clear or pale yellow.  If you are taking blood pressure or heart medicine, get up slowly and take several minutes to sit and then stand. This can reduce dizziness. SEEK IMMEDIATE MEDICAL CARE IF:   You have a severe headache.  You have unusual pain in the chest, abdomen, or back.  You are bleeding from your mouth or rectum, or you have black or tarry stool.  You have an irregular or very fast heartbeat.  You have pain with breathing.  You have repeated fainting or seizure-like jerking during an episode.  You faint when sitting or lying down.  You have confusion.  You have trouble walking.  You have severe weakness.  You have vision problems.  If you fainted, call your local emergency services (911 in [place] Do not drive yourself to the hospital.  MAKE SURE YOU:  Understand these instructions.  Will watch your condition.  Will get help right away if you are not doing well or get worse. Document Released: 02/25/2005 Document Revised: 03/02/2013 Document Reviewed: 04/26/2011 [facility] Patient Information 2015 [place], [place]. This information is not intended to replace advice given to you by your health care provider. Make sure you discuss any questions you have with your health care  provider.  Chronic Pain Chronic pain can be defined as pain that is off and on and lasts for 3-6 months or longer. Many things cause chronic pain, which can make it difficult to make a diagnosis. There are many treatment options available for chronic pain. However, finding a treatment that works well for you may require trying various approaches until the right one is found. Many people benefit from a combination of two or more types of treatment to control their pain. SYMPTOMS  Chronic pain can occur anywhere in the body and can range from mild to very severe. Some types of chronic pain include:  Headache.  Low back pain.  Cancer pain.  Arthritis pain.  Neurogenic pain. This is pain resulting from damage to nerves. People with chronic pain may also have other symptoms such as:  Depression.  Anger.  Insomnia.  Anxiety. DIAGNOSIS  Your health care provider will help diagnose your condition over time. In many cases, the initial focus will be on excluding possible conditions that could be causing the pain. Depending on your symptoms, your health care provider may order tests to diagnose your condition. Some of these tests may include:   Blood tests.   CT scan.   MRI.   X-rays.   Ultrasounds.   Nerve conduction studies.  You may need to see a specialist.  TREATMENT  Finding treatment that works well may take time. You may be referred to a pain specialist. He or she may prescribe medicine or therapies, such as:   Mindful meditation or yoga.  Shots (injections) of numbing or pain-relieving medicines into the spine or area of pain.  Local electrical stimulation.  Acupuncture.   Massage therapy.   Aroma, color, light, or sound therapy.   Biofeedback.   Working with a physical therapist to keep from getting stiff.   Regular, gentle exercise.   Cognitive or behavioral therapy.   Group support.  Sometimes, surgery may be recommended.  HOME CARE  INSTRUCTIONS   Take all medicines as directed by your health care provider.   Lessen stress in your life by relaxing and doing things such as listening to calming music.   Exercise or be active as directed by your health care provider.   Eat a healthy diet and include things such as vegetables, fruits, fish, and lean meats in your diet.   Keep all follow-up appointments with your health care provider.   Attend a support group with others suffering from chronic pain. SEEK MEDICAL CARE IF:   Your pain gets worse.   You develop a new pain that was not there before.   You cannot tolerate medicines given to you by your health care provider.   You have new symptoms since your last visit with your health care provider.  SEEK IMMEDIATE MEDICAL CARE IF:   You feel weak.   You have decreased sensation or numbness.   You lose control of bowel or bladder function.   Your pain  suddenly gets much worse.   You develop shaking.  You develop chills.  You develop confusion.  You develop chest pain.  You develop shortness of breath.  MAKE SURE YOU:  Understand these instructions.  Will watch your condition.  Will get help right away if you are not doing well or get worse. Document Released: 11/17/2001 Document Revised: 10/28/2012 Document Reviewed: 08/21/2012 [facility] Patient Information 2015 [place], [place]. This information is not intended to replace advice given to you by your health care provider. Make sure you discuss any questions you have with your health care provider.   Emergency Department Resource Guide 1) Find a Doctor and Pay Out of Pocket Although you won't have to find out who is covered by your insurance plan, it is a good idea to ask around and get recommendations. You will then need to call the office and see if the doctor you have chosen will accept you as a new patient and what types of options they offer for patients who are self-pay. Some doctors  offer discounts or will set up payment plans for their patients who do not have insurance, but you will need to ask so you aren't surprised when you get to your appointment.  2) Contact Your Local Health Department Not all health departments have doctors that can see patients for sick visits, but many do, so it is worth a call to see if yours does. If you don't know where your local health department is, you can check in your phone book. The CDC also has a tool to help you locate your state's health department, and many state websites also have listings of all of their local health departments.  3) Find a Walk-in Clinic If your illness is not likely to be very severe or complicated, you may want to try a walk in clinic. These are popping up all over the country in pharmacies, drugstores, and shopping centers. They're usually staffed by nurse practitioners or physician assistants that have been trained to treat common illnesses and complaints. They're usually fairly quick and inexpensive. However, if you have serious medical issues or chronic medical problems, these are probably not your best option.  No Primary Care Doctor: - Call Health Connect at  [phone] - they can help you locate a primary care doctor that  accepts your insurance, provides certain services, etc. - Physician Referral Service- [phone]  Chronic Pain Problems: Organization         Address  Phone   Notes  [facility]  [phone] Patients need to be referred by their primary care doctor.   Medication Assistance: Organization         Address  Phone   Notes  [facility] Medication [facility] [address] [place] [place], [place] [address] [phone] --Must be a resident of [facility] -- Must have NO insurance coverage whatsoever (no Medicaid/ Medicare, etc.) -- The pt. MUST have a primary care doctor that directs their care regularly and follows them in the community    [facility]ist  [phone]   [employer]  [phone]    Agencies that provide inexpensive medical care: Organization         Address  Phone   Notes  [facility]  [phone]   [facility]    [phone]   [facility] [address] [place], [place] [address] [phone]   [facility]  Lovenia Shuck, [place] [phone]   Planned Parenthood    [phone]   [facility]    [phone]   [facility]  [address], [place] Phone:  [phone], Fax:  [phone] Hours of Operation:  9 am - 6 pm, M-F.  Also accepts Medicaid/Medicare and self-pay.  [facility]  [address], [place] Phone: [phone], Fax: [phone]. Hours of Operation:  8:30 am - 5:30 pm, M-F.  Also accepts Medicaid and self-pay.  [facility] [place] [address] [place] Phone: [phone]   [facility] [address] Natasha Bence [place], [place] [phone], Ext. [phone] Mondays & Thursdays: 7-9 AM.  First 15 patients are seen on a first come, first serve basis.    Medicaid-accepting [facility] Providers:  Organization         Address  Phone   Notes  [facility] [address], [employer] [phone] Also accepts self-pay patients.  [facility] [address] Laurell Josephs [place], [place]  [phone]   [facility] [address], [place] [phone]   [facility] [address], [place] [phone]   Renaye Rakers [address], [place]   [phone] Only accepts [place] patients after they have their name applied to their card.   Self-Pay (no insurance) in [facility]:  Organization         Address  Phone   Notes  Sickle Cell Patients, [facility]  Medicine [address] [place], [place] [phone]   [facility] [address] [place], [place] [phone]   [facility] [place]  [address], [place] [phone]   [facility]/Dr. Osei-Bonsu  [address], [place] or [address], [place] [phone] Phone number for both [place] and [place] locations is the same.  [facility] [address] [place] [phone]   [facility] [address] [place] or [address] [phone]   [facility] [address], [place] [phone], phone; [phone], fax Sees patients 1st and 3rd Saturday of every month.  Must not qualify for public or private insurance (i.e. Medicaid, Medicare, [place] Health Choice, Veterans' Benefits)  Household income should be no more than 200% of the poverty level The clinic cannot treat you if you are pregnant or think you are pregnant  Sexually transmitted diseases are not treated at the clinic.    Dental Care: Organization         Address  Phone  Notes  [facility] [address] [place], [place] [phone] Accepts children up to age 42 who are enrolled in [place] or [place] Health Choice; pregnant women with a Medicaid card; and children who have applied for Medicaid or [place] Health Choice, but were declined, whose parents can pay a reduced fee at time of service.  [facility]  [address], [place] [phone] Accepts children up to age 5 who are enrolled in [place] or [place] Health Choice; pregnant women with a Medicaid card; and children who have applied for Medicaid  or [place] Health Choice, but were declined, whose parents can pay a reduced fee at time of service.  [facility]  [address], [place] [phone] Patients are seen by appointment only. Walk-ins are not accepted. [facility] will see patients 38 years of age and older. Monday - Tuesday (8am-5pm) Most Wednesdays (8:30-5pm) $30 per visit, cash only  [facility]  [address], [facility] [phone] Patients are seen by appointment only. Walk-ins are not accepted. [facility] will see patients 38 years of age and older. One Wednesday Evening (Monthly: Volunteer Based).  $30 per visit, cash only  [facility]  [phone] for adults; Children under age 144, call Graduate Pediatric Dentistry at [phone]. Children aged 34-14, please call [phone] to request a pediatric application.  Dental services are provided in all areas of dental care including fillings, crowns and bridges, complete and partial dentures, implants, gum treatment, root canals, and extractions. Preventive care is also provided. Treatment is provided to both adults and children. Patients are selected via a lottery and there is often a waiting list.   [facility] [address], [place]  [phone] [email or web address]   [facility] [address], [place], [place] [phone], Ext. [phone] Second and Fourth Thursday of each month, opens at 6:30 AM; Clinic ends at 9 AM.  Patients are seen on a first-come first-served basis, and a limited number are seen during each clinic.   [facility]  [address], [place], [place] [phone]) [phone]   Eligibility Requirements You must have lived in [place], or [place] counties for at least the last three months.   You cannot be eligible for state or federal sponsored [place]healthcare insurance, including CIG[employer]eterans Administration, [place], or [employer]icare.   You generally cannot be eligible for healthcare insurance through your employer.    How to apply: Eligibility screenings are held every Tuesday and  Wednesday afternoon from 1:00 pm until 4:00 pm. You do not need an appointment for the interview!  [facility] [address], [place], [place] [phone]   [facility]  [phone]   [facility]  [phone]   [facility]  [phone]    Behavioral Health Resources in the Community: Intensive Outpatient Programs Organization         Address  Phone  Notes  [facility] [address], [place], [place] [phone]   [facility] [address], [place], [place] [phone]   [facility] [address], [place], [place]  [phone]   [facility] [address],  [place], [place] [phone] or [phone]   Substance Abuse Resources Organization         Address  Phone  Notes  [facility]  [phone]   [facility]  [phone]   [facility]  [phone]   [facility]  [phone]   Residential & Outpatient Substance Abuse Program  [phone]   Psychological Services Organization         Address  Phone  Notes  [facility]  [phone]- [phone]   [facility]  [phone]- [phone]   [facility] [address], [place] [phone] or [phone]    Mobile Crisis Teams Organization         Address  Phone  Notes  [facility], Mobile Crisis Care Unit  [phone]   [facility]  [address] [place], [place] [phone]   [facility] [address] [place] [phone]    Self-Help/Support Groups Organization         Address  Phone             Notes  [facility] [place] - variety of support groups  [phone]- [phone] Call for more information  Narcotics Anonymous (NA), [facility] [address], [employer] [place]  2 meetings at this location   Risk manager         Address  Phone  Notes  [facility] [address],    [place]  [phone]   [facility]  [address] [place] [address], [place], [place] [phone]   [facility] [address] [place], [place] [phone] Admissions: 8am-3pm M-F  [facility] [address]    [place], [place] [phone]   [facility] [address] [place], [place], [place] [phone]   [facility] [address]  [place], [place] [phone]   [facility] - Intensive Outpatient [address], [place], [place] [phone]   [facility] ([facility] [address] [place].,  [place], [place] [phone] or [phone]   [facility] ([facility]) [address], [place], [place] [phone] Accepts Medicaid  [facility] [address],  [place] [phone] Substance Abuse/Addiction Treatment   [place] [facility]         Address  Phone  Notes  [facility]  [phone]   Angie Fava, PhD [address] Ervin Knack [employer], [place]   [phone] or [phone]   [facility] Behavioral   [address] [place], [place] [phone]   [facility] [address], [place], [place] [phone] Insurance/Medicaid/sponsorship through [facility] [address]                                    [place], [place] [phone] Therapy/tele-psych/case  [facility] [address], [place] [phone]    Dr. Lolly Mustache  [phone]   [facility]  [employer] [facility] Dept. 1) [address], [place] 2) [address], [place] 3)  [address], [place] [phone]  [phone]   [facility] Child Abuse Hotline 2567765619 or [phone] (After Hours)        Behavioral Health Resources in the [facility]  Intensive Outpatient Programs: [facility]      [address] [place], [place] [phone] Both a day and evening program       [facility] Outpatient     [address]        [place], [place] [address] [phone]         [facility]: [facility] [address] [place] [phone]  [facility] Mental Health ACCESS LINE: [phone] or [phone] [address] [place], [place] [address] [email or web address]  Mobile Crisis Teams:  [facility]         Mobile Crisis Care Unit [phone]             [facility] [address] Ginette Otto [phone]                                         Interventionist [address] [place] [address] [place] [phone]  Self-Help/Support Groups: [facility] [employer] of support groups [phone] (call for more info)  Narcotics Anonymous (NA) [facility] [address] [place] - 2 meetings at this location  Residential Treatment Programs:  [facility]      [phone]        [place]       [phone]         [facility] [address] [place] [address]36 [place], [place]  [address] [phone]  [facility]  [address] [place], [place] [address] [phone] Admissions: 8am-3pm M-F  [facility]     [address]        [place], [place] [address]       [phone]         [facility] [address] Starling Manns [place], [place] [phone]  [facility] [address] [place], [place] [phone]  [facility] - Intensive Outpatient      [address]     [place], [place]       [phone]         [facility] ([facility]  [facility]     [address] [place], [place] [phone] or [phone]  [facility] ([facility])  [address] [place], [place] [phone]  [facility] [address] [place] [address]                                               [address] [place] [phone]  [facility] Resources: [facility] [place] [phone]               General Therapy                                                Angie Fava, PhD        [address] [place], [place] [address]         [phone]   Insurance  [facility]   [address] [place], [place] [address] [phone]  [facility] [address] [place], [place] [address] [phone] Insurance/Medicaid/sponsorship through [facility]                                              [address] [phone]  [place], [place] [address]    Therapy/tele-psych/case         [phone]          [facility] [address], [place]  [address]  Adolescent/group home/case management [phone]                                           Creola Corn PhD       General therapy       Insurance   [phone]         Dr. Lolly Mustache Insurance [phone] M-F  [facility] Medicaid, sponsorship [phone]

## 2014-07-04 NOTE — ED Notes (Signed)
Patient transported to CT 

## 2014-07-04 NOTE — Progress Notes (Addendum)
89 yr old [employer] [place] pt with back pain Pt with [facility] ED visits x 4 in last 6 months.   ED CM consulted by ED PA after pt stated no pcp, get pain meds "off the street" 1626 Cm spoke with pt and male at bedside.  Pt informed CM [facility] and xanax but left States he was informed he could go back to [facility].  Pt informed Cm he was dx with bipolar but then informed it was anxiety by "Doctor [place]" States [place] placed him on xanax to "prevent seizures and anxiety" but [facility] staff wanted to wean him off of xanax and he refused.  States he got angry, scared and left, not express his fear of how the change in meds would affect him. Denies he is being [facility] by such agencies like [facility], [facility], etc.  Pt stated he was not receiving "what I wanted"  [facility], ED PA concern for need of family doctor and his need for pain management by getting meds "off the street" .  CM discussed [facility] EDP vs pcp services. Discussed his 4 ED visits and ED CPs. Cm provided a 6 page list of medicare pcps along with medicare.gov website to assist to find a pcp. Pt shared that most pcp would see him and would stop seeing him when it came to managing his pain issues.  CM encouraged pt to return to [facility], obtain a pcp and to re start [facility].

## 2014-07-12 ENCOUNTER — Encounter (HOSPITAL_COMMUNITY): Payer: Self-pay

## 2014-07-12 ENCOUNTER — Emergency Department (HOSPITAL_COMMUNITY)
Admission: EM | Admit: 2014-07-12 | Discharge: 2014-07-12 | Disposition: A | Discharge status: Home / Self Care | Payer: Medicare Other | Attending: Emergency Medicine | Admitting: Emergency Medicine

## 2014-07-12 DIAGNOSIS — R55 Syncope and collapse: Secondary | ICD-10-CM | POA: Diagnosis present

## 2014-07-12 DIAGNOSIS — G8929 Other chronic pain: Secondary | ICD-10-CM | POA: Diagnosis not present

## 2014-07-12 DIAGNOSIS — Z8719 Personal history of other diseases of the digestive system: Secondary | ICD-10-CM | POA: Insufficient documentation

## 2014-07-12 DIAGNOSIS — M542 Cervicalgia: Secondary | ICD-10-CM | POA: Insufficient documentation

## 2014-07-12 DIAGNOSIS — Z720 Tobacco use: Secondary | ICD-10-CM | POA: Diagnosis not present

## 2014-07-12 DIAGNOSIS — Z79899 Other long term (current) drug therapy: Secondary | ICD-10-CM | POA: Insufficient documentation

## 2014-07-12 DIAGNOSIS — R42 Dizziness and giddiness: Secondary | ICD-10-CM | POA: Diagnosis not present

## 2014-07-12 DIAGNOSIS — M25511 Pain in right shoulder: Secondary | ICD-10-CM | POA: Insufficient documentation

## 2014-07-12 MED ORDER — IBUPROFEN 800 MG PO TABS
800.0000 mg | ORAL_TABLET | Freq: Once | ORAL | Status: AC
  Administered 2014-07-12: 800 mg via ORAL
  Filled 2014-07-12: qty 1
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
## 2014-09-28 ENCOUNTER — Emergency Department (INDEPENDENT_AMBULATORY_CARE_PROVIDER_SITE_OTHER)
Admission: EM | Admit: 2014-09-28 | Discharge: 2014-09-28 | Disposition: A | Discharge status: Home / Self Care | Payer: Medicare Other | Source: Home / Self Care | Attending: Family Medicine | Admitting: Family Medicine

## 2014-09-28 ENCOUNTER — Encounter (HOSPITAL_COMMUNITY): Payer: Self-pay | Admitting: Emergency Medicine

## 2014-09-28 DIAGNOSIS — K122 Cellulitis and abscess of mouth: Secondary | ICD-10-CM | POA: Diagnosis not present

## 2014-09-28 MED ORDER — CLINDAMYCIN HCL 300 MG PO CAPS: 300.0000 mg | ORAL_CAPSULE | Freq: Three times a day (TID) | ORAL | Status: DC

## 2014-09-28 MED ORDER — KETOROLAC TROMETHAMINE 60 MG/2ML IM SOLN
60.0000 mg | Freq: Once | INTRAMUSCULAR | Status: AC
  Administered 2014-09-28: 60 mg via INTRAMUSCULAR

## 2014-09-28 MED ORDER — KETOROLAC TROMETHAMINE 60 MG/2ML IM SOLN
INTRAMUSCULAR | Status: AC
  Filled 2014-09-28: qty 2

## 2014-09-28 MED ORDER — BUPIVACAINE-EPINEPHRINE (PF) 0.5% -1:200000 IJ SOLN
INTRAMUSCULAR | Status: AC
  Filled 2014-09-28: qty 1.8

## 2014-09-28 NOTE — ED Notes (Signed)
Pt reports left side facial swelling onset 2 days associated w/fevers, chills Denies inj/trauma Alert, no signs of acute distress.

## 2014-09-28 NOTE — Discharge Instructions (Signed)
A significant oral infection. Urologic is been changed to clindamycin. Please go to the emergency room if you are not better in 24-48 hours or if you get worse. He will also given a dose of Toradol to help with your pain.

## 2014-09-28 NOTE — ED Provider Notes (Signed)
CSN: [id number]     Arrival date & time 09/28/14  1447 History   First MD Initiated Contact with Patient 09/28/14 1623     Chief Complaint  Patient presents with  . Facial Swelling   (Consider location/radiation/quality/duration/timing/severity/associated sxs/prior Treatment) HPI  2 days ago patient developed pain and facial swelling. Started on amoxicillin at onset of symptoms. This is from the left over prescription at home. Patient states he was taking 500 mg every 6 hours. Symptoms are continuous and getting worse. Patient endorses fevers. Patient states that his girlfriend drained a lump in his mouth which was very painful and copious amounts of pus came out. Denies any neck stiffness, headache, chest pain, short of breath, palpitations, rash.  Past Medical History  Diagnosis Date  . Hernia     Bilateral Inguinal  . Cervical stenosis of spinal canal    Past Surgical History  Procedure Laterality Date  . Inguinal hernia repair    . Thumb surgery     Family History  Problem Relation Age of Onset  . Hypertension Mother   . Heart disease Father   . Crohn's disease Maternal Aunt   . Colon cancer Paternal Grandfather    History  Substance Use Topics  . Smoking status: Current Every Day Smoker -- 1.00 packs/day for 25 years  . Smokeless tobacco: Never Used  . Alcohol Use: No    Review of Systems Per HPI with all other pertinent systems negative.   Allergies  Iohexol  Home Medications   Prior to Admission medications   Medication Sig Start Date End Date Taking? Authorizing Provider  ALPRAZolam Prudy Feeler(XANAX) 1 MG tablet Take 1 mg by mouth daily as needed for anxiety.    Historical Provider, MD  clindamycin (CLEOCIN) 300 MG capsule Take 1 capsule (300 mg total) by mouth 3 (three) times daily. 09/28/14   Ozella Rocksavid J Kresta Templeman, MD  ibuprofen (ADVIL,MOTRIN) 800 MG tablet Take 1 tablet (800 mg total) by mouth every 8 (eight) hours as needed for mild pain or moderate pain. 07/04/14   Trixie DredgeEmily  West, PA-C  methocarbamol (ROBAXIN) 500 MG tablet Take 1 tablet (500 mg total) by mouth every 6 (six) hours as needed for muscle spasms (and pain). 07/04/14   Trixie DredgeEmily West, PA-C  ondansetron (ZOFRAN) 4 MG tablet Take 1 tablet (4 mg total) by mouth every 6 (six) hours. Patient not taking: Reported on 07/04/2014 05/12/14   Marlon Peliffany Greene, PA-C  oxyCODONE-acetaminophen (PERCOCET) 10-325 MG per tablet Take 1 tablet by mouth every 4 (four) hours as needed for pain.    Historical Provider, MD  oxyCODONE-acetaminophen (PERCOCET/ROXICET) 5-325 MG per tablet Take 1-2 tablets by mouth every 6 (six) hours as needed for severe pain. Patient not taking: Reported on 07/04/2014 05/12/14   Marlon Peliffany Greene, PA-C   BP 111/72 mmHg  Pulse 77  Temp(Src) 98.5 F (36.9 C) (Oral)  Resp 16  SpO2 97% Physical Exam Physical Exam  Constitutional: oriented to person, place, and time. appears well-developed and well-nourished. No distress.  HENT:  1 x 1.5 cm hard palate fluctuant lesion. Drained as below. Numerous dental caries. Surrounding soft tissue swelling of the cheek and left infraorbital region. Head: Normocephalic and atraumatic.  Eyes: EOMI. PERRL.  Neck: Normal range of motion.  Cardiovascular: RRR, no m/r/g, 2+ distal pulses,  Pulmonary/Chest: Effort normal and breath sounds normal. No respiratory distress.  Abdominal: Soft. Bowel sounds are normal. NonTTP, no distension.  Musculoskeletal: Normal range of motion. Non ttp, no effusion.  Neurological: alert and  oriented to person, place, and time.  Skin: Skin is warm. No rash noted. non diaphoretic.  Psychiatric: normal mood and affect. behavior is normal. Judgment and thought content normal.   ED Course  INCISION AND DRAINAGE Date/Time: 09/28/2014 4:42 PM Performed by: Konrad Dolores, Dushawn Pusey J Authorized by: Konrad Dolores, Marletta Bousquet J Consent: Verbal consent obtained. Risks and benefits: risks, benefits and alternatives were discussed Consent given by: patient Patient  identity confirmed: verbally with patient Type: abscess Location: Hard palate. Anesthesia: local infiltration Local anesthetic: Marcaine dental cartridge. Anesthetic total: 2 ml Needle gauge: 18 Incision type: single straight Complexity: simple Drainage: purulent and  bloody Drainage amount: scant Wound treatment: wound left open Patient tolerance: Patient tolerated the procedure well with no immediate complications   (including critical care time) Labs Review Labs Reviewed  CULTURE, ROUTINE-ABSCESS    Imaging Review No results found.   MDM   1. Oral abscess    I&D as above Stop amox Start clinda toradol  im given Pt to go to ED if gets worse.     Ozella Rocks, MD 09/28/14 [id number]

## 2014-10-01 ENCOUNTER — Emergency Department (HOSPITAL_COMMUNITY): Payer: Medicare Other

## 2014-10-01 ENCOUNTER — Encounter (HOSPITAL_COMMUNITY): Payer: Self-pay | Admitting: Emergency Medicine

## 2014-10-01 ENCOUNTER — Inpatient Hospital Stay (HOSPITAL_COMMUNITY)
Admission: EM | Admit: 2014-10-01 | Discharge: 2014-10-06 | DRG: 440 | Disposition: A | Discharge status: Home / Self Care | Payer: Medicare Other | Attending: Internal Medicine | Admitting: Internal Medicine

## 2014-10-01 DIAGNOSIS — K029 Dental caries, unspecified: Secondary | ICD-10-CM | POA: Diagnosis present

## 2014-10-01 DIAGNOSIS — Z888 Allergy status to other drugs, medicaments and biological substances status: Secondary | ICD-10-CM

## 2014-10-01 DIAGNOSIS — K859 Acute pancreatitis without necrosis or infection, unspecified: Secondary | ICD-10-CM | POA: Diagnosis present

## 2014-10-01 DIAGNOSIS — F17210 Nicotine dependence, cigarettes, uncomplicated: Secondary | ICD-10-CM | POA: Diagnosis not present

## 2014-10-01 DIAGNOSIS — R112 Nausea with vomiting, unspecified: Secondary | ICD-10-CM | POA: Diagnosis not present

## 2014-10-01 DIAGNOSIS — K0530 Chronic periodontitis, unspecified: Secondary | ICD-10-CM | POA: Diagnosis present

## 2014-10-01 DIAGNOSIS — K047 Periapical abscess without sinus: Secondary | ICD-10-CM | POA: Diagnosis not present

## 2014-10-01 DIAGNOSIS — K850 Idiopathic acute pancreatitis: Secondary | ICD-10-CM | POA: Diagnosis not present

## 2014-10-01 DIAGNOSIS — M4802 Spinal stenosis, cervical region: Secondary | ICD-10-CM | POA: Diagnosis not present

## 2014-10-01 DIAGNOSIS — R1013 Epigastric pain: Secondary | ICD-10-CM | POA: Diagnosis not present

## 2014-10-01 DIAGNOSIS — F39 Unspecified mood [affective] disorder: Secondary | ICD-10-CM | POA: Diagnosis not present

## 2014-10-01 DIAGNOSIS — K045 Chronic apical periodontitis: Secondary | ICD-10-CM | POA: Diagnosis present

## 2014-10-01 HISTORY — DX: Acute pancreatitis without necrosis or infection, unspecified: K85.90

## 2014-10-01 LAB — HEPATIC FUNCTION PANEL
ALBUMIN: 4 g/dL (ref 3.5–5.0)
ALT: 11 U/L — AB (ref 17–63)
AST: 15 U/L (ref 15–41)
Alkaline Phosphatase: 98 U/L (ref 38–126)
Bilirubin, Direct: 0.1 mg/dL (ref 0.1–0.5)
Indirect Bilirubin: 0.5 mg/dL (ref 0.3–0.9)
TOTAL PROTEIN: 6.9 g/dL (ref 6.5–8.1)
Total Bilirubin: 0.6 mg/dL (ref 0.3–1.2)

## 2014-10-01 LAB — BASIC METABOLIC PANEL
ANION GAP: 8 (ref 5–15)
BUN: <5 mg/dL — ABNORMAL LOW (ref 6–20)
CALCIUM: 9.4 mg/dL (ref 8.9–10.3)
CO2: 29 mmol/L (ref 22–32)
Chloride: 104 mmol/L (ref 101–111)
Creatinine, Ser: 0.84 mg/dL (ref 0.61–1.24)
GFR calc Af Amer: >60 mL/min (ref >60)
Glucose, Bld: 97 mg/dL (ref 65–99)
Potassium: 3.9 mmol/L (ref 3.5–5.1)
Sodium: 141 mmol/L (ref 135–145)

## 2014-10-01 LAB — URINALYSIS, ROUTINE W REFLEX MICROSCOPIC
Glucose, UA: NEGATIVE mg/dL
HGB URINE DIPSTICK: NEGATIVE
Ketones, ur: 15 mg/dL — AB
Leukocytes, UA: NEGATIVE
Nitrite: NEGATIVE
PROTEIN: NEGATIVE mg/dL
Specific Gravity, Urine: 1.025 (ref 1.005–1.030)
Urobilinogen, UA: 1 mg/dL (ref 0.0–1.0)
pH: 6.5 (ref 5.0–8.0)

## 2014-10-01 LAB — CBC WITH DIFFERENTIAL/PLATELET
BASOS ABS: 0 10*3/uL (ref 0.0–0.1)
Basophils Relative: 0 % (ref 0–1)
EOS PCT: 1 % (ref 0–5)
Eosinophils Absolute: 0.2 10*3/uL (ref 0.0–0.7)
HCT: 45.3 % (ref 39.0–52.0)
HEMOGLOBIN: 15.1 g/dL (ref 13.0–17.0)
Lymphocytes Relative: 13 % (ref 12–46)
Lymphs Abs: 1.6 10*3/uL (ref 0.7–4.0)
MCH: 30.8 pg (ref 26.0–34.0)
MCHC: 33.3 g/dL (ref 30.0–36.0)
MCV: 92.4 fL (ref 78.0–100.0)
Monocytes Absolute: 0.7 10*3/uL (ref 0.1–1.0)
Monocytes Relative: 5 % (ref 3–12)
NEUTROS ABS: 9.8 10*3/uL — AB (ref 1.7–7.7)
Neutrophils Relative %: 81 % — ABNORMAL HIGH (ref 43–77)
Platelets: 220 10*3/uL (ref 150–400)
RBC: 4.9 MIL/uL (ref 4.22–5.81)
RDW: 13.1 % (ref 11.5–15.5)
WBC: 12.2 10*3/uL — AB (ref 4.0–10.5)

## 2014-10-01 LAB — RAPID URINE DRUG SCREEN, HOSP PERFORMED
Amphetamines: NOT DETECTED
BARBITURATES: NOT DETECTED
Benzodiazepines: POSITIVE — AB
COCAINE: NOT DETECTED
Opiates: POSITIVE — AB
TETRAHYDROCANNABINOL: POSITIVE — AB

## 2014-10-01 LAB — I-STAT TROPONIN, ED: Troponin i, poc: 0 ng/mL (ref 0.00–0.08)

## 2014-10-01 LAB — AMYLASE: AMYLASE: 123 U/L — AB (ref 28–100)

## 2014-10-01 LAB — LIPASE, BLOOD: LIPASE: 152 U/L — AB (ref 22–51)

## 2014-10-01 MED ORDER — SODIUM CHLORIDE 0.9 % IV BOLUS (SEPSIS)
1000.0000 mL | Freq: Once | INTRAVENOUS | Status: AC
  Administered 2014-10-01: 1000 mL via INTRAVENOUS

## 2014-10-01 MED ORDER — OXYCODONE-ACETAMINOPHEN 5-325 MG PO TABS
1.0000 | ORAL_TABLET | Freq: Once | ORAL | Status: AC
  Administered 2014-10-01: 1 via ORAL

## 2014-10-01 MED ORDER — HYDROMORPHONE HCL 1 MG/ML IJ SOLN
1.0000 mg | Freq: Once | INTRAMUSCULAR | Status: AC
  Administered 2014-10-01: 1 mg via INTRAVENOUS
  Filled 2014-10-01: qty 1

## 2014-10-01 MED ORDER — LORAZEPAM 2 MG/ML IJ SOLN
1.0000 mg | Freq: Once | INTRAMUSCULAR | Status: AC
  Administered 2014-10-02: 1 mg via INTRAVENOUS
  Filled 2014-10-01: qty 1

## 2014-10-01 MED ORDER — ONDANSETRON HCL 4 MG/2ML IJ SOLN
4.0000 mg | Freq: Once | INTRAMUSCULAR | Status: AC
  Administered 2014-10-01: 4 mg via INTRAVENOUS
  Filled 2014-10-01: qty 2

## 2014-10-01 MED ORDER — METHYLPREDNISOLONE SODIUM SUCC 125 MG IJ SOLR
125.0000 mg | Freq: Once | INTRAMUSCULAR | Status: AC
  Administered 2014-10-01: 125 mg via INTRAVENOUS
  Filled 2014-10-01: qty 2

## 2014-10-01 MED ORDER — IOHEXOL 300 MG/ML  SOLN
100.0000 mL | Freq: Once | INTRAMUSCULAR | Status: AC | PRN
  Administered 2014-10-01: 100 mL via INTRAVENOUS

## 2014-10-01 MED ORDER — DIPHENHYDRAMINE HCL 50 MG/ML IJ SOLN
50.0000 mg | Freq: Once | INTRAMUSCULAR | Status: AC
  Administered 2014-10-01: 50 mg via INTRAVENOUS
  Filled 2014-10-01: qty 1

## 2014-10-01 MED ORDER — PROMETHAZINE HCL 25 MG/ML IJ SOLN
25.0000 mg | INTRAMUSCULAR | Status: AC
  Administered 2014-10-01: 25 mg via INTRAVENOUS
  Filled 2014-10-01: qty 1

## 2014-10-01 NOTE — ED Notes (Signed)
Pt reports epigastric pain that radiates into middle back and lower middle chest.  Pt sts "It feels like somebody poured lighter fluid down my throat."  Pt reports he has had this pain previously and he was diagnosed with pancreatitis.

## 2014-10-01 NOTE — ED Provider Notes (Signed)
CSN: [id number]     Arrival date & time 10/01/14  1548 History   First MD Initiated Contact with Patient 10/01/14 1735     Chief Complaint  Patient presents with  . Abdominal Pain     (Consider location/radiation/quality/duration/timing/severity/associated sxs/prior Treatment) HPI   Mr. Raedyn Wenke Nealy Montez Hageman. is a 39 year old male with history of pancreatitis, hernia repair, cervical stenosis, depression and mood disorder, substance abuse, and tobacco abuse who presents to the emergency room with sudden onset epigastric abdominal pain that woke him up at 10 AM, and radiates around both his right and left side and shoots to his back. The pain is constant, rated 9 out of 10, associated with fever, chills, sweats, and 4 episodes of yellow emesis today. Patient denies any urinary symptoms denies any diarrhea.  Denies hematemesis, melena, and hematochezia.  He reports a double mesh repair of bilateral inguinal hernias, but no other abdominal surgeries. Patient was recently seen and treated for a dental abscess which was drained, he is currently on clindamycin. He woke up at 5:30 AM to eat and take his clindamycin as instructed. He then woke up at 10 AM with the abdominal pain.  He has mild shortness of breath which is unchanged from his baseline, he has not had any lightheaded episodes or syncope.  He denies any chest pain, palpitations or lower extremity edema. Patient states he is a current smoker, denies EtOH, denies illegal drug use and denies injecting drugs.   Past Medical History  Diagnosis Date  . Hernia     Bilateral Inguinal  . Cervical stenosis of spinal canal   . Pancreatitis    Past Surgical History  Procedure Laterality Date  . Inguinal hernia repair    . Thumb surgery     Family History  Problem Relation Age of Onset  . Hypertension Mother   . Heart disease Father   . Crohn's disease Maternal Aunt   . Colon cancer Paternal Grandfather    History  Substance Use Topics  .  Smoking status: Current Every Day Smoker -- 1.00 packs/day for 25 years  . Smokeless tobacco: Never Used  . Alcohol Use: No    Review of Systems 10 Systems reviewed and are negative for acute change except as noted in the HPI.    Allergies  Iohexol  Home Medications   Prior to Admission medications   Medication Sig Start Date End Date Taking? Authorizing Provider  ALPRAZolam Prudy Feeler) 1 MG tablet Take 1 mg by mouth 4 (four) times daily.    Yes Historical Provider, MD  clindamycin (CLEOCIN) 300 MG capsule Take 1 capsule (300 mg total) by mouth 3 (three) times daily. Patient taking differently: Take 300 mg by mouth 3 (three) times daily. 7 day course started 09/28/14 09/28/14  Yes Ozella Rocks, MD  ibuprofen (ADVIL,MOTRIN) 200 MG tablet Take 400-800 mg by mouth 2 (two) times daily as needed (pain).   Yes Historical Provider, MD  ibuprofen (ADVIL,MOTRIN) 800 MG tablet Take 1 tablet (800 mg total) by mouth every 8 (eight) hours as needed for mild pain or moderate pain. Patient not taking: Reported on 10/01/2014 07/04/14   Trixie Dredge, PA-C  methocarbamol (ROBAXIN) 500 MG tablet Take 1 tablet (500 mg total) by mouth every 6 (six) hours as needed for muscle spasms (and pain). Patient not taking: Reported on 10/01/2014 07/04/14   Trixie Dredge, PA-C  ondansetron (ZOFRAN) 4 MG tablet Take 1 tablet (4 mg total) by mouth every 6 (six) hours. Patient  not taking: Reported on 07/04/2014 05/12/14   Marlon Pel, PA-C  oxyCODONE-acetaminophen (PERCOCET/ROXICET) 5-325 MG per tablet Take 1-2 tablets by mouth every 6 (six) hours as needed for severe pain. Patient not taking: Reported on 07/04/2014 05/12/14   Marlon Pel, PA-C   BP 148/84 mmHg  Pulse 61  Temp(Src) 98.2 F (36.8 C) (Oral)  Resp 13  SpO2 99% Physical Exam  Constitutional: He is oriented to person, place, and time. He appears well-developed and well-nourished. No distress.  Chronically ill-appearing male, appears older than stated age, nontoxic  appearing  HENT:  Head: Normocephalic and atraumatic.  Nose: Nose normal.  Mouth/Throat: Oropharynx is clear and moist. No oropharyngeal exudate.  Oral mucosa dry  Eyes: Conjunctivae and EOM are normal. Pupils are equal, round, and reactive to light. Right eye exhibits no discharge. Left eye exhibits no discharge. No scleral icterus.  Neck: Normal range of motion. No JVD present. No tracheal deviation present. No thyromegaly present.  Cardiovascular: Normal rate, regular rhythm, normal heart sounds and intact distal pulses.  Exam reveals no gallop and no friction rub.   No murmur heard. Pulmonary/Chest: Effort normal and breath sounds normal. No respiratory distress. He has no wheezes. He has no rales. He exhibits no tenderness.  Abdominal: Soft. Bowel sounds are normal. He exhibits no distension and no mass. There is tenderness. There is rebound and guarding.  Tenderness to palpation epigastric left upper quadrant, left CVA tenderness, + rebound and + guarding  Musculoskeletal: Normal range of motion. He exhibits no edema or tenderness.  Lymphadenopathy:    He has no cervical adenopathy.  Neurological: He is alert and oriented to person, place, and time. He has normal reflexes. No cranial nerve deficit. He exhibits normal muscle tone. Coordination normal.  Skin: Skin is warm and dry. No rash noted. He is not diaphoretic. No erythema. No pallor.  Psychiatric: He has a normal mood and affect. His behavior is normal. Judgment and thought content normal.  Nursing note and vitals reviewed.   ED Course  Procedures (including critical care time) Labs Review Labs Reviewed  AMYLASE - Abnormal; Notable for the following:    Amylase 123 (*)    All other components within normal limits  CBC WITH DIFFERENTIAL/PLATELET - Abnormal; Notable for the following:    WBC 12.2 (*)    Neutrophils Relative % 81 (*)    Neutro Abs 9.8 (*)    All other components within normal limits  BASIC METABOLIC PANEL -  Abnormal; Notable for the following:    BUN <5 (*)    All other components within normal limits  HEPATIC FUNCTION PANEL - Abnormal; Notable for the following:    ALT 11 (*)    All other components within normal limits  LIPASE, BLOOD  URINALYSIS, ROUTINE W REFLEX MICROSCOPIC (NOT AT [facility])  URINE RAPID DRUG SCREEN, HOSP PERFORMED  I-STAT TROPOININ, ED    Imaging Review No results found.   EKG Interpretation   Date/Time:  Saturday October 01 2014 17:27:15 EDT Ventricular Rate:  61 PR Interval:  166 QRS Duration: 117 QT Interval:  399 QTC Calculation: 402 R Axis:   52 Text Interpretation:  Sinus rhythm Incomplete right bundle branch block  agree. no change from old Confirmed by Donnald Garre, MD, Lebron Conners [phone]) on  10/01/2014 6:10:46 PM      MDM   Final diagnoses:  Epigastric abdominal pain  Epigastric abdominal pain   Patient with epigastric abdominal pain and left upper quadrant abdominal pain radiating to the  back, he is diffusely tender to the upper abdomen, with mild tenderness and CVA on the left side  CBC, CMP, lipase, NPO with zofran, pain meds, IVF, CT abd/pelvis UA + drug screen  Patient with contrast allergy, was given Benadryl and Solu-Medrol per protocol 1 hour before scan, has had no reaction to the contrast given in radiology.  Patient with negative troponin, no change on EKG, labs pertinent for mild leukocytosis 12.2, lipase 152 CT abdomen pelvis reveal mild. Pancreatic inflammation consistent with acute pancreatitis, without abscess or fluid collection  Patient has been asking for repeated Dilaudid doses, almost every half hour.  He states he's been maintained on Xanax for 12 years.  Urine drug screen is positive for opiates, benzos and THC.  Dose of ativan given to prevent withdrawal, it's unclear who has been prescribing benzo's and psych meds for mood disorder.  Patient has been discussed with Dr. Donnald Garre, who is called hospitalist for admission for acute  pancreatitis. Filed Vitals:   10/01/14 2330  BP: 129/74  Pulse: 67  Temp:   Resp: [id number] PA-C 10/02/14 0003  Arby Barrette, MD 10/09/14 [phone]

## 2014-10-01 NOTE — ED Notes (Signed)
Pt placed in a gown and hooked up to the monitor with the 5 lead, BP cuff and pulse ox 

## 2014-10-01 NOTE — ED Notes (Signed)
PA and RN addressed pt pain; pt needs to give urine sample or IN/OUT will be done; pt will try to use urinal;

## 2014-10-01 NOTE — ED Notes (Addendum)
Pt c/o upper abd pain onset this am.  Nausea with vomiting.  Denies any diarrhea..  Pt has hx of pancreatitis  St's abd is burning

## 2014-10-02 ENCOUNTER — Other Ambulatory Visit (HOSPITAL_COMMUNITY): Payer: Self-pay

## 2014-10-02 ENCOUNTER — Inpatient Hospital Stay (HOSPITAL_COMMUNITY): Payer: Medicare Other

## 2014-10-02 DIAGNOSIS — K859 Acute pancreatitis, unspecified: Secondary | ICD-10-CM | POA: Diagnosis not present

## 2014-10-02 DIAGNOSIS — K045 Chronic apical periodontitis: Secondary | ICD-10-CM | POA: Diagnosis present

## 2014-10-02 DIAGNOSIS — R109 Unspecified abdominal pain: Secondary | ICD-10-CM | POA: Diagnosis not present

## 2014-10-02 DIAGNOSIS — M8618 Other acute osteomyelitis, other site: Secondary | ICD-10-CM | POA: Diagnosis not present

## 2014-10-02 DIAGNOSIS — K029 Dental caries, unspecified: Secondary | ICD-10-CM | POA: Diagnosis present

## 2014-10-02 DIAGNOSIS — Z888 Allergy status to other drugs, medicaments and biological substances status: Secondary | ICD-10-CM | POA: Diagnosis not present

## 2014-10-02 DIAGNOSIS — R1013 Epigastric pain: Secondary | ICD-10-CM | POA: Diagnosis not present

## 2014-10-02 DIAGNOSIS — F39 Unspecified mood [affective] disorder: Secondary | ICD-10-CM | POA: Diagnosis present

## 2014-10-02 DIAGNOSIS — K850 Idiopathic acute pancreatitis: Secondary | ICD-10-CM

## 2014-10-02 DIAGNOSIS — K047 Periapical abscess without sinus: Secondary | ICD-10-CM | POA: Diagnosis not present

## 2014-10-02 DIAGNOSIS — M4802 Spinal stenosis, cervical region: Secondary | ICD-10-CM | POA: Diagnosis present

## 2014-10-02 DIAGNOSIS — K0530 Chronic periodontitis, unspecified: Secondary | ICD-10-CM | POA: Diagnosis present

## 2014-10-02 DIAGNOSIS — F17210 Nicotine dependence, cigarettes, uncomplicated: Secondary | ICD-10-CM | POA: Diagnosis present

## 2014-10-02 LAB — COMPREHENSIVE METABOLIC PANEL
ALBUMIN: 3.2 g/dL — AB (ref 3.5–5.0)
ALT: 9 U/L — ABNORMAL LOW (ref 17–63)
AST: 14 U/L — ABNORMAL LOW (ref 15–41)
Alkaline Phosphatase: 87 U/L (ref 38–126)
Anion gap: 7 (ref 5–15)
BILIRUBIN TOTAL: 0.4 mg/dL (ref 0.3–1.2)
CO2: 25 mmol/L (ref 22–32)
CREATININE: 0.65 mg/dL (ref 0.61–1.24)
Calcium: 9.2 mg/dL (ref 8.9–10.3)
Chloride: 109 mmol/L (ref 101–111)
GFR calc Af Amer: >60 mL/min (ref >60)
GFR calc non Af Amer: >60 mL/min (ref >60)
Glucose, Bld: 117 mg/dL — ABNORMAL HIGH (ref 65–99)
Potassium: 4.3 mmol/L (ref 3.5–5.1)
Sodium: 141 mmol/L (ref 135–145)
TOTAL PROTEIN: 6.1 g/dL — AB (ref 6.5–8.1)

## 2014-10-02 LAB — CBC WITH DIFFERENTIAL/PLATELET
BASOS PCT: 0 % (ref 0–1)
Basophils Absolute: 0 10*3/uL (ref 0.0–0.1)
EOS ABS: 0 10*3/uL (ref 0.0–0.7)
Eosinophils Relative: 0 % (ref 0–5)
HEMATOCRIT: 41.7 % (ref 39.0–52.0)
HEMOGLOBIN: 14 g/dL (ref 13.0–17.0)
Lymphocytes Relative: 11 % — ABNORMAL LOW (ref 12–46)
Lymphs Abs: 0.9 10*3/uL (ref 0.7–4.0)
MCH: 30.7 pg (ref 26.0–34.0)
MCHC: 33.6 g/dL (ref 30.0–36.0)
MCV: 91.4 fL (ref 78.0–100.0)
MONO ABS: 0.1 10*3/uL (ref 0.1–1.0)
MONOS PCT: 2 % — AB (ref 3–12)
Neutro Abs: 6.9 10*3/uL (ref 1.7–7.7)
Neutrophils Relative %: 87 % — ABNORMAL HIGH (ref 43–77)
Platelets: 177 10*3/uL (ref 150–400)
RBC: 4.56 MIL/uL (ref 4.22–5.81)
RDW: 12.9 % (ref 11.5–15.5)
WBC: 7.9 10*3/uL (ref 4.0–10.5)

## 2014-10-02 LAB — I-STAT CG4 LACTIC ACID, ED: Lactic Acid, Venous: 0.57 mmol/L (ref 0.5–2.0)

## 2014-10-02 LAB — CULTURE, ROUTINE-ABSCESS
Culture: NO GROWTH
GRAM STAIN: NONE SEEN

## 2014-10-02 MED ORDER — SODIUM CHLORIDE 0.9 % IV SOLN
INTRAVENOUS | Status: DC
  Administered 2014-10-02: 03:00:00 via INTRAVENOUS

## 2014-10-02 MED ORDER — DEXTROSE-NACL 5-0.45 % IV SOLN
INTRAVENOUS | Status: DC
  Administered 2014-10-02 – 2014-10-03 (×3): via INTRAVENOUS
  Administered 2014-10-04: 100 mL/h via INTRAVENOUS
  Administered 2014-10-04 – 2014-10-05 (×3): via INTRAVENOUS

## 2014-10-02 MED ORDER — ENOXAPARIN SODIUM 40 MG/0.4ML ~~LOC~~ SOLN
40.0000 mg | SUBCUTANEOUS | Status: DC
  Administered 2014-10-02 – 2014-10-06 (×5): 40 mg via SUBCUTANEOUS
  Filled 2014-10-02 (×5): qty 0.4

## 2014-10-02 MED ORDER — PANTOPRAZOLE SODIUM 40 MG IV SOLR
40.0000 mg | INTRAVENOUS | Status: DC
  Administered 2014-10-02 – 2014-10-06 (×5): 40 mg via INTRAVENOUS
  Filled 2014-10-02 (×5): qty 40

## 2014-10-02 MED ORDER — MORPHINE SULFATE 4 MG/ML IJ SOLN
4.0000 mg | INTRAMUSCULAR | Status: DC | PRN
  Administered 2014-10-02 (×3): 4 mg via INTRAVENOUS
  Filled 2014-10-02 (×3): qty 1

## 2014-10-02 MED ORDER — SODIUM CHLORIDE 0.9 % IV SOLN
3.0000 g | Freq: Four times a day (QID) | INTRAVENOUS | Status: DC
  Administered 2014-10-02 – 2014-10-06 (×18): 3 g via INTRAVENOUS
  Filled 2014-10-02 (×20): qty 3

## 2014-10-02 MED ORDER — MORPHINE SULFATE 4 MG/ML IJ SOLN
4.0000 mg | INTRAMUSCULAR | Status: DC | PRN
  Administered 2014-10-02: 4 mg via INTRAVENOUS
  Filled 2014-10-02: qty 1

## 2014-10-02 MED ORDER — ONDANSETRON HCL 4 MG/2ML IJ SOLN
4.0000 mg | Freq: Four times a day (QID) | INTRAMUSCULAR | Status: DC | PRN
  Administered 2014-10-03 – 2014-10-05 (×4): 4 mg via INTRAVENOUS
  Filled 2014-10-02 (×4): qty 2

## 2014-10-02 MED ORDER — DIPHENHYDRAMINE HCL 25 MG PO CAPS
25.0000 mg | ORAL_CAPSULE | Freq: Four times a day (QID) | ORAL | Status: AC
  Administered 2014-10-02 (×2): 25 mg via ORAL
  Filled 2014-10-02 (×2): qty 1

## 2014-10-02 MED ORDER — MORPHINE SULFATE 4 MG/ML IJ SOLN
4.0000 mg | INTRAMUSCULAR | Status: DC | PRN
  Administered 2014-10-02 – 2014-10-03 (×9): 4 mg via INTRAVENOUS
  Filled 2014-10-02 (×9): qty 1

## 2014-10-02 MED ORDER — DEXTROSE-NACL 5-0.45 % IV SOLN: INTRAVENOUS | Status: DC

## 2014-10-02 MED ORDER — PREDNISONE 20 MG PO TABS
40.0000 mg | ORAL_TABLET | Freq: Two times a day (BID) | ORAL | Status: AC
  Administered 2014-10-02 – 2014-10-03 (×2): 40 mg via ORAL
  Filled 2014-10-02 (×2): qty 2

## 2014-10-02 NOTE — Progress Notes (Signed)
Patient arrived to 4N25 at 0215 alert & oriented.  Ambulated to bed from stretcher without difficulty.  Oriented to room and use of call system, tv, and lights.

## 2014-10-02 NOTE — Progress Notes (Signed)
[facility] PROGRESS NOTE  Assessment/Plan: Pancreatitis, acute: - He denies any alcohol abuse, his acute pancreatitis etiology remains unknown. - We'll keep him nothing by mouth we'll change IV fluids to D5 half-normal saline.  Possible tooth abscess:  Cont Unasyn 3 gm Q6H.  Code Status: full Family Communication: none  Disposition Plan: home in am if pain improved.   Consultants:  none  Procedures:  Ct abd and pelvis  Antibiotics:  None  HPI/Subjective: Cont to have abd pain.  Objective: Filed Vitals:   10/02/14 0217 10/02/14 0245 10/02/14 0528 10/02/14 0929  BP: 141/100 136/72 119/68 123/66  Pulse: 67  70 67  Temp: 99.3 F (37.4 C)  98.7 F (37.1 C) 99.1 F (37.3 C)  TempSrc: Oral  Oral Oral  Resp: Height: 6' (1.829 m)     Weight: 70.988 kg (156 lb 8 oz)     SpO2: 98%  100% 98%    Intake/Output Summary (Last 24 hours) at 10/02/14 1035 Last data filed at 10/02/14 0002  Gross per 24 hour  Intake   2000 ml  Output      0 ml  Net   2000 ml   Filed Weights   10/02/14 0217  Weight: 70.988 kg (156 lb 8 oz)    Exam:  General: Alert, awake, oriented x3, in no acute distress.  HEENT: No bruits, no goiter.  Heart: Regular rate and rhythm, without murmurs, rubs, gallops.  Lungs: Good air movement, clear Abdomen: refused Neuro: Grossly intact, nonfocal.   Data Reviewed: Basic Metabolic Panel:  Recent Labs Lab 10/01/14 1710 10/02/14 0810  NA 141 141  K 3.9 4.3  CL 104 109  CO2 29 25  GLUCOSE 97 117*  BUN <5* <5*  CREATININE 0.84 0.65  CALCIUM 9.4 9.2   Liver Function Tests:  Recent Labs Lab 10/01/14 1710 10/02/14 0810  AST 15 14*  ALT 11* 9*  ALKPHOS 98 87  BILITOT 0.6 0.4  PROT 6.9 6.1*  ALBUMIN 4.0 3.2*    Recent Labs Lab 10/01/14 1710 10/01/14 1837  LIPASE  --  152*  AMYLASE 123*  --    No results for input(s): AMMONIA in the last 168 hours. CBC:  Recent Labs Lab 10/01/14 1710 10/02/14 0810    WBC 12.2* 7.9  NEUTROABS 9.8* 6.9  HGB 15.1 14.0  HCT 45.3 41.7  MCV 92.4 91.4  PLT 220 177   Cardiac Enzymes: No results for input(s): CKTOTAL, CKMB, CKMBINDEX, TROPONINI in the last 168 hours. BNP (last 3 results) No results for input(s): BNP in the last 8760 hours.  ProBNP (last 3 results) No results for input(s): PROBNP in the last 8760 hours.  CBG: No results for input(s): GLUCAP in the last 168 hours.  Recent Results (from the past 240 hour(s))  Culture, routine-abscess     Status: None   Collection Time: 09/28/14  4:37 PM  Result Value Ref Range Status   Specimen Description ABSCESS  Final   Special Requests HARD PALATE  Final   Gram Stain   Final    NO WBC SEEN NO SQUAMOUS EPITHELIAL CELLS SEEN NO ORGANISMS SEEN Performed at [facility]    Culture   Final    NO GROWTH 3 DAYS Performed at [facility]    Report Status 10/02/2014 FINAL  Final     Studies: Ct Abdomen Pelvis W Contrast  10/01/2014   CLINICAL DATA:  Epigastric abdominal pain radiating to the back for  1 day. Nausea and vomiting. History of pancreatitis.  EXAM: CT ABDOMEN AND PELVIS WITH CONTRAST  TECHNIQUE: Multidetector CT imaging of the abdomen and pelvis was performed using the standard protocol following bolus administration of intravenous contrast.  CONTRAST:  OMNIPAQUE IOHEXOL 300 MG/ML  SOLN  COMPARISON:  06/08/2013  FINDINGS: Minimal dependent atelectasis is noted in the lung bases.  The liver, gallbladder, spleen, adrenal glands, and kidneys have an unremarkable enhanced appearance. There is mild peripancreatic fat stranding, most noticeable about the tail. Pancreatic parenchyma demonstrates normal enhancement. No fluid collection is seen.  Oral contrast is present in the stomach and in nondilated loops of proximal and mid small bowel. There is no evidence of bowel obstruction. There is a moderate amount of colonic stool. No bowel wall thickening is seen. The appendix is not  clearly identified, however no inflammatory changes are seen in the right lower quadrant.  No free fluid or enlarged lymph nodes are identified. No acute osseous abnormality is seen.  IMPRESSION: Mild peripancreatic inflammation consistent with acute pancreatitis. No fluid collection.   Electronically Signed   By: Sebastian Ache   On: 10/01/2014 21:22    Scheduled Meds: . sodium chloride   Intravenous STAT  . ampicillin-sulbactam (UNASYN) IV  3 g Intravenous Q6H  . enoxaparin (LOVENOX) injection  40 mg Subcutaneous Q24H  . pantoprazole (PROTONIX) IV  40 mg Intravenous Q24H   Continuous Infusions: . dextrose 5 % and 0.45% NaCl      Time Spent: 25 min   Marinda Elk  [facility] Pager [phone]. If 7PM-7AM, please contact night-coverage at [email or web address], password [facility] 10/02/2014, 10:35 AM  LOS: 0 days

## 2014-10-02 NOTE — H&P (Signed)
History and Physical  Brent Morales. JYN:[id number] DOB: [birth year] DOA: 10/01/2014   PCP: No PCP Per Patient   Chief Complaint: abdominal pain  HPI: Brent Morales. is a 39 y.o. male with previous history of pancreatitis and substance abuse who came with cc of acute abdominal pain started yesterday in epigastric area radiating to the back with nausea and vomiting ( non bloody). He denied cough, dyspnea, chest pain, fever, chills, diarrhea or constipation. He said he has had swelling and pain in his left upper gum/teeth and the swelling almost shot his left eye closed. He went to urgent care 2 days ago and was given clindamycin. The swelling went down after taking the antibiotics.     Review of Systems:  A 12 point review of systems was negative except as per HPI.    Past Medical History  Diagnosis Date  . Hernia     Bilateral Inguinal  . Cervical stenosis of spinal canal   . Pancreatitis    Past Surgical History  Procedure Laterality Date  . Inguinal hernia repair    . Thumb surgery     Social History:  reports that he has been smoking.  He has never used smokeless tobacco. He reports that he does not drink alcohol or use illicit drugs. Patient lives at home with wife.   Allergies  Allergen Reactions  . Iohexol Other (See Comments)     Desc: PER MD/PER PATIENT NOT ALLERGIC 06/11/05 RM/PER RADIOLOGIST GIVE PREMEDICATION 06/12/05     Family History  Problem Relation Age of Onset  . Hypertension Mother   . Heart disease Father   . Crohn's disease Maternal Aunt   . Colon cancer Paternal Grandfather       Prior to Admission medications   Medication Sig Start Date End Date Taking? Authorizing Provider  ALPRAZolam Prudy Feeler) 1 MG tablet Take 1 mg by mouth 4 (four) times daily.    Yes Historical Provider, MD  clindamycin (CLEOCIN) 300 MG capsule Take 1 capsule (300 mg total) by mouth 3 (three) times daily. Patient taking differently: Take 300 mg by mouth 3 (three) times  daily. 7 day course started 09/28/14 09/28/14  Yes Ozella Rocks, MD  ibuprofen (ADVIL,MOTRIN) 200 MG tablet Take 400-800 mg by mouth 2 (two) times daily as needed (pain).   Yes Historical Provider, MD  ibuprofen (ADVIL,MOTRIN) 800 MG tablet Take 1 tablet (800 mg total) by mouth every 8 (eight) hours as needed for mild pain or moderate pain. Patient not taking: Reported on 10/01/2014 07/04/14   Trixie Dredge, PA-C  methocarbamol (ROBAXIN) 500 MG tablet Take 1 tablet (500 mg total) by mouth every 6 (six) hours as needed for muscle spasms (and pain). Patient not taking: Reported on 10/01/2014 07/04/14   Trixie Dredge, PA-C  ondansetron (ZOFRAN) 4 MG tablet Take 1 tablet (4 mg total) by mouth every 6 (six) hours. Patient not taking: Reported on 07/04/2014 05/12/14   Marlon Pel, PA-C  oxyCODONE-acetaminophen (PERCOCET/ROXICET) 5-325 MG per tablet Take 1-2 tablets by mouth every 6 (six) hours as needed for severe pain. Patient not taking: Reported on 07/04/2014 05/12/14   Marlon Pel, PA-C    Physical Exam: BP 121/70 mmHg  Pulse 66  Temp(Src) 98.2 F (36.8 C) (Oral)  Resp 20  SpO2 95%  General:  In mild acute distress.  Eyes: non icteric  ENT: dry mucous membranes  Neck: supple  Cardiovascular: RRR. No murmur.  Respiratory: No wheezing.  Abdomen: soft, tender in epigastric  area to palpation.  Skin:  No rash  Musculoskeletal:  No deformity Neurologic: A, O#3, no focal deficits.           Labs on Admission:  Basic Metabolic Panel:  Recent Labs Lab 10/01/14 1710  NA 141  K 3.9  CL 104  CO2 29  GLUCOSE 97  BUN <5*  CREATININE 0.84  CALCIUM 9.4   Liver Function Tests:  Recent Labs Lab 10/01/14 1710  AST 15  ALT 11*  ALKPHOS 98  BILITOT 0.6  PROT 6.9  ALBUMIN 4.0    Recent Labs Lab 10/01/14 1710 10/01/14 1837  LIPASE  --  152*  AMYLASE 123*  --    No results for input(s): AMMONIA in the last 168 hours. CBC:  Recent Labs Lab 10/01/14 1710  WBC 12.2*  NEUTROABS  9.8*  HGB 15.1  HCT 45.3  MCV 92.4  PLT 220   Cardiac Enzymes: No results for input(s): CKTOTAL, CKMB, CKMBINDEX, TROPONINI in the last 168 hours.  BNP (last 3 results) No results for input(s): BNP in the last 8760 hours.  ProBNP (last 3 results) No results for input(s): PROBNP in the last 8760 hours.  CBG: No results for input(s): GLUCAP in the last 168 hours.  Radiological Exams on Admission: Ct Abdomen Pelvis W Contrast  10/01/2014   CLINICAL DATA:  Epigastric abdominal pain radiating to the back for 1 day. Nausea and vomiting. History of pancreatitis.  EXAM: CT ABDOMEN AND PELVIS WITH CONTRAST  TECHNIQUE: Multidetector CT imaging of the abdomen and pelvis was performed using the standard protocol following bolus administration of intravenous contrast.  CONTRAST:  OMNIPAQUE IOHEXOL 300 MG/ML  SOLN  COMPARISON:  06/08/2013  FINDINGS: Minimal dependent atelectasis is noted in the lung bases.  The liver, gallbladder, spleen, adrenal glands, and kidneys have an unremarkable enhanced appearance. There is mild peripancreatic fat stranding, most noticeable about the tail. Pancreatic parenchyma demonstrates normal enhancement. No fluid collection is seen.  Oral contrast is present in the stomach and in nondilated loops of proximal and mid small bowel. There is no evidence of bowel obstruction. There is a moderate amount of colonic stool. No bowel wall thickening is seen. The appendix is not clearly identified, however no inflammatory changes are seen in the right lower quadrant.  No free fluid or enlarged lymph nodes are identified. No acute osseous abnormality is seen.  IMPRESSION: Mild peripancreatic inflammation consistent with acute pancreatitis. No fluid collection.   Electronically Signed   By: Sebastian Ache   On: 10/01/2014 21:22    EKG: Independently reviewed. No ischemic T/ST changes.   Assessment/Plan  Acute pancreatitis :  MRCP 2 years ago w/o clear etiology. No h/o gallstones.  Will check US abdomen  Patient denies drinking alcohol  Clindamycin is unlikely to cause pancreatitis Unknown etiology Keep NPO, PPI IV, zofran IV prn, morphine 4 mg prn pain.  IVF at 125 cc/hr  Possible tooth abscess:  Will need to check CT w/ contrast in am ( already had contrast for CT abdomen) Possibly need maxofacial surgery eval too Will start Unasyn 3 gm Q6H  DVT proph: [place] enoxaparin.   Consultants: GI in am and Maxofacial surgery in am.   Code Status: code status   Family Communication: updated at bedside.   Disposition Plan: pending clinical improvement    Eston Esters MD [facility]

## 2014-10-03 MED ORDER — NICOTINE 21 MG/24HR TD PT24
21.0000 mg | MEDICATED_PATCH | Freq: Every day | TRANSDERMAL | Status: DC
  Administered 2014-10-03 – 2014-10-06 (×4): 21 mg via TRANSDERMAL
  Filled 2014-10-03 (×4): qty 1

## 2014-10-03 MED ORDER — PREDNISONE 50 MG PO TABS
50.0000 mg | ORAL_TABLET | Freq: Four times a day (QID) | ORAL | Status: AC
  Administered 2014-10-03 (×3): 50 mg via ORAL
  Filled 2014-10-03 (×3): qty 1

## 2014-10-03 MED ORDER — MORPHINE SULFATE 4 MG/ML IJ SOLN
6.0000 mg | INTRAMUSCULAR | Status: DC | PRN
  Administered 2014-10-03 (×6): 6 mg via INTRAVENOUS
  Administered 2014-10-04 – 2014-10-05 (×12): 8 mg via INTRAVENOUS
  Filled 2014-10-03 (×18): qty 2

## 2014-10-03 MED ORDER — DIPHENHYDRAMINE HCL 25 MG PO CAPS
50.0000 mg | ORAL_CAPSULE | Freq: Once | ORAL | Status: AC
  Administered 2014-10-03: 50 mg via ORAL
  Filled 2014-10-03: qty 2

## 2014-10-03 NOTE — Care Management Note (Signed)
Case Management Note  Patient Details  Name: Brent Morales. MRN: [id number] Date of Birth: 12-31-1975  Subjective/Objective:   39 y.o. F admitted from home where he lives with wife. C/O abdominal pain determined to be Pancreatitis by positive CT. Continues to have poor pain control and require IV abx.                  Action/Plan:Will continue to follow.    Expected Discharge Date:                  Expected Discharge Plan:     In-House Referral:     Discharge planning Services     Post Acute Care Choice:    Choice offered to:     DME Arranged:    DME Agency:     HH Arranged:    HH Agency:     Status of Service:     Medicare Important Message Given:    Date Medicare IM Given:    Medicare IM give by:    Date Additional Medicare IM Given:    Additional Medicare Important Message give by:     If discussed at Long Length of Stay Meetings, dates discussed:    Additional Comments:  Yvone Neu, RN 10/03/2014, 2:53 PM

## 2014-10-03 NOTE — ED Notes (Signed)
final report of oral abscess "no growth"

## 2014-10-03 NOTE — Progress Notes (Addendum)
[facility] PROGRESS NOTE  Assessment/Plan: Pancreatitis, acute: - He denies any alcohol abuse, his acute pancreatitis etiology remains unknown. - cont to requires high dose narcotics and frequently, will keep him NPO IV fluids to D5 half-normal saline.  Possible tooth abscess:  Cont IV unasyn. Will need Ct maxilla but allergic to contrast start allergy protocol.  Code Status: full Family Communication: none  Disposition Plan: home in am if pain improved.   Consultants:  none  Procedures:  Ct abd and pelvis  Antibiotics:  None  HPI/Subjective: Cont to have abd pain.  Objective: Filed Vitals:   10/02/14 1703 10/02/14 2144 10/03/14 0100 10/03/14 0659  BP: 141/80 108/60 99/60 113/55  Pulse: 57 65 61 57  Temp: 98 F (36.7 C) 98.9 F (37.2 C) 98.2 F (36.8 C) 98.1 F (36.7 C)  TempSrc: Oral Oral Oral Oral  Resp: 16 16 16 16   Height:      Weight:      SpO2: 99% 95% 96% 99%   No intake or output data in the 24 hours ending 10/03/14 1006 Filed Weights   10/02/14 0217  Weight: 70.988 kg (156 lb 8 oz)    Exam:  General: Alert, awake, oriented x3, in no acute distress.  HEENT: No bruits, no goiter.  Heart: Regular rate and rhythm, without murmurs, rubs, gallops.  Lungs: Good air movement, clear Abdomen: refused Neuro: Grossly intact, nonfocal.   Data Reviewed: Basic Metabolic Panel:  Recent Labs Lab 10/01/14 1710 10/02/14 0810  NA 141 141  K 3.9 4.3  CL 104 109  CO2 29 25  GLUCOSE 97 117*  BUN <5* <5*  CREATININE 0.84 0.65  CALCIUM 9.4 9.2   Liver Function Tests:  Recent Labs Lab 10/01/14 1710 10/02/14 0810  AST 15 14*  ALT 11* 9*  ALKPHOS 98 87  BILITOT 0.6 0.4  PROT 6.9 6.1*  ALBUMIN 4.0 3.2*    Recent Labs Lab 10/01/14 1710 10/01/14 1837  LIPASE  --  152*  AMYLASE 123*  --    No results for input(s): AMMONIA in the last 168 hours. CBC:  Recent Labs Lab 10/01/14 1710 10/02/14 0810  WBC 12.2* 7.9  NEUTROABS  9.8* 6.9  HGB 15.1 14.0  HCT 45.3 41.7  MCV 92.4 91.4  PLT 220 177   Cardiac Enzymes: No results for input(s): CKTOTAL, CKMB, CKMBINDEX, TROPONINI in the last 168 hours. BNP (last 3 results) No results for input(s): BNP in the last 8760 hours.  ProBNP (last 3 results) No results for input(s): PROBNP in the last 8760 hours.  CBG: No results for input(s): GLUCAP in the last 168 hours.  Recent Results (from the past 240 hour(s))  Culture, routine-abscess     Status: None   Collection Time: 09/28/14  4:37 PM  Result Value Ref Range Status   Specimen Description ABSCESS  Final   Special Requests HARD PALATE  Final   Gram Stain   Final    NO WBC SEEN NO SQUAMOUS EPITHELIAL CELLS SEEN NO ORGANISMS SEEN Performed at [facility]    Culture   Final    NO GROWTH 3 DAYS Performed at [facility]    Report Status 10/02/2014 FINAL  Final     Studies: Ct Abdomen Pelvis W Contrast  10/01/2014   CLINICAL DATA:  Epigastric abdominal pain radiating to the back for 1 day. Nausea and vomiting. History of pancreatitis.  EXAM: CT ABDOMEN AND PELVIS WITH CONTRAST  TECHNIQUE: Multidetector CT imaging of the  abdomen and pelvis was performed using the standard protocol following bolus administration of intravenous contrast.  CONTRAST:  OMNIPAQUE IOHEXOL 300 MG/ML  SOLN  COMPARISON:  06/08/2013  FINDINGS: Minimal dependent atelectasis is noted in the lung bases.  The liver, gallbladder, spleen, adrenal glands, and kidneys have an unremarkable enhanced appearance. There is mild peripancreatic fat stranding, most noticeable about the tail. Pancreatic parenchyma demonstrates normal enhancement. No fluid collection is seen.  Oral contrast is present in the stomach and in nondilated loops of proximal and mid small bowel. There is no evidence of bowel obstruction. There is a moderate amount of colonic stool. No bowel wall thickening is seen. The appendix is not clearly identified, however  no inflammatory changes are seen in the right lower quadrant.  No free fluid or enlarged lymph nodes are identified. No acute osseous abnormality is seen.  IMPRESSION: Mild peripancreatic inflammation consistent with acute pancreatitis. No fluid collection.   Electronically Signed   By: Sebastian Ache   On: 10/01/2014 21:22   US Abdomen Limited Ruq  10/02/2014   CLINICAL DATA:  Abdominal pain, pancreatitis  EXAM: US ABDOMEN LIMITED - RIGHT UPPER QUADRANT  COMPARISON:  None.  FINDINGS: Gallbladder:  No gallstones or wall thickening visualized. No sonographic Murphy sign noted.  Common bile duct:  Diameter: 3 mm  Liver:  No focal lesion identified. Within normal limits in parenchymal echogenicity.  IMPRESSION: No acute abnormality.   Electronically Signed   By: Christiana Pellant M.D.   On: 10/02/2014 10:40    Scheduled Meds: . ampicillin-sulbactam (UNASYN) IV  3 g Intravenous Q6H  . enoxaparin (LOVENOX) injection  40 mg Subcutaneous Q24H  . pantoprazole (PROTONIX) IV  40 mg Intravenous Q24H   Continuous Infusions: . dextrose 5 % and 0.45% NaCl 100 mL/hr at 10/02/14 2249    Time Spent: 25 min   Marinda Elk  [facility] Pager [phone]. If 7PM-7AM, please contact night-coverage at [email or web address], password [facility] 10/03/2014, 10:06 AM  LOS: 1 day

## 2014-10-04 ENCOUNTER — Inpatient Hospital Stay (HOSPITAL_COMMUNITY): Payer: Medicare Other

## 2014-10-04 ENCOUNTER — Encounter (HOSPITAL_COMMUNITY): Payer: Self-pay

## 2014-10-04 DIAGNOSIS — K859 Acute pancreatitis, unspecified: Secondary | ICD-10-CM

## 2014-10-04 DIAGNOSIS — K047 Periapical abscess without sinus: Secondary | ICD-10-CM

## 2014-10-04 MED ORDER — IOHEXOL 300 MG/ML  SOLN
75.0000 mL | Freq: Once | INTRAMUSCULAR | Status: AC | PRN
  Administered 2014-10-04: 75 mL via INTRAVENOUS

## 2014-10-04 NOTE — Consult Note (Signed)
DENTAL CONSULTATION  Date of Consultation:  10/04/2014 Patient Name:   Brent Morales. Date of Birth:   Oct 23, 1975 Medical Record Number: [id number]  VITALS: BP 133/69 mmHg  Pulse 50  Temp(Src) 98.1 F (36.7 C) (Oral)  Resp 18  Ht 6' (1.829 m)  Wt 156 lb 8 oz (70.988 kg)  BMI 21.22 kg/m2  SpO2 99%  CHIEF COMPLAINT: Patient referred for evaluation of left facial swelling.   HPI: Brent Morales. is a 39 year old male recently admitted and diagnosed with acute pancreatitis.  Patient referred by Dr. Radonna RickerRobb Matar for evaluation of left facial swelling and poor dentition. Patient is now seen to rule out dental infection that may further affect the patient's systemic health.  The patient has a history of acute dental pain involving " my whole mouth".  Patient describes having intermittent acute pain that is sharp and throbbing at various times. The patient indicates that multiple teeth are involved. Patient indicates the pain reached an intensity of 8 out of 10 but is currently 2-3 out of 10 in intensity. Patient was last seen by an oral surgeon who extracted an upper left molar sometime last year. Patient denies any complications from that dental extraction. Patient indicates that it was Dr. Egbert Garibaldi that performed the surgery. Prior to that, the patient had been seen a couple of years ago by a general dentist to have multiple teeth extracted as well. Patient has been unable to afford comprehensive dental care due to economic concerns.   PROBLEM LIST: Patient Active Problem List   Diagnosis Date Noted  . Mood disorder 03/01/2014  . Suicidal ideation   . Abscess of apex of dental root complicating chronic inflammation 01/26/2014  . Pancreatitis, acute 03/01/2013  . Abdominal pain 03/01/2013  . Tobacco abuse disorder 08/29/2011  . PERS HX NONCOMPLIANCE W/MED TX PRS HAZARDS HLTH 05/08/2009  . TOBACCO USE 03/13/2009  . LYME DISEASE, HX OF 11/02/2008  . ABDOMINAL PAIN OTHER SPECIFIED SITE  10/11/2008  . SPINAL STENOSIS, CERVICAL 05/02/2008  . ANXIETY 11/19/2007  . DRUG DEPENDENCE 04/10/2007  . Depression 08/12/2006    PMH: Past Medical History  Diagnosis Date  . Hernia     Bilateral Inguinal  . Cervical stenosis of spinal canal   . Pancreatitis     PSH: Past Surgical History  Procedure Laterality Date  . Inguinal hernia repair    . Thumb surgery      ALLERGIES: Allergies  Allergen Reactions  . Iohexol Other (See Comments)     Desc: PER MD/PER PATIENT NOT ALLERGIC 06/11/05 RM/PER RADIOLOGIST GIVE PREMEDICATION 06/12/05     MEDICATIONS: Current Facility-Administered Medications  Medication Dose Route Frequency Provider Last Rate Last Dose  . Ampicillin-Sulbactam (UNASYN) 3 g in sodium chloride 0.9 % 100 mL IVPB  3 g Intravenous Q6H Eston Esters, MD   3 g at 10/04/14 1456  . dextrose 5 %-0.45 % sodium chloride infusion   Intravenous Continuous Marinda Elk, MD 100 mL/hr at 10/04/14 1454    . enoxaparin (LOVENOX) injection 40 mg  40 mg Subcutaneous Q24H Eston Esters, MD   40 mg at 10/04/14 4098  . morphine 4 MG/ML injection 6-8 mg  6-8 mg Intravenous Q2H PRN Marinda Elk, MD   8 mg at 10/04/14 1458  . nicotine (NICODERM CQ - dosed in mg/24 hours) patch 21 mg  21 mg Transdermal Daily Marinda Elk, MD   21 mg at 10/04/14 0939  . ondansetron (ZOFRAN) injection 4 mg  4 mg Intravenous Q6H PRN Eston Esters, MD   4 mg at 10/04/14 1458  . pantoprazole (PROTONIX) injection 40 mg  40 mg Intravenous Q24H Eston Esters, MD   40 mg at 10/04/14 0157    LABS: Lab Results  Component Value Date   WBC 7.9 10/02/2014   HGB 14.0 10/02/2014   HCT 41.7 10/02/2014   MCV 91.4 10/02/2014   PLT 177 10/02/2014      Component Value Date/Time   NA 141 10/02/2014 0810   NA 141 07/20/2013 1356   K 4.3 10/02/2014 0810   K 3.8 07/20/2013 1356   CL 109 10/02/2014 0810   CL 107 07/20/2013 1356   CO2 25 10/02/2014 0810   CO2 26 07/20/2013 1356   GLUCOSE 117*  10/02/2014 0810   GLUCOSE 93 07/20/2013 1356   BUN <5* 10/02/2014 0810   BUN 4* 07/20/2013 1356   CREATININE 0.65 10/02/2014 0810   CREATININE 0.81 07/20/2013 1356   CALCIUM 9.2 10/02/2014 0810   CALCIUM 8.7 07/20/2013 1356   GFRNONAA >60 10/02/2014 0810   GFRNONAA >60 07/20/2013 1356   GFRAA >60 10/02/2014 0810   GFRAA >60 07/20/2013 1356   No results found for: INR, PROTIME No results found for: PTT  SOCIAL HISTORY: History   Social History  . Marital Status: Single    Spouse Name: N/A  . Number of Children: N/A  . Years of Education: N/A   Occupational History  . Not on file.   Social History Main Topics  . Smoking status: Current Every Day Smoker -- 1.00 packs/day for 25 years  . Smokeless tobacco: Never Used  . Alcohol Use: No  . Drug Use: No  . Sexual Activity: Not on file   Other Topics Concern  . Not on file   Social History Narrative    FAMILY HISTORY: Family History  Problem Relation Age of Onset  . Hypertension Mother   . Heart disease Father   . Crohn's disease Maternal Aunt   . Colon cancer Paternal Grandfather     REVIEW OF SYSTEMS: Reviewed from chart for this admission.   DENTAL HISTORY: CHIEF COMPLAINT: Patient referred for evaluation of left facial swelling.   HPI: Brent Morales. is a 39 year old male recently admitted and diagnosed with acute pancreatitis.  Patient referred by Dr. Radonna RickerRobb Matar for evaluation of left facial swelling and poor dentition. Patient is now seen to rule out dental infection that may further affect the patient's systemic health.  The patient has a history of acute dental pain involving " my whole mouth".  Patient describes having intermittent acute pain that is sharp and throbbing at various times. The patient indicates that multiple teeth are involved. Patient indicates the pain reached an intensity of 8 out of 10 but is currently 2-3 out of 10 in intensity. Patient was last seen by an oral surgeon who  extracted an upper left molar sometime last year. Patient denies any complications from that dental extraction. Patient indicates that it was Dr. Egbert Garibaldi that performed the surgery. Prior to that, the patient had been seen a couple of years ago by a general dentist to have multiple teeth extracted as well. Patient has been unable to afford comprehensive dental care due to economic concerns.  DENTAL EXAMINATION: GENERAL: The patient is a well-developed, slightly built male in no acute distress. HEAD AND NECK: Patient has left neck lymphadenopathy. Patient also has some residual left facial swelling. Patient denies acute TMJ symptoms. Patient has a  maximum interincisal opening of 40 mm plus. INTRAORAL EXAM: Patient has normal saliva. Patient has bilateral mandibular lingual tori. I do not see any evidence of oral abscess formation at this time. DENTITION: Patient has multiple missing teeth numbers 1, 3, 16, 17, 18, 29, 30, 31, and 32.  The patient has multiple retained root segments. PERIODONTAL: Patient has chronic periodontal disease with plaque and calculus accumulations, generalized gingival recession, and tooth mobility. DENTAL CARIES/SUBOPTIMAL RESTORATIONS: Patient has rampant dental caries affecting all remaining teeth. ENDODONTIC: Patient with a history of acute pulpitis symptoms. Patient has multiple areas of periapical pathology and radiolucency. CROWN AND BRIDGE: There are no crown or bridge restorations. PROSTHODONTIC: Patient denies having any partial dentures. OCCLUSION: Patient has a poor occlusal scheme secondary to multiple missing teeth, multiple retained root segments, and lack of replacement of missing teeth with dental prostheses.  RADIOGRAPHIC INTERPRETATION: Orthopantogram was taken by the department of radiology-today. There are multiple missing teeth. There are multiple retained root segments. There are rampant dental caries noted. There are multiple areas of periapical pathology  and radiolucency. There is supra-eruption and drifting of the unopposed teeth into the edentulous areas. There are radiopacities involving the lower arch consistent with bilateral mandibular lingual tori. The maxillary sinuses are noted and are well aerated.  ASSESSMENTS: 1. History of left facial swelling 2. History of acute pulpitis 3. Chronic apical periodontitis 4. Rampant dental caries 5. Chronic periodontitis with bone loss 6. Accretions 7. Gingival recession 8. Tooth mobility 9. Bilateral mandibular lingual tori 10. Poor occlusal scheme 11. History of oral neglect 12. Risk for bleeding with invasive dental procedures with current Lovenox therapy   PLAN/RECOMMENDATIONS: 1. I discussed the risks, benefits, and complications of various treatment options with the patient in relationship to his medical and dental conditions. We discussed various treatment options to include no treatment, multiple extractions with alveoloplasty, pre-prosthetic surgery as indicated, periodontal therapy, dental restorations, root canal therapy, crown and bridge therapy, implant therapy, and replacement of missing teeth as indicated. The patient currently wishes to proceed with extraction of remaining teeth with alveoloplasty and pre-prosthetic surgery as indicated in the operating room with general anesthesia. The patient will then follow-up with a dentist of his choice for fabrication of upper and lower complete dentures after adequate healing. I am currently unable to schedule this procedure until next week based on my schedule and operating room availability. After a discussion with Dr. David Stall, the patient will be discharged as indicated with appropriate oral antibiotics and then the patient will follow-up for extraction of remaining teeth with alveoloplasty and pre-prosthetic surgery as indicated in the operating room with general anesthesia on Thursday, 10/13/2014 at 7:30 AM at [facility] as an  outpatient.  2. Discussion of findings with medical team and coordination of future medical and dental care as needed.   Charlynne Pander, DDS

## 2014-10-04 NOTE — Care Management Important Message (Signed)
Important Message  Patient Details  Name: Brent Morales. MRN: [id number] Date of Birth: 07-10-1975   Medicare Important Message Given:  Yes-second notification given    Orson Aloe 10/04/2014, 2:01 PM

## 2014-10-04 NOTE — Progress Notes (Signed)
[facility] PROGRESS NOTE  Assessment/Plan: Pancreatitis, acute: - He denies any alcohol abuse, his acute pancreatitis etiology remains unknown. - cont to requires high dose narcotics and frequently, will keep him NPO IV fluids to D5 half-normal saline.  Possible tooth abscess:  Cont IV unasyn. Will need Ct maxilla showed multiple abscess. Consulted orthodontics consult.  Code Status: full Family Communication: none  Disposition Plan: 2-3 days   Consultants:  none  Procedures:  Ct abd and pelvis  Antibiotics:  None  HPI/Subjective: Pain improved.not hungry.  Objective: Filed Vitals:   10/03/14 2152 10/04/14 0125 10/04/14 0522 10/04/14 0931  BP: 114/67 126/62 106/56 133/69  Pulse: 61 58 54 50  Temp: 98.3 F (36.8 C) 97.9 F (36.6 C) 98.2 F (36.8 C) 98.1 F (36.7 C)  TempSrc: Oral Oral Oral Oral  Resp: 20 20 18 18   Height:      Weight:      SpO2: 95% 97% 96% 99%   No intake or output data in the 24 hours ending 10/04/14 1051 Filed Weights   10/02/14 0217  Weight: 70.988 kg (156 lb 8 oz)    Exam:  General: Alert, awake, oriented x3, in no acute distress.  HEENT: No bruits, no goiter.  Heart: Regular rate and rhythm, without murmurs, rubs, gallops.  Lungs: Good air movement, clear Abdomen: refused Neuro: Grossly intact, nonfocal.   Data Reviewed: Basic Metabolic Panel:  Recent Labs Lab 10/01/14 1710 10/02/14 0810  NA 141 141  K 3.9 4.3  CL 104 109  CO2 29 25  GLUCOSE 97 117*  BUN <5* <5*  CREATININE 0.84 0.65  CALCIUM 9.4 9.2   Liver Function Tests:  Recent Labs Lab 10/01/14 1710 10/02/14 0810  AST 15 14*  ALT 11* 9*  ALKPHOS 98 87  BILITOT 0.6 0.4  PROT 6.9 6.1*  ALBUMIN 4.0 3.2*    Recent Labs Lab 10/01/14 1710 10/01/14 1837  LIPASE  --  152*  AMYLASE 123*  --    No results for input(s): AMMONIA in the last 168 hours. CBC:  Recent Labs Lab 10/01/14 1710 10/02/14 0810  WBC 12.2* 7.9  NEUTROABS 9.8*  6.9  HGB 15.1 14.0  HCT 45.3 41.7  MCV 92.4 91.4  PLT 220 177   Cardiac Enzymes: No results for input(s): CKTOTAL, CKMB, CKMBINDEX, TROPONINI in the last 168 hours. BNP (last 3 results) No results for input(s): BNP in the last 8760 hours.  ProBNP (last 3 results) No results for input(s): PROBNP in the last 8760 hours.  CBG: No results for input(s): GLUCAP in the last 168 hours.  Recent Results (from the past 240 hour(s))  Culture, routine-abscess     Status: None   Collection Time: 09/28/14  4:37 PM  Result Value Ref Range Status   Specimen Description ABSCESS  Final   Special Requests HARD PALATE  Final   Gram Stain   Final    NO WBC SEEN NO SQUAMOUS EPITHELIAL CELLS SEEN NO ORGANISMS SEEN Performed at [facility]    Culture   Final    NO GROWTH 3 DAYS Performed at [facility]    Report Status 10/02/2014 FINAL  Final     Studies: Ct Maxillofacial W/cm  10/04/2014   CLINICAL DATA:  Bilateral facial swelling of the mandible, worse on the left. Possible tooth abscess.  EXAM: CT MAXILLOFACIAL WITH CONTRAST  TECHNIQUE: Multidetector CT imaging of the maxillofacial structures was performed with intravenous contrast. Multiplanar CT image reconstructions were also generated.  A small metallic BB was placed on the right temple in order to reliably differentiate right from left.  CONTRAST:  75mL OMNIPAQUE IOHEXOL 300 MG/ML  SOLN  COMPARISON:  CT of the head 07/04/2014.  CT of the face 01/27/2014.  FINDINGS: Prominent dental caries are present and every remaining tooth of the maxilla bilaterally. The caries have eroded the entire crown of the residual right maxillary molar in the majority of the ground in the first left maxillary molar. Adjacent to the left first maxillary molar is gas within the buccal gingiva. A subperiosteal abscess posteriorly along the left maxilla measures 6 x 16 mm. Despite the large caries on the right common no definite abscess is present on the  right.  Superficial soft tissue swelling is present over the face, left greater than right without other focal abscess. No significant sinus disease is present.  Multiple dental caries are present in the residual mandibular teeth as well with sparing of the left central incisor.  The nasal septum is midline. The ostiomeatal complex is clear bilaterally.  The globes and orbits are intact. Limited imaging of the brain is within normal limits.  IMPRESSION: 1. Subperiosteal abscess adjacent to the left posterior maxilla associated with prominent dental caries. 2. Large dental caries in the residual right maxillary molar without an associated abscess. 3. Extensive soft tissue swelling over the face, likely related to the acute dental processes, left greater than right. 4. Extensive dental caries throughout the residual teeth with sparing of the left central mandibular incisor.   Electronically Signed   By: Marin Roberts M.D.   On: 10/04/2014 07:19    Scheduled Meds: . ampicillin-sulbactam (UNASYN) IV  3 g Intravenous Q6H  . enoxaparin (LOVENOX) injection  40 mg Subcutaneous Q24H  . nicotine  21 mg Transdermal Daily  . pantoprazole (PROTONIX) IV  40 mg Intravenous Q24H   Continuous Infusions: . dextrose 5 % and 0.45% NaCl 100 mL/hr (10/04/14 0152)    Time Spent: 25 min   Marinda Elk  [facility] Pager [phone]. If 7PM-7AM, please contact night-coverage at [email or web address], password [facility] 10/04/2014, 10:51 AM  LOS: 2 days

## 2014-10-05 MED ORDER — MORPHINE SULFATE 4 MG/ML IJ SOLN
4.0000 mg | INTRAMUSCULAR | Status: DC | PRN
  Administered 2014-10-05 – 2014-10-06 (×5): 4 mg via INTRAVENOUS
  Filled 2014-10-05 (×5): qty 1

## 2014-10-05 NOTE — Progress Notes (Addendum)
[facility] PROGRESS NOTE  Assessment/Plan: Pancreatitis, acute: - He denies any alcohol abuse, his acute pancreatitis etiology remains unknown. - Start clear liq diet advance as tolerate it.  Possible tooth abscess:  Cont IV unasyn. Will need Ct maxilla showed multiple abscess. Consulted orthodontics follow up with surgery as an outpatient. - Thursday, 10/13/2014 at 7:30 AM follow up with dentist [place].  Code Status: full Family Communication: none  Disposition Plan: home ina am   Consultants:  none  Procedures:  Ct abd and pelvis  Antibiotics:  None  HPI/Subjective: Pain improved.  Objective: Filed Vitals:   10/04/14 2138 10/05/14 0226 10/05/14 0610 10/05/14 0921  BP: 124/63 117/66 113/65 107/60  Pulse: 51 46 48 49  Temp: 98.2 F (36.8 C) 98.1 F (36.7 C) 98.1 F (36.7 C) 98.4 F (36.9 C)  TempSrc: Oral Oral Oral Oral  Resp: Height:      Weight:      SpO2: 96% 96% 97% 98%   No intake or output data in the 24 hours ending 10/05/14 1327 Filed Weights   10/02/14 0217  Weight: 70.988 kg (156 lb 8 oz)    Exam:  General: Alert, awake, oriented x3, in no acute distress.  HEENT: No bruits, no goiter.  Heart: Regular rate and rhythm, without murmurs, rubs, gallops.  Lungs: Good air movement, clear Abdomen: refused Neuro: Grossly intact, nonfocal.   Data Reviewed: Basic Metabolic Panel:  Recent Labs Lab 10/01/14 1710 10/02/14 0810  NA 141 141  K 3.9 4.3  CL 104 109  CO2 29 25  GLUCOSE 97 117*  BUN <5* <5*  CREATININE 0.84 0.65  CALCIUM 9.4 9.2   Liver Function Tests:  Recent Labs Lab 10/01/14 1710 10/02/14 0810  AST 15 14*  ALT 11* 9*  ALKPHOS 98 87  BILITOT 0.6 0.4  PROT 6.9 6.1*  ALBUMIN 4.0 3.2*    Recent Labs Lab 10/01/14 1710 10/01/14 1837  LIPASE  --  152*  AMYLASE 123*  --    No results for input(s): AMMONIA in the last 168 hours. CBC:  Recent Labs Lab 10/01/14 1710 10/02/14 0810    WBC 12.2* 7.9  NEUTROABS 9.8* 6.9  HGB 15.1 14.0  HCT 45.3 41.7  MCV 92.4 91.4  PLT 220 177   Cardiac Enzymes: No results for input(s): CKTOTAL, CKMB, CKMBINDEX, TROPONINI in the last 168 hours. BNP (last 3 results) No results for input(s): BNP in the last 8760 hours.  ProBNP (last 3 results) No results for input(s): PROBNP in the last 8760 hours.  CBG: No results for input(s): GLUCAP in the last 168 hours.  Recent Results (from the past 240 hour(s))  Culture, routine-abscess     Status: None   Collection Time: 09/28/14  4:37 PM  Result Value Ref Range Status   Specimen Description ABSCESS  Final   Special Requests HARD PALATE  Final   Gram Stain   Final    NO WBC SEEN NO SQUAMOUS EPITHELIAL CELLS SEEN NO ORGANISMS SEEN Performed at [facility]    Culture   Final    NO GROWTH 3 DAYS Performed at [facility]    Report Status 10/02/2014 FINAL  Final     Studies: Dg Orthopantogram  10/04/2014   CLINICAL DATA:  Dental abscess.  EXAM: ORTHOPANTOGRAM/PANORAMIC  COMPARISON:  Maxillofacial CT obtained today.  FINDINGS: Multiple missing upper and lower teeth bilaterally, specially on the right. Large cavities in the majority of the remaining  teeth. There are small areas of periapical lucency involving multiple upper and lower teeth.  IMPRESSION: Severe dental disease with large cavities involving the majority of the remaining teeth with small periapical abscesses involving multiple upper and lower teeth.   Electronically Signed   By: Beckie Salts M.D.   On: 10/04/2014 14:32   Ct Maxillofacial W/cm  10/04/2014   CLINICAL DATA:  Bilateral facial swelling of the mandible, worse on the left. Possible tooth abscess.  EXAM: CT MAXILLOFACIAL WITH CONTRAST  TECHNIQUE: Multidetector CT imaging of the maxillofacial structures was performed with intravenous contrast. Multiplanar CT image reconstructions were also generated. A small metallic BB was placed on the right temple  in order to reliably differentiate right from left.  CONTRAST:  75mL OMNIPAQUE IOHEXOL 300 MG/ML  SOLN  COMPARISON:  CT of the head 07/04/2014.  CT of the face 01/27/2014.  FINDINGS: Prominent dental caries are present and every remaining tooth of the maxilla bilaterally. The caries have eroded the entire crown of the residual right maxillary molar in the majority of the ground in the first left maxillary molar. Adjacent to the left first maxillary molar is gas within the buccal gingiva. A subperiosteal abscess posteriorly along the left maxilla measures 6 x 16 mm. Despite the large caries on the right common no definite abscess is present on the right.  Superficial soft tissue swelling is present over the face, left greater than right without other focal abscess. No significant sinus disease is present.  Multiple dental caries are present in the residual mandibular teeth as well with sparing of the left central incisor.  The nasal septum is midline. The ostiomeatal complex is clear bilaterally.  The globes and orbits are intact. Limited imaging of the brain is within normal limits.  IMPRESSION: 1. Subperiosteal abscess adjacent to the left posterior maxilla associated with prominent dental caries. 2. Large dental caries in the residual right maxillary molar without an associated abscess. 3. Extensive soft tissue swelling over the face, likely related to the acute dental processes, left greater than right. 4. Extensive dental caries throughout the residual teeth with sparing of the left central mandibular incisor.   Electronically Signed   By: Marin Roberts M.D.   On: 10/04/2014 07:19    Scheduled Meds: . ampicillin-sulbactam (UNASYN) IV  3 g Intravenous Q6H  . enoxaparin (LOVENOX) injection  40 mg Subcutaneous Q24H  . nicotine  21 mg Transdermal Daily  . pantoprazole (PROTONIX) IV  40 mg Intravenous Q24H   Continuous Infusions: . dextrose 5 % and 0.45% NaCl 100 mL/hr at 10/05/14 1610    Time  Spent: 25 min   Marinda Elk  [facility] Pager [phone]. If 7PM-7AM, please contact night-coverage at [email or web address], password [facility] 10/05/2014, 1:27 PM  LOS: 3 days

## 2014-10-06 MED ORDER — NICOTINE 21 MG/24HR TD PT24: 21.0000 mg | MEDICATED_PATCH | Freq: Every day | TRANSDERMAL | Status: DC

## 2014-10-06 MED ORDER — AMOXICILLIN-POT CLAVULANATE 875-125 MG PO TABS: 1.0000 | ORAL_TABLET | Freq: Two times a day (BID) | ORAL | Status: DC

## 2014-10-06 NOTE — Progress Notes (Signed)
Patient is being d/c. Dc instructions given and patient verbalized understanding. 

## 2014-10-06 NOTE — Discharge Summary (Signed)
Physician Discharge Summary  Brent Morales Brent Morales. WUJ:[id number] DOB: December 06, 1975 DOA: 10/01/2014  PCP: No PCP Per Patient  Admit date: 10/01/2014 Discharge date: 10/06/2014  Time spent: 35 minutes  Recommendations for Outpatient Follow-up:  1. Follow up with dentist as an outpatient  Discharge Diagnoses:  Active Problems:   Pancreatitis, acute   Discharge Condition: stable  Diet recommendation: regular  Filed Weights   10/02/14 0217  Weight: 70.988 kg (156 lb 8 oz)    History of present illness:  39 y.o. male with previous history of pancreatitis and substance abuse who came with cc of acute abdominal pain started yesterday in epigastric area radiating to the back with nausea and vomiting ( non bloody). He denied cough, dyspnea, chest pain, fever, chills, diarrhea or constipation. He said he has had swelling and pain in his left upper gum/teeth and the swelling almost shot his left eye closed. He went to urgent care 2 days ago and was given clindamycin. The swelling went down after taking the antibiotics.   Hospital Course:  Pancreatitis, acute: - He denies any alcohol abuse, his acute pancreatitis etiology remains unknown. - Treated with nothing by mouth, IV fluids and narcotics once pain improved changed to diet nacotics stopped.  Possible tooth abscess:  Start on IV unasyn. Ct maxilla showed multiple abscess. Consulted orthodontics follow up with surgery as an outpatient. - Thursday, 10/13/2014 at 7:30 AM follow up with dentist Dalene Carrow  Procedures:  CT maxilla   orhopanogram  Consultations:  dental  Discharge Exam: Filed Vitals:   10/06/14 1046  BP: 129/74  Pulse: 62  Temp: 98.6 F (37 C)  Resp: 17    General: A&O x3 Cardiovascular: RRR Respiratory: good air movement CTA B/L  Discharge Instructions   Discharge Instructions    Diet - low sodium heart healthy    Complete by:  As directed      Increase activity slowly    Complete by:  As directed            Current Discharge Medication List    START taking these medications   Details  amoxicillin-clavulanate (AUGMENTIN) 875-125 MG per tablet Take 1 tablet by mouth 2 (two) times daily. Qty: 10 tablet, Refills: 0    nicotine (NICODERM CQ - DOSED IN MG/24 HOURS) 21 mg/24hr patch Place 1 patch (21 mg total) onto the skin daily. Qty: 28 patch, Refills: 0      CONTINUE these medications which have NOT CHANGED   Details  ALPRAZolam (XANAX) 1 MG tablet Take 1 mg by mouth 4 (four) times daily.     ibuprofen (ADVIL,MOTRIN) 200 MG tablet Take 400-800 mg by mouth 2 (two) times daily as needed (pain).      STOP taking these medications     clindamycin (CLEOCIN) 300 MG capsule      methocarbamol (ROBAXIN) 500 MG tablet      ondansetron (ZOFRAN) 4 MG tablet      oxyCODONE-acetaminophen (PERCOCET/ROXICET) 5-325 MG per tablet        Allergies  Allergen Reactions  . Iohexol Other (See Comments)     Desc: PER MD/PER PATIENT NOT ALLERGIC 06/11/05 RM/PER RADIOLOGIST GIVE PREMEDICATION 06/12/05       The results of significant diagnostics from this hospitalization (including imaging, microbiology, ancillary and laboratory) are listed below for reference.    Significant Diagnostic Studies: Dg Orthopantogram  10/04/2014   CLINICAL DATA:  Dental abscess.  EXAM: ORTHOPANTOGRAM/PANORAMIC  COMPARISON:  Maxillofacial CT obtained today.  FINDINGS: Multiple missing  upper and lower teeth bilaterally, specially on the right. Large cavities in the majority of the remaining teeth. There are small areas of periapical lucency involving multiple upper and lower teeth.  IMPRESSION: Severe dental disease with large cavities involving the majority of the remaining teeth with small periapical abscesses involving multiple upper and lower teeth.   Electronically Signed   By: Beckie Salts M.D.   On: 10/04/2014 14:32   Ct Abdomen Pelvis W Contrast  10/01/2014   CLINICAL DATA:  Epigastric abdominal pain radiating  to the back for 1 day. Nausea and vomiting. History of pancreatitis.  EXAM: CT ABDOMEN AND PELVIS WITH CONTRAST  TECHNIQUE: Multidetector CT imaging of the abdomen and pelvis was performed using the standard protocol following bolus administration of intravenous contrast.  CONTRAST:  OMNIPAQUE IOHEXOL 300 MG/ML  SOLN  COMPARISON:  06/08/2013  FINDINGS: Minimal dependent atelectasis is noted in the lung bases.  The liver, gallbladder, spleen, adrenal glands, and kidneys have an unremarkable enhanced appearance. There is mild peripancreatic fat stranding, most noticeable about the tail. Pancreatic parenchyma demonstrates normal enhancement. No fluid collection is seen.  Oral contrast is present in the stomach and in nondilated loops of proximal and mid small bowel. There is no evidence of bowel obstruction. There is a moderate amount of colonic stool. No bowel wall thickening is seen. The appendix is not clearly identified, however no inflammatory changes are seen in the right lower quadrant.  No free fluid or enlarged lymph nodes are identified. No acute osseous abnormality is seen.  IMPRESSION: Mild peripancreatic inflammation consistent with acute pancreatitis. No fluid collection.   Electronically Signed   By: Sebastian Ache   On: 10/01/2014 21:22   Ct Maxillofacial W/cm  10/04/2014   CLINICAL DATA:  Bilateral facial swelling of the mandible, worse on the left. Possible tooth abscess.  EXAM: CT MAXILLOFACIAL WITH CONTRAST  TECHNIQUE: Multidetector CT imaging of the maxillofacial structures was performed with intravenous contrast. Multiplanar CT image reconstructions were also generated. A small metallic BB was placed on the right temple in order to reliably differentiate right from left.  CONTRAST:  75mL OMNIPAQUE IOHEXOL 300 MG/ML  SOLN  COMPARISON:  CT of the head 07/04/2014.  CT of the face 01/27/2014.  FINDINGS: Prominent dental caries are present and every remaining tooth of the maxilla bilaterally.  The caries have eroded the entire crown of the residual right maxillary molar in the majority of the ground in the first left maxillary molar. Adjacent to the left first maxillary molar is gas within the buccal gingiva. A subperiosteal abscess posteriorly along the left maxilla measures 6 x 16 mm. Despite the large caries on the right common no definite abscess is present on the right.  Superficial soft tissue swelling is present over the face, left greater than right without other focal abscess. No significant sinus disease is present.  Multiple dental caries are present in the residual mandibular teeth as well with sparing of the left central incisor.  The nasal septum is midline. The ostiomeatal complex is clear bilaterally.  The globes and orbits are intact. Limited imaging of the brain is within normal limits.  IMPRESSION: 1. Subperiosteal abscess adjacent to the left posterior maxilla associated with prominent dental caries. 2. Large dental caries in the residual right maxillary molar without an associated abscess. 3. Extensive soft tissue swelling over the face, likely related to the acute dental processes, left greater than right. 4. Extensive dental caries throughout the residual teeth with sparing  of the left central mandibular incisor.   Electronically Signed   By: Marin Roberts M.D.   On: 10/04/2014 07:19   US Abdomen Limited Ruq  10/02/2014   CLINICAL DATA:  Abdominal pain, pancreatitis  EXAM: US ABDOMEN LIMITED - RIGHT UPPER QUADRANT  COMPARISON:  None.  FINDINGS: Gallbladder:  No gallstones or wall thickening visualized. No sonographic Murphy sign noted.  Common bile duct:  Diameter: 3 mm  Liver:  No focal lesion identified. Within normal limits in parenchymal echogenicity.  IMPRESSION: No acute abnormality.   Electronically Signed   By: Christiana Pellant M.D.   On: 10/02/2014 10:40    Microbiology: Recent Results (from the past 240 hour(s))  Culture, routine-abscess     Status: None    Collection Time: 09/28/14  4:37 PM  Result Value Ref Range Status   Specimen Description ABSCESS  Final   Special Requests HARD PALATE  Final   Gram Stain   Final    NO WBC SEEN NO SQUAMOUS EPITHELIAL CELLS SEEN NO ORGANISMS SEEN Performed at [facility]    Culture   Final    NO GROWTH 3 DAYS Performed at [facility]    Report Status 10/02/2014 FINAL  Final     Labs: Basic Metabolic Panel:  Recent Labs Lab 10/01/14 1710 10/02/14 0810  NA 141 141  K 3.9 4.3  CL 104 109  CO2 29 25  GLUCOSE 97 117*  BUN <5* <5*  CREATININE 0.84 0.65  CALCIUM 9.4 9.2   Liver Function Tests:  Recent Labs Lab 10/01/14 1710 10/02/14 0810  AST 15 14*  ALT 11* 9*  ALKPHOS 98 87  BILITOT 0.6 0.4  PROT 6.9 6.1*  ALBUMIN 4.0 3.2*    Recent Labs Lab 10/01/14 1710 10/01/14 1837  LIPASE  --  152*  AMYLASE 123*  --    No results for input(s): AMMONIA in the last 168 hours. CBC:  Recent Labs Lab 10/01/14 1710 10/02/14 0810  WBC 12.2* 7.9  NEUTROABS 9.8* 6.9  HGB 15.1 14.0  HCT 45.3 41.7  MCV 92.4 91.4  PLT 220 177   Cardiac Enzymes: No results for input(s): CKTOTAL, CKMB, CKMBINDEX, TROPONINI in the last 168 hours. BNP: BNP (last 3 results) No results for input(s): BNP in the last 8760 hours.  ProBNP (last 3 results) No results for input(s): PROBNP in the last 8760 hours.  CBG: No results for input(s): GLUCAP in the last 168 hours.     Signed:  Marinda Elk  [facility] 10/06/2014, 12:09 PM

## 2014-10-12 ENCOUNTER — Encounter (HOSPITAL_COMMUNITY): Payer: Self-pay | Admitting: *Deleted

## 2014-10-12 NOTE — Progress Notes (Signed)
Anesthesia Chart Review:  Pt is 39 year old male scheduled for multiple dental extractions with alveoloplasty, preprosthetic surgery, nasal tube on 10/13/2014 with Dr. Kristin Bruins.   Pt is same day work up.   PMH includes: syncope, pancreatitis. Former smoker (quit 2 weeks ago). BMI 21.   Pt hospitalized 7/23-7/28/16 for acute pancreatitis. Pt found to have poor dentition with multiple dental abscesses during this stay.   ED visit 07/12/14 for near-syncope. Pt reportedly felt like he was going to pass out due to pain.   ED visit 07/04/14 for syncope in the setting of intense coughing, opiate abuse and xanax use. Determined to be post-tussive associated syncope. EKG, CXR, head CT, labs (including d-dimer and troponin) unremarkable. 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   Medications include: xanax, augmentin, ibuprofen, nicoderm CQ patch.   By ED notes 07/04/14, pt using opiates not prescribed to him for pain. Urine drug screen 10/01/14 positive for benzodiazepines, opiates and marijuana.   Labs from prior hospitalization dated 10/02/14 reviewed. CBC and CMET acceptable for surgery.   Chest x-ray 07/04/2014 reviewed. No active cardiopulmonary disease.    EKG 10/01/2014: sinus rhythm. Incomplete RBBB.   If no changes, I anticipate pt can proceed with surgery as scheduled.   Rica Mast, FNP-BC [facility]/Anesthesiology Phone: [phone] 10/12/2014 1:28 PM

## 2014-10-12 NOTE — Progress Notes (Signed)
Pt denies SOB, chest pain, and being under the care of a cardiologist. Pt denies having a stress test, echo and cardiac cath. When asked the elaborate on episodes of syncope and near syncope in April and May, pt stated, " I passed out from being in so much pain, I almost passed out in May but didn't, I haven't passed out since April, they said it was because of my pain." Pt made aware to stop taking  Aspirin, otc vitamins and herbal medications such as fish oil. Do not take any NSAIDs ie: Ibuprofen, Advil, Naproxen or any medication containing Aspirin. Pt verbalized understanding of all pre-op instructions. Pt chart forwarded to [place], [place], anesthesia, to review history and EKG.

## 2014-10-13 ENCOUNTER — Ambulatory Visit (HOSPITAL_COMMUNITY): Payer: Medicare Other | Admitting: Emergency Medicine

## 2014-10-13 ENCOUNTER — Ambulatory Visit (HOSPITAL_COMMUNITY)
Admission: RE | Admit: 2014-10-13 | Discharge: 2014-10-13 | Disposition: A | Discharge status: Home / Self Care | Payer: Medicare Other | Source: Ambulatory Visit | Attending: Dentistry | Admitting: Dentistry

## 2014-10-13 ENCOUNTER — Encounter (HOSPITAL_COMMUNITY): Payer: Self-pay | Admitting: *Deleted

## 2014-10-13 ENCOUNTER — Encounter (HOSPITAL_COMMUNITY)
Admission: RE | Disposition: A | Discharge status: Home / Self Care | Payer: Self-pay | Source: Ambulatory Visit | Attending: Dentistry

## 2014-10-13 DIAGNOSIS — F1910 Other psychoactive substance abuse, uncomplicated: Secondary | ICD-10-CM | POA: Insufficient documentation

## 2014-10-13 DIAGNOSIS — M270 Developmental disorders of jaws: Secondary | ICD-10-CM

## 2014-10-13 DIAGNOSIS — K083 Retained dental root: Secondary | ICD-10-CM

## 2014-10-13 DIAGNOSIS — Z79899 Other long term (current) drug therapy: Secondary | ICD-10-CM | POA: Insufficient documentation

## 2014-10-13 DIAGNOSIS — F419 Anxiety disorder, unspecified: Secondary | ICD-10-CM | POA: Insufficient documentation

## 2014-10-13 DIAGNOSIS — Z87891 Personal history of nicotine dependence: Secondary | ICD-10-CM | POA: Diagnosis not present

## 2014-10-13 DIAGNOSIS — R51 Headache: Secondary | ICD-10-CM | POA: Diagnosis not present

## 2014-10-13 DIAGNOSIS — R069 Unspecified abnormalities of breathing: Secondary | ICD-10-CM | POA: Diagnosis not present

## 2014-10-13 DIAGNOSIS — K045 Chronic apical periodontitis: Secondary | ICD-10-CM | POA: Diagnosis not present

## 2014-10-13 DIAGNOSIS — K036 Deposits [accretions] on teeth: Secondary | ICD-10-CM | POA: Diagnosis not present

## 2014-10-13 DIAGNOSIS — K0530 Chronic periodontitis, unspecified: Secondary | ICD-10-CM | POA: Diagnosis not present

## 2014-10-13 DIAGNOSIS — Z79891 Long term (current) use of opiate analgesic: Secondary | ICD-10-CM | POA: Diagnosis not present

## 2014-10-13 DIAGNOSIS — K0401 Reversible pulpitis: Secondary | ICD-10-CM | POA: Diagnosis present

## 2014-10-13 DIAGNOSIS — R220 Localized swelling, mass and lump, head: Secondary | ICD-10-CM

## 2014-10-13 DIAGNOSIS — K029 Dental caries, unspecified: Secondary | ICD-10-CM

## 2014-10-13 DIAGNOSIS — Z791 Long term (current) use of non-steroidal anti-inflammatories (NSAID): Secondary | ICD-10-CM | POA: Insufficient documentation

## 2014-10-13 DIAGNOSIS — M797 Fibromyalgia: Secondary | ICD-10-CM | POA: Diagnosis not present

## 2014-10-13 HISTORY — PX: MULTIPLE EXTRACTIONS WITH ALVEOLOPLASTY: SHX5342

## 2014-10-13 HISTORY — DX: Depression, unspecified: F32.A

## 2014-10-13 HISTORY — DX: Syncope and collapse: R55

## 2014-10-13 HISTORY — DX: Anxiety disorder, unspecified: F41.9

## 2014-10-13 HISTORY — DX: Headache: R51

## 2014-10-13 HISTORY — DX: Major depressive disorder, single episode, unspecified: F32.9

## 2014-10-13 HISTORY — DX: Headache, unspecified: R51.9

## 2014-10-13 HISTORY — DX: Unspecified osteoarthritis, unspecified site: M19.90

## 2014-10-13 HISTORY — DX: Fibromyalgia: M79.7

## 2014-10-13 SURGERY — MULTIPLE EXTRACTION WITH ALVEOLOPLASTY
Anesthesia: General | Site: Mouth

## 2014-10-13 MED ORDER — NEOSTIGMINE METHYLSULFATE 10 MG/10ML IV SOLN
INTRAVENOUS | Status: DC | PRN
  Administered 2014-10-13: 4 mg via INTRAVENOUS

## 2014-10-13 MED ORDER — ONDANSETRON HCL 4 MG/2ML IJ SOLN
INTRAMUSCULAR | Status: AC
  Filled 2014-10-13: qty 2

## 2014-10-13 MED ORDER — MIDAZOLAM HCL 2 MG/2ML IJ SOLN
INTRAMUSCULAR | Status: AC
  Filled 2014-10-13: qty 4

## 2014-10-13 MED ORDER — MIDAZOLAM HCL 5 MG/5ML IJ SOLN
INTRAMUSCULAR | Status: DC | PRN
  Administered 2014-10-13: 1.5 mg via INTRAVENOUS
  Administered 2014-10-13: 0.5 mg via INTRAVENOUS

## 2014-10-13 MED ORDER — STERILE WATER FOR IRRIGATION IR SOLN
Status: DC | PRN
  Administered 2014-10-13: 1000 mL

## 2014-10-13 MED ORDER — PROPOFOL 10 MG/ML IV BOLUS
INTRAVENOUS | Status: AC
  Filled 2014-10-13: qty 20

## 2014-10-13 MED ORDER — OXYMETAZOLINE HCL 0.05 % NA SOLN
NASAL | Status: AC
  Filled 2014-10-13: qty 15

## 2014-10-13 MED ORDER — LACTATED RINGERS IV SOLN
INTRAVENOUS | Status: DC | PRN
  Administered 2014-10-13 (×2): via INTRAVENOUS

## 2014-10-13 MED ORDER — FENTANYL CITRATE (PF) 250 MCG/5ML IJ SOLN
INTRAMUSCULAR | Status: AC
  Filled 2014-10-13: qty 5

## 2014-10-13 MED ORDER — SUCCINYLCHOLINE CHLORIDE 20 MG/ML IJ SOLN
INTRAMUSCULAR | Status: AC
  Filled 2014-10-13: qty 1

## 2014-10-13 MED ORDER — DEXTROSE 5 % IV SOLN
INTRAVENOUS | Status: DC | PRN
  Administered 2014-10-13: 08:00:00 via INTRAVENOUS

## 2014-10-13 MED ORDER — PROMETHAZINE HCL 25 MG/ML IJ SOLN: 6.2500 mg | INTRAMUSCULAR | Status: DC | PRN

## 2014-10-13 MED ORDER — DEXAMETHASONE SODIUM PHOSPHATE 4 MG/ML IJ SOLN
INTRAMUSCULAR | Status: AC
  Filled 2014-10-13: qty 2

## 2014-10-13 MED ORDER — CEFAZOLIN SODIUM-DEXTROSE 2-3 GM-% IV SOLR
INTRAVENOUS | Status: DC | PRN
  Administered 2014-10-13: 2 g via INTRAVENOUS

## 2014-10-13 MED ORDER — BUPIVACAINE-EPINEPHRINE (PF) 0.5% -1:200000 IJ SOLN
INTRAMUSCULAR | Status: AC
  Filled 2014-10-13: qty 3.6

## 2014-10-13 MED ORDER — ROCURONIUM BROMIDE 50 MG/5ML IV SOLN
INTRAVENOUS | Status: AC
  Filled 2014-10-13: qty 1

## 2014-10-13 MED ORDER — NALOXONE HCL 0.4 MG/ML IJ SOLN
INTRAMUSCULAR | Status: DC | PRN
  Administered 2014-10-13: 40 ug via INTRAVENOUS

## 2014-10-13 MED ORDER — ONDANSETRON HCL 4 MG/2ML IJ SOLN
INTRAMUSCULAR | Status: DC | PRN
  Administered 2014-10-13: 4 mg via INTRAVENOUS

## 2014-10-13 MED ORDER — KETOROLAC TROMETHAMINE 30 MG/ML IJ SOLN
INTRAMUSCULAR | Status: AC
  Filled 2014-10-13: qty 1

## 2014-10-13 MED ORDER — PHENYLEPHRINE 40 MCG/ML (10ML) SYRINGE FOR IV PUSH (FOR BLOOD PRESSURE SUPPORT)
PREFILLED_SYRINGE | INTRAVENOUS | Status: AC
  Filled 2014-10-13: qty 10

## 2014-10-13 MED ORDER — KETOROLAC TROMETHAMINE 30 MG/ML IJ SOLN
INTRAMUSCULAR | Status: DC | PRN
  Administered 2014-10-13: 30 mg via INTRAVENOUS

## 2014-10-13 MED ORDER — GLYCOPYRROLATE 0.2 MG/ML IJ SOLN
INTRAMUSCULAR | Status: DC | PRN
  Administered 2014-10-13: 0.6 mg via INTRAVENOUS

## 2014-10-13 MED ORDER — 0.9 % SODIUM CHLORIDE (POUR BTL) OPTIME
TOPICAL | Status: DC | PRN
  Administered 2014-10-13: 1000 mL

## 2014-10-13 MED ORDER — PHENYLEPHRINE HCL 10 MG/ML IJ SOLN
INTRAMUSCULAR | Status: DC | PRN
  Administered 2014-10-13: 80 ug via INTRAVENOUS
  Administered 2014-10-13: 40 ug via INTRAVENOUS
  Administered 2014-10-13: 80 ug via INTRAVENOUS

## 2014-10-13 MED ORDER — FENTANYL CITRATE (PF) 100 MCG/2ML IJ SOLN
INTRAMUSCULAR | Status: DC | PRN
  Administered 2014-10-13 (×2): 50 ug via INTRAVENOUS

## 2014-10-13 MED ORDER — SUCCINYLCHOLINE CHLORIDE 20 MG/ML IJ SOLN
INTRAMUSCULAR | Status: DC | PRN
  Administered 2014-10-13: 100 mg via INTRAVENOUS

## 2014-10-13 MED ORDER — FENTANYL CITRATE (PF) 100 MCG/2ML IJ SOLN
INTRAMUSCULAR | Status: AC
  Administered 2014-10-13: 50 ug via INTRAVENOUS
  Filled 2014-10-13: qty 2

## 2014-10-13 MED ORDER — FENTANYL CITRATE (PF) 100 MCG/2ML IJ SOLN
25.0000 ug | INTRAMUSCULAR | Status: DC | PRN
  Administered 2014-10-13 (×2): 50 ug via INTRAVENOUS

## 2014-10-13 MED ORDER — OXYMETAZOLINE HCL 0.05 % NA SOLN: 1.0000 | NASAL | Status: DC

## 2014-10-13 MED ORDER — ROCURONIUM BROMIDE 100 MG/10ML IV SOLN
INTRAVENOUS | Status: DC | PRN
  Administered 2014-10-13: 30 mg via INTRAVENOUS

## 2014-10-13 MED ORDER — PROPOFOL 10 MG/ML IV BOLUS
INTRAVENOUS | Status: DC | PRN
  Administered 2014-10-13: 160 mg via INTRAVENOUS

## 2014-10-13 MED ORDER — LIDOCAINE-EPINEPHRINE 2 %-1:100000 IJ SOLN
INTRAMUSCULAR | Status: AC
  Filled 2014-10-13: qty 10.2

## 2014-10-13 MED ORDER — LIDOCAINE HCL (CARDIAC) 20 MG/ML IV SOLN
INTRAVENOUS | Status: AC
  Filled 2014-10-13: qty 5

## 2014-10-13 MED ORDER — DEXAMETHASONE SODIUM PHOSPHATE 4 MG/ML IJ SOLN
INTRAMUSCULAR | Status: DC | PRN
  Administered 2014-10-13: 8 mg via INTRAVENOUS

## 2014-10-13 MED ORDER — ISOPROPYL ALCOHOL 70 % SOLN
Status: DC | PRN
  Administered 2014-10-13: 1 via TOPICAL

## 2014-10-13 MED ORDER — GLYCOPYRROLATE 0.2 MG/ML IJ SOLN
INTRAMUSCULAR | Status: AC
  Filled 2014-10-13: qty 3

## 2014-10-13 MED ORDER — BUPIVACAINE-EPINEPHRINE 0.5% -1:200000 IJ SOLN
INTRAMUSCULAR | Status: DC | PRN
  Administered 2014-10-13 (×2): 1.8 mL

## 2014-10-13 MED ORDER — LIDOCAINE-EPINEPHRINE 2 %-1:100000 IJ SOLN
INTRAMUSCULAR | Status: DC | PRN
  Administered 2014-10-13 (×6): 1.7 mL

## 2014-10-13 MED ORDER — OXYMETAZOLINE HCL 0.05 % NA SOLN
NASAL | Status: DC | PRN
  Administered 2014-10-13: 1

## 2014-10-13 MED ORDER — OXYCODONE-ACETAMINOPHEN 5-325 MG PO TABS: ORAL_TABLET | ORAL | Status: DC

## 2014-10-13 SURGICAL SUPPLY — 33 items
ALCOHOL 70% 16 OZ (MISCELLANEOUS) ×3 IMPLANT
ATTRACTOMAT 16X20 MAGNETIC DRP (DRAPES) ×3 IMPLANT
BLADE SURG 15 STRL LF DISP TIS (BLADE) ×2 IMPLANT
BLADE SURG 15 STRL SS (BLADE) ×4
COVER SURGICAL LIGHT HANDLE (MISCELLANEOUS) ×3 IMPLANT
GAUZE PACKING FOLDED 2  STR (GAUZE/BANDAGES/DRESSINGS) ×2
GAUZE PACKING FOLDED 2 STR (GAUZE/BANDAGES/DRESSINGS) ×1 IMPLANT
GAUZE SPONGE 4X4 16PLY XRAY LF (GAUZE/BANDAGES/DRESSINGS) ×6 IMPLANT
GLOVE BIOGEL PI IND STRL 6 (GLOVE) ×1 IMPLANT
GLOVE BIOGEL PI INDICATOR 6 (GLOVE) ×2
GLOVE SURG ORTHO 8.0 STRL STRW (GLOVE) ×3 IMPLANT
GLOVE SURG SS PI 6.0 STRL IVOR (GLOVE) ×3 IMPLANT
GOWN STRL REUS W/ TWL LRG LVL3 (GOWN DISPOSABLE) ×1 IMPLANT
GOWN STRL REUS W/TWL 2XL LVL3 (GOWN DISPOSABLE) ×3 IMPLANT
GOWN STRL REUS W/TWL LRG LVL3 (GOWN DISPOSABLE) ×2
HEMOSTAT SURGICEL 2X14 (HEMOSTASIS) IMPLANT
KIT BASIN OR (CUSTOM PROCEDURE TRAY) ×3 IMPLANT
KIT ROOM TURNOVER OR (KITS) ×3 IMPLANT
MANIFOLD NEPTUNE WASTE (CANNULA) ×3 IMPLANT
NEEDLE BLUNT 16X1.5 OR ONLY (NEEDLE) ×3 IMPLANT
NS IRRIG 1000ML POUR BTL (IV SOLUTION) ×3 IMPLANT
PACK EENT II TURBAN DRAPE (CUSTOM PROCEDURE TRAY) ×3 IMPLANT
PAD ARMBOARD 7.5X6 YLW CONV (MISCELLANEOUS) ×6 IMPLANT
SPONGE SURGIFOAM ABS GEL 100 (HEMOSTASIS) IMPLANT
SPONGE SURGIFOAM ABS GEL 12-7 (HEMOSTASIS) IMPLANT
SPONGE SURGIFOAM ABS GEL SZ50 (HEMOSTASIS) IMPLANT
SUCTION FRAZIER TIP 10 FR DISP (SUCTIONS) ×3 IMPLANT
SUT CHROMIC 3 0 PS 2 (SUTURE) ×12 IMPLANT
SYR 50ML SLIP (SYRINGE) ×3 IMPLANT
TOWEL OR 17X26 10 PK STRL BLUE (TOWEL DISPOSABLE) ×3 IMPLANT
TUBE CONNECTING 12'X1/4 (SUCTIONS) ×1
TUBE CONNECTING 12X1/4 (SUCTIONS) ×2 IMPLANT
YANKAUER SUCT BULB TIP NO VENT (SUCTIONS) ×3 IMPLANT

## 2014-10-13 NOTE — Anesthesia Postprocedure Evaluation (Signed)
  Anesthesia Post-op Note  Patient: Brent Morales.  Procedure(s) Performed: Procedure(s) (LRB): Extraction of tooth #'s 2,4,5,6,7,8,9,10,11,12,13,14,15, [id number] with alveoloplasty and bilateral mandibular tori reductions (N/A)  Patient Location: PACU  Anesthesia Type: General  Level of Consciousness: awake and alert   Airway and Oxygen Therapy: Patient Spontanous Breathing  Post-op Pain: mild  Post-op Assessment: Post-op Vital signs reviewed, Patient's Cardiovascular Status Stable, Respiratory Function Stable, Patent Airway and No signs of Nausea or vomiting  Last Vitals:  Filed Vitals:   10/13/14 1115  BP:   Pulse: 63  Temp:   Resp: 11    Post-op Vital Signs: stable   Complications: No apparent anesthesia complications

## 2014-10-13 NOTE — H&P (Signed)
10/13/2014  Patient:            Josh[place] Code Fraleigh Montez Hageman. Date of Birth:  06-Jan-1976 MRN:                [id number]   BP 114/65 mmHg  Pulse 58  Temp(Src) 97.5 F (36.4 C) (Oral)  Resp 18  Wt 156 lb (70.761 kg)  SpO2 97%  [name]. presents for multiple dental extractions with alveoloplasty in the operating room with general anesthesia. Please see previous H&P from 10/04/2058 previous's H&P for the dental operating room procedure. Patient denies having any acute medical or dental changes since his discharge. Charlynne Pander, DDS  History and Physical  [name] Hurd Montez Hageman. UUV:[id number] DOB: [birth year] DOA: 10/01/2014   PCP: No PCP Per Patient   Chief Complaint: abdominal pain  HPI: [name] Furness Montez Hageman. is a 39 y.o. male with previous history of pancreatitis and substance abuse who came with cc of acute abdominal pain started yesterday in epigastric area radiating to the back with nausea and vomiting ( non bloody). He denied cough, dyspnea, chest pain, fever, chills, diarrhea or constipation. He said he has had swelling and pain in his left upper gum/teeth and the swelling almost shot his left eye closed. He went to urgent care 2 days ago and was given clindamycin. The swelling went down after taking the antibiotics.     Review of Systems:  A 12 point review of systems was negative except as per HPI.    Past Medical History  Diagnosis Date  . Hernia     Bilateral Inguinal  . Cervical stenosis of spinal canal   . Pancreatitis    Past Surgical History  Procedure Laterality Date  . Inguinal hernia repair    . Thumb surgery     Social History:  reports that he has been smoking. He has never used smokeless tobacco. He reports that he does not drink alcohol or use illicit drugs. Patient lives at home with wife.   Allergies  Allergen Reactions  . Iohexol Other (See Comments)    Desc: PER MD/PER PATIENT NOT ALLERGIC 06/11/05 RM/PER RADIOLOGIST  GIVE PREMEDICATION 06/12/05     Family History  Problem Relation Age of Onset  . Hypertension Mother   . Heart disease Father   . Crohn's disease Maternal Aunt   . Colon cancer Paternal Grandfather       Prior to Admission medications   Medication Sig Start Date End Date Taking? Authorizing Provider  ALPRAZolam Prudy Feeler) 1 MG tablet Take 1 mg by mouth 4 (four) times daily.    Yes Historical Provider, MD  clindamycin (CLEOCIN) 300 MG capsule Take 1 capsule (300 mg total) by mouth 3 (three) times daily. Patient taking differently: Take 300 mg by mouth 3 (three) times daily. 7 day course started 09/28/14 09/28/14  Yes Ozella Rocks, MD  ibuprofen (ADVIL,MOTRIN) 200 MG tablet Take 400-800 mg by mouth 2 (two) times daily as needed (pain).   Yes Historical Provider, MD  ibuprofen (ADVIL,MOTRIN) 800 MG tablet Take 1 tablet (800 mg total) by mouth every 8 (eight) hours as needed for mild pain or moderate pain. Patient not taking: Reported on 10/01/2014 07/04/14   Trixie Dredge, PA-C  methocarbamol (ROBAXIN) 500 MG tablet Take 1 tablet (500 mg total) by mouth every 6 (six) hours as needed for muscle spasms (and pain). Patient not taking: Reported on 10/01/2014 07/04/14   Trixie Dredge, PA-C  ondansetron (ZOFRAN) 4 MG tablet  Take 1 tablet (4 mg total) by mouth every 6 (six) hours. Patient not taking: Reported on 07/04/2014 05/12/14   Marlon Pel, PA-C  oxyCODONE-acetaminophen (PERCOCET/ROXICET) 5-325 MG per tablet Take 1-2 tablets by mouth every 6 (six) hours as needed for severe pain. Patient not taking: Reported on 07/04/2014 05/12/14   Marlon Pel, PA-C    Physical Exam: BP 121/70 mmHg  Pulse 66  Temp(Src) 98.2 F (36.8 C) (Oral)  Resp 20  SpO2 95%   General: In mild acute distress.   Eyes: non icteric   ENT: dry mucous membranes   Neck: supple   Cardiovascular: RRR. No murmur.   Respiratory: No wheezing.    Abdomen: soft, tender in epigastric area to palpation.   Skin: No rash   Musculoskeletal: No deformity  Neurologic: A, O#3, no focal deficits.           Labs on Admission:  Basic Metabolic Panel:  Last Labs      Recent Labs Lab 10/01/14 1710  NA 141  K 3.9  CL 104  CO2 29  GLUCOSE 97  BUN <5*  CREATININE 0.84  CALCIUM 9.4     Liver Function Tests:  Last Labs      Recent Labs Lab 10/01/14 1710  AST 15  ALT 11*  ALKPHOS 98  BILITOT 0.6  PROT 6.9  ALBUMIN 4.0      Last Labs      Recent Labs Lab 10/01/14 1710 10/01/14 1837  LIPASE --  152*  AMYLASE 123* --       Last Labs     No results for input(s): AMMONIA in the last 168 hours.   CBC:  Last Labs      Recent Labs Lab 10/01/14 1710  WBC 12.2*  NEUTROABS 9.8*  HGB 15.1  HCT 45.3  MCV 92.4  PLT 220     Cardiac Enzymes:  Last Labs     No results for input(s): CKTOTAL, CKMB, CKMBINDEX, TROPONINI in the last 168 hours.    BNP (last 3 results)  Recent Labs (within last 365 days)    No results for input(s): BNP in the last 8760 hours.    ProBNP (last 3 results)  Recent Labs (within last 365 days)    No results for input(s): PROBNP in the last 8760 hours.    CBG:  Last Labs     No results for input(s): GLUCAP in the last 168 hours.    Radiological Exams on Admission:  Imaging Results (Last 48 hours)    Ct Abdomen Pelvis W Contrast  10/01/2014 CLINICAL DATA: Epigastric abdominal pain radiating to the back for 1 day. Nausea and vomiting. History of pancreatitis. EXAM: CT ABDOMEN AND PELVIS WITH CONTRAST TECHNIQUE: Multidetector CT imaging of the abdomen and pelvis was performed using the standard protocol following bolus administration of intravenous contrast. CONTRAST: OMNIPAQUE IOHEXOL 300 MG/ML SOLN COMPARISON: 06/08/2013 FINDINGS: Minimal dependent atelectasis is noted in the lung bases. The  liver, gallbladder, spleen, adrenal glands, and kidneys have an unremarkable enhanced appearance. There is mild peripancreatic fat stranding, most noticeable about the tail. Pancreatic parenchyma demonstrates normal enhancement. No fluid collection is seen. Oral contrast is present in the stomach and in nondilated loops of proximal and mid small bowel. There is no evidence of bowel obstruction. There is a moderate amount of colonic stool. No bowel wall thickening is seen. The appendix is not clearly identified, however no inflammatory changes are seen in the right lower quadrant. No free fluid  or enlarged lymph nodes are identified. No acute osseous abnormality is seen. IMPRESSION: Mild peripancreatic inflammation consistent with acute pancreatitis. No fluid collection. Electronically Signed By: Sebastian Ache On: 10/01/2014 21:22     EKG: Independently reviewed. No ischemic T/ST changes.   Assessment/Plan  Acute pancreatitis :  MRCP 2 years ago w/o clear etiology. No h/o gallstones. Will check US abdomen  Patient denies drinking alcohol  Clindamycin is unlikely to cause pancreatitis Unknown etiology Keep NPO, PPI IV, zofran IV prn, morphine 4 mg prn pain.  IVF at 125 cc/hr  Possible tooth abscess:  Will need to check CT w/ contrast in am ( already had contrast for CT abdomen) Possibly need maxofacial surgery eval too Will start Unasyn 3 gm Q6H  DVT proph: [place] enoxaparin.   Consultants: GI in am and Maxofacial surgery in am.   Code Status: code status   Family Communication: updated at bedside.   Disposition Plan: pending clinical improvement    Eston Esters MD [facility]

## 2014-10-13 NOTE — Anesthesia Procedure Notes (Addendum)
Procedure Name: Intubation Date/Time: 10/13/2014 7:40 AM Performed by: Marni Griffon Pre-anesthesia Checklist: Patient identified, Emergency Drugs available, Suction available and Patient being monitored Patient Re-evaluated:Patient Re-evaluated prior to inductionOxygen Delivery Method: Circle system utilized Preoxygenation: Pre-oxygenation with 100% oxygen Intubation Type: IV induction Ventilation: Mask ventilation without difficulty Laryngoscope Size: Mac and 3 (could have used a 4 Mac...) Grade View: Grade II Nasal Tubes: Right, Nasal prep performed and Magill forceps- large, utilized Tube size: 7.5 mm Number of attempts: 1 Intubation method: Magill Forceps to advance; cuff just reached past cords... Placement Confirmation: ETT inserted through vocal cords under direct vision,  breath sounds checked- equal and bilateral and positive ETCO2 Tube secured with: Tape (taped across nose) Dental Injury: Teeth and Oropharynx as per pre-operative assessment  Comments: Able to mask despite beard

## 2014-10-13 NOTE — Transfer of Care (Signed)
Immediate Anesthesia Transfer of Care Note  Patient: Brent Morales.  Procedure(s) Performed: Procedure(s) with comments: Extraction of tooth #'s 2,4,5,6,7,8,9,10,11,12,13,14,15, [phone] with alveoloplasty and bilateral mandibular tori reductions (N/A) - NASAL TUBE  Patient Location: PACU  Anesthesia Type:General  Level of Consciousness: awake, alert  and patient cooperative  Airway & Oxygen Therapy: Patient Spontanous Breathing and Patient connected to nasal cannula oxygen  Post-op Assessment: Report given to RN, Post -op Vital signs reviewed and stable and Patient moving all extremities  Post vital signs: Reviewed and stable  Last Vitals:  Filed Vitals:   10/13/14 1000  BP: 127/75  Pulse: 77  Temp:   Resp: 10    Complications: No apparent anesthesia complications

## 2014-10-13 NOTE — Progress Notes (Signed)
PRE-OPERATIVE NOTE:  10/13/2014   Brent Libman Spicher Jr. [id number]  VITALS: BP 114/65 mmHg  Pulse 58  Temp(Src) 97.5 F (36.4 C) (Oral)  Resp 18  Wt 156 lb (70.761 kg)  SpO2 97%  Lab Results  Component Value Date   WBC 7.9 10/02/2014   HGB 14.0 10/02/2014   HCT 41.7 10/02/2014   MCV 91.4 10/02/2014   PLT 177 10/02/2014   BMET    Component Value Date/Time   NA 141 10/02/2014 0810   NA 141 07/20/2013 1356   K 4.3 10/02/2014 0810   K 3.8 07/20/2013 1356   CL 109 10/02/2014 0810   CL 107 07/20/2013 1356   CO2 25 10/02/2014 0810   CO2 26 07/20/2013 1356   GLUCOSE 117* 10/02/2014 0810   GLUCOSE 93 07/20/2013 1356   BUN <5* 10/02/2014 0810   BUN 4* 07/20/2013 1356   CREATININE 0.65 10/02/2014 0810   CREATININE 0.81 07/20/2013 1356   CALCIUM 9.2 10/02/2014 0810   CALCIUM 8.7 07/20/2013 1356   GFRNONAA >60 10/02/2014 0810   GFRNONAA >60 07/20/2013 1356   GFRAA >60 10/02/2014 0810   GFRAA >60 07/20/2013 1356    No results found for: INR, PROTIME No results found for: PTT   Brent Libman Delaluz Jr. presents for extraction of remaining teeth with alveoloplasty and pre-prosthetic surgery as indicated the operating room with general anesthesia.   SUBJECTIVE: The patient denies any acute medical or dental changes since his discharge and agrees to proceed with treatment as planned.  EXAM: No sign of acute dental changes.  ASSESSMENT: Patient is affected by history of facial swelling, history of acute pulpitis, chronic apical periodontitis, multiple retained root segments, dental caries, bilateral mandibular lingual tori, chronic periodontitis, and accretions.  PLAN: Patient agrees to proceed with treatment as planned in the operating room as previously discussed and accepts the risks, benefits, and complications of the proposed treatment. Patient is aware of the risk for bleeding, bruising, swelling, infection, pain, nerve damage, soft tissue damage, sinus involvement, root tip  fracture, mandible fracture, and the risks of complications associated with the anesthesia. Patient also is aware of the potential for other complications not mentioned above.   Charlynne Pander, DDS

## 2014-10-13 NOTE — Addendum Note (Signed)
Addendum  created 10/13/14 1700 by Marni Griffon, CRNA   Modules edited: Anesthesia Events, Narrator   Narrator:  Narrator: Event Log Edited

## 2014-10-13 NOTE — Discharge Instructions (Signed)

## 2014-10-13 NOTE — Op Note (Signed)
OPERATIVE REPORT  Patient:            Brent Morales. Date of Birth:  09/04/1975 MRN:                [id number]   DATE OF PROCEDURE:  10/13/2014  PREOPERATIVE DIAGNOSES: 1. History of facial swelling 2. History of acute pulpitis 3. Chronic apical periodontitis 4. Multiple retained root segments 5. Rampant dental caries 6. Chronic periodontitis 7. Bilateral mandibular lingual tori   POSTOPERATIVE DIAGNOSES: 1. History of facial swelling 2. History of acute pulpitis 3. Chronic apical periodontitis 4. Multiple retained root segments 5. Rampant dental caries 6. Chronic periodontitis 7. Bilateral mandibular lingual tori   OPERATIONS: 1. Multiple extraction of tooth numbers 2, 4, 5, 6, 7, 8, 9, 10, 11, 12, 13, 14, 15, 19, 20, 21, 22, 23, 24, 25, 26, 27, and 28 2. 4 Quadrants of alveoloplasty 3. Bilateral mandibular lingual tori reductions   SURGEON: Charlynne Pander, DDS  ASSISTANT: Rory Percy, (dental assistant)  ANESTHESIA: General anesthesia via nasoendotracheal tube.  MEDICATIONS: 1. Ancef 2 g IV prior to invasive dental procedures. 2. Local anesthesia with a total utilization of 6 carpules each containing 34 mg of lidocaine with 0.017 mg of epinephrine as well as 2 carpules each containing 9 mg of bupivacaine with 0.009 mg of epinephrine.  SPECIMENS: There are 23 teeth that were discarded.  DRAINS: None  CULTURES: None  COMPLICATIONS: None   ESTIMATED BLOOD LOSS: 100 mLs.  INTRAVENOUS FLUIDS: 1600 mLs of Lactated ringers solution.  INDICATIONS: The patient was recently diagnosed with facial swelling.  A dental consultation was then requested to evaluate poor dentition.  The patient was examined and treatment planned for extraction of remaining teeth with alveoloplasty and pre-prosthetic surgery as indicated in the operating room with general anesthesia.  This treatment plan was formulated to decrease the risks and complications associated with dental  infection from further affecting the patient's systemic health and to assist in prosthodontic rehabilitation.  OPERATIVE FINDINGS: Patient was examined operating room number 9.  The teeth were identified for extraction. The patient was noted be affected by history of facial swelling, history of acute pulpitis, chronic apical periodontitis, multiple retained root segments, rampant dental caries, chronic periodontitis, and bilateral mandibular lingual tori.    DESCRIPTION OF PROCEDURE: Patient was brought to the main operating room number 9. Patient was then placed in the supine position on the operating table. General anesthesia was then induced per the anesthesia team. The patient was then prepped and draped in the usual manner for dental medicine procedure. A timeout was performed. The patient was identified and procedures were verified. A throat pack was placed at this time. The oral cavity was then thoroughly examined with the findings noted above. The patient was then ready for dental medicine procedure as follows:  Local anesthesia was then administered sequentially with a total utilization of 6 carpules each containing 34 mg of lidocaine with 0.017 mg of epinephrine as well as 2 carpules  each containing 9 mg bupivacaine with 0.009 mg of epinephrine.  The Maxillary left and right quadrants first approached. Anesthesia was then delivered utilizing infiltration with lidocaine with epinephrine. A #15 blade incision was then made from the maxillary right tuberosity and extended to the maxillary left tuberosity.  A  surgical flap was then carefully reflected. Appropriate amounts of buccal and interseptal bone were then removed utilizing a surgical handpiece and bur and copious amounts of sterile water.  The teeth were  then subluxated with a series of straight elevators. Tooth numbers 2, 4, 5, 6, 7, 8, 9, 10, 11, 12, 13, 14, 15 were then removed with a 150 forceps without complications. Alveoloplasty was  then performed utilizing a ronguers and bone file. The surgical site was then irrigated with copious amounts of sterile saline. The tissues were approximated and trimmed appropriately. The surgical site was then closed from the maxillary right tuberosity and extended to the mesial #8 utilizing 3-0 chromic gut suture in a continuous interrupted suture technique 1. The maxillary left surgical site was then closed from the maxillary left tuberosity and extended the mesial #9 utilizing 3-0 chromic gut suture in a continuous interrupted suture technique 1.  At this point time, the mandibular quadrants were approached. The patient was given bilateral inferior alveolar nerve blocks and long buccal nerve blocks utilizing the bupivacaine with epinephrine. Further infiltration was then achieved utilizing the lidocaine with epinephrine. A 15 blade incision was then made from the distal of number 18 and extended to the distal of #30 .  A surgical flap was then carefully reflected. Appropriate amounts of buccal and interseptal bone were then removed utilizing a surgical handpiece and copious amount of sterile water. The lower teeth were then subluxated with a series straight elevators. Tooth #19 was then removed with a 23 forceps without complications. Tooth numbers 20, 21, 22, 23, 24, 25, 26, 27, 28 were then removed with a 151 forceps without complications. Alveoloplasty was then performed utilizing a rongeurs and bone file. At this point time the lingual flaps were further reflected to expose the bilateral mandibular lingual tori. These were then reduced utilizing a surgical handpiece and bur and copious amounts sterile water. Further alveoloplasty was then performed utilizing a rongeur and bone file as needed. The tissues were approximated and trimmed appropriately. The surgical sites were then irrigated with copious amounts of sterile saline 4. The mandibular left surgical site was then closed from the distal of  18  and extended the mesial #24 utilizing 3-0 chromic gut suture in a continuous interrupted suture technique 1. The mandibular right surgical site was then closed from the distal of #30 and extended to the mesial of #25 utilizing 3-0 chromic gut suture in a continuous interrupted suture technique 1. 3 individual interrupted sutures are then placed to further close the surgical site as needed.  At this point time, the entire mouth was irrigated with copious amounts of sterile saline. The patient was examined for complications, seeing none, the dental medicine procedure was deemed to be complete. The throat pack was removed at this time. An oral airway was then placed at the request of the anesthesia team. A series of 4 x 4 gauze were placed in the mouth to aid hemostasis. The patient was then handed over to the anesthesia team for final disposition. After an appropriate amount of time, the patient was extubated and taken to the postanesthsia care unit in good condition. All counts were correct for the dental medicine procedure.   Charlynne Pander, DDS.

## 2014-10-13 NOTE — Anesthesia Preprocedure Evaluation (Addendum)
Anesthesia Evaluation  Patient identified by MRN, date of birth, ID band Patient awake    Reviewed: Allergy & Precautions, H&P , NPO status , Patient's Chart, lab work & pertinent test results  History of Anesthesia Complications Negative for: history of anesthetic complications  Airway Mallampati: II  TM Distance: >3 FB Neck ROM: full    Dental no notable dental hx. 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  Full Beard.  Reproductive/Obstetrics negative OB ROS                          Anesthesia Physical Anesthesia Plan  ASA: II  Anesthesia Plan: General   Post-op Pain Management:    Induction: Intravenous  Airway Management Planned: Nasal ETT  Additional Equipment:   Intra-op Plan:   Post-operative Plan: Extubation in OR  Informed Consent: I have reviewed the patients History and Physical, chart, labs and discussed the procedure including the risks, benefits and alternatives for the proposed anesthesia with the patient or authorized representative who has indicated his/her understanding and acceptance.     Plan Discussed with: Anesthesiologist, CRNA and  Surgeon  Anesthesia Plan Comments: (LTA given smoking history Midazolam premed given benzo history use )        Anesthesia Quick Evaluation

## 2014-10-14 ENCOUNTER — Encounter (HOSPITAL_COMMUNITY): Payer: Self-pay | Admitting: Dentistry

## 2014-10-14 ENCOUNTER — Telehealth (HOSPITAL_COMMUNITY): Payer: Self-pay

## 2014-10-14 NOTE — Telephone Encounter (Signed)
10/14/14     Called to follow up w/patient after surgical extractions. Spoke w/patient's father Lenoir Morgan) and patient is doing well.  S/R appt. scheduled for 10/24/14 10:00.  LRI

## 2014-10-19 ENCOUNTER — Other Ambulatory Visit (HOSPITAL_COMMUNITY): Payer: Self-pay | Admitting: Dentistry

## 2014-10-19 MED ORDER — ACETAMINOPHEN-CODEINE #3 300-30 MG PO TABS: 1.0000 | ORAL_TABLET | Freq: Four times a day (QID) | ORAL | Status: DC | PRN

## 2014-10-24 ENCOUNTER — Encounter (HOSPITAL_COMMUNITY): Payer: Self-pay | Admitting: Dentistry

## 2014-10-24 ENCOUNTER — Ambulatory Visit (HOSPITAL_COMMUNITY): Payer: Self-pay | Admitting: Dentistry

## 2014-10-24 ENCOUNTER — Encounter (INDEPENDENT_AMBULATORY_CARE_PROVIDER_SITE_OTHER): Payer: Self-pay

## 2014-10-24 VITALS — BP 136/88 | HR 70 | Temp 98.6°F

## 2014-10-24 DIAGNOSIS — K08109 Complete loss of teeth, unspecified cause, unspecified class: Secondary | ICD-10-CM

## 2014-10-24 DIAGNOSIS — K08409 Partial loss of teeth, unspecified cause, unspecified class: Secondary | ICD-10-CM

## 2014-10-24 DIAGNOSIS — R220 Localized swelling, mass and lump, head: Secondary | ICD-10-CM

## 2014-10-24 DIAGNOSIS — K0820 Unspecified atrophy of edentulous alveolar ridge: Secondary | ICD-10-CM

## 2014-10-24 NOTE — Patient Instructions (Addendum)
PLAN: 1. Continue salt water rinses every 2 hours while awake to aid healing. 2. Advance diet as tolerated. 3. Follow-up with the general dentist of his choice for fabrication of upper and lower complete dentures. Suggest an additional 6-8 weeks of healing before start of denture fabrication. 4. Use ibuprofen as needed for pain relief. Patient cautioned about excessive use of ibuprofen and the effect on the kidneys. 5. No additional narcotic pain medication will be provided at this time.   Charlynne Pander, DDS   MOUTH CARE AFTER SURGERY  FACTS:  Ice used in ice bag helps keep the swelling down, and can help lessen the pain.  It is easier to treat pain BEFORE it happens.  Spitting disturbs the clot and may cause bleeding to start again, or to get worse.  Smoking delays healing and can cause complications.  Sharing prescriptions can be dangerous.  Do not take medications not recently prescribed for you.  Antibiotics may stop birth control pills from working.  Use other means of birth control while on antibiotics.  Warm salt water rinses after the first 24 hours will help lessen the swelling:  Use 1/2 teaspoonful of table salt per oz.of water.  DO NOT:  Do not spit.  Do not drink through a straw.  Strongly advised not to smoke, dip snuff or chew tobacco at least for 3 days.  Do not eat sharp or crunchy foods.  Avoid the area of surgery when chewing.  Do not stop your antibiotics before your instructions say to do so.  Do not eat hot foods until bleeding has stopped.  If you need to, let your food cool down to room temperature.  EXPECT:  Some swelling, especially first 2-3 days.  Soreness or discomfort in varying degrees.  Follow your dentist's instructions about how to handle pain before it starts.  Pinkish saliva or light blood in saliva, or on your pillow in the morning.  This can last around 24 hours.  Bruising inside or outside the mouth.  This may not show up  until 2-3 days after surgery.  Don't worry, it will go away in time.  Pieces of "bone" may work themselves loose.  It's OK.  If they bother you, let us know.  WHAT TO DO IMMEDIATELY AFTER SURGERY:  Bite on the gauze with steady pressure for 1-2 hours.  Don't chew on the gauze.  Do not lie down flat.  Raise your head support especially for the first 24 hours.  Apply ice to your face on the side of the surgery.  You may apply it 20 minutes on and a few minutes off.  Ice for 8-12 hours.  You may use ice up to 24 hours.  Before the numbness wears off, take a pain pill as instructed.  Prescription pain medication is not always required.  SWELLING:  Expect swelling for the first couple of days.  It should get better after that.  If swelling increases 3 days or so after surgery; let us know as soon as possible.  FEVER:  Take Tylenol every 4 hours if needed to lower your temperature, especially if it is at 100F or higher.  Drink lots of fluids.  If the fever does not go away, let us know.  BREATHING TROUBLE:  Any unusual difficulty breathing means you have to have someone bring you to the emergency room ASAP  BLEEDING:  Light oozing is expected for 24 hours or so.  Prop head up with pillows  Avoid  spitting  Do not confuse bright red fresh flowing blood with lots of saliva colored with a little bit of blood.  If you notice some bleeding, place gauze or a tea bag where it is bleeding and apply CONSTANT pressure by biting down for 1 hour.  Avoid talking during this time.  Do not remove the gauze or tea bag during this hour to "check" the bleeding.  If you notice bright RED bleeding FLOWING out of particular area, and filling the floor of your mouth, put a wad of gauze on that area, bite down firmly and constantly.  Call us immediately.  If we're closed, have someone bring you to the emergency room.  ORAL HYGIENE:  Brush your teeth as usual after meals and before bedtime.  Use  a soft toothbrush around the area of surgery.  DO NOT AVOID BRUSHING.  Otherwise bacteria(germs) will grow and may delay healing or encourage infection.  Since you cannot spit, just gently rinse and let the water flow out of your mouth.  DO NOT SWISH HARD.  EATING:  Cool liquids are a good point to start.  Increase to soft foods as tolerated.  PRESCRIPTIONS:  Follow the directions for your prescriptions exactly as written.  If Dr. Kristin Bruins gave you a narcotic pain medication, do not drive, operate machinery or drink alcohol when on that medication.  QUESTIONS:  Call our office during office hours [phone] or call the Emergency Room at [phone].

## 2014-10-24 NOTE — Progress Notes (Signed)
POST OPERATIVE NOTE:  10/24/2014   Eleftherios Dudenhoeffer Borntreger Jr. [id number]  VITALS: BP 136/88 mmHg  Pulse 70  Temp(Src) 98.6 F (37 C) (Oral)  LABS:  Lab Results  Component Value Date   WBC 7.9 10/02/2014   HGB 14.0 10/02/2014   HCT 41.7 10/02/2014   MCV 91.4 10/02/2014   PLT 177 10/02/2014   BMET    Component Value Date/Time   NA 141 10/02/2014 0810   NA 141 07/20/2013 1356   K 4.3 10/02/2014 0810   K 3.8 07/20/2013 1356   CL 109 10/02/2014 0810   CL 107 07/20/2013 1356   CO2 25 10/02/2014 0810   CO2 26 07/20/2013 1356   GLUCOSE 117* 10/02/2014 0810   GLUCOSE 93 07/20/2013 1356   BUN <5* 10/02/2014 0810   BUN 4* 07/20/2013 1356   CREATININE 0.65 10/02/2014 0810   CREATININE 0.81 07/20/2013 1356   CALCIUM 9.2 10/02/2014 0810   CALCIUM 8.7 07/20/2013 1356   GFRNONAA >60 10/02/2014 0810   GFRNONAA >60 07/20/2013 1356   GFRAA >60 10/02/2014 0810   GFRAA >60 07/20/2013 1356    No results found for: INR, PROTIME No results found for: PTT   [name] Sesler Jr. is status post extraction remaining teeth with alveoloplasty and pre-prosthetic surgery as indicated in the operating room with general anesthesia on 10/13/2014.  The patient now presents for evaluation of healing and suture removal.  SUBJECTIVE: Patient with some persistent discomfort involving the maxillary anterior area. Patient indicates that the Tylenol No. 3 pain medication offers him minimal relief of pain. Patient is also using ibuprofen 800 mg as needed for pain.  EXAM: There is no sign of infection, heme, or ooze. Sutures are loosely intact. Patient is healing in by generalized primary closure. There is atrophy of the edentulous alveolar ridge. Patient is now totally edentulous.  PROCEDURE: The patient was given a chlorhexidine gluconate rinse for 30 seconds. Sutures were then removed without complication. Patient tolerated the procedure well.  ASSESSMENT: Post operative course is consistent with dental  procedures performed in the operating room with general anesthesia. The patient is edentulous. There is atrophy of the edentulous alveolar ridges. Gaurded prognosis for successful upper and lower complete denture fabrication was discussed.  PLAN: 1. Continue salt water rinses every 2 hours while awake to aid healing. 2. Advance diet as tolerated. 3. Follow-up with the general dentist of his choice for fabrication of upper and lower complete dentures. Suggest an additional 6-8 weeks of healing before start of denture fabrication. 4. Use ibuprofen as needed for pain relief. Patient cautioned about excessive use of ibuprofen and the effect on the kidneys. 5. No additional narcotic pain medication will be provided at this time.   Charlynne Pander, DDS

## 2014-11-15 IMAGING — CT CT ABD-PELV W/O CM
2 of 4 series · 17 of 46 positions shown, 19 images · non-contrast
Comparison: 03/01/2013 CT and 03/02/2013 MR.

CLINICAL DATA: Lower abdominal pain extending to testicular region.
Nausea. Diarrhea. Difficulty urinating for the past week.

EXAM:
CT ABDOMEN AND PELVIS WITHOUT CONTRAST
TECHNIQUE: Multidetector CT imaging of the abdomen and pelvis was performed
following the standard protocol without intravenous contrast.

[Series 2: rtn a/p w/o · axial · non-contrast · 0.74mm/px · z∈[-499,-94]mm · 14 of 89 slices shown, 16 images]
[im 4/89  soft-tissue]
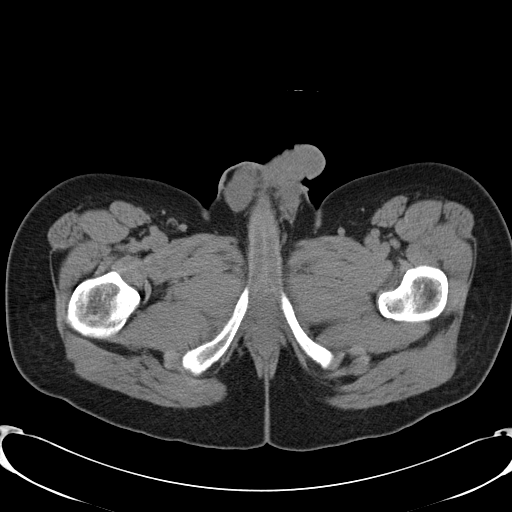
[im 4/89  bone]
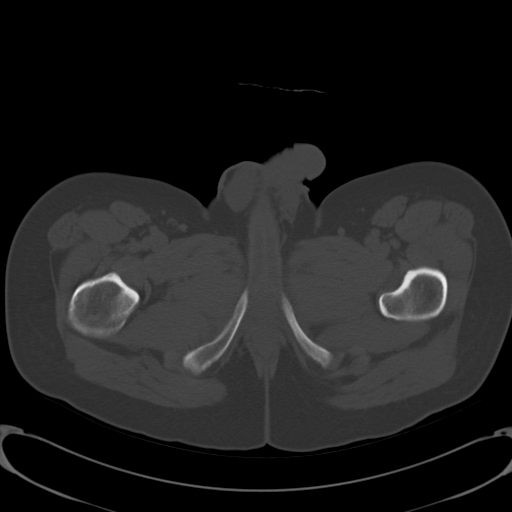
[im 11/89  soft-tissue]
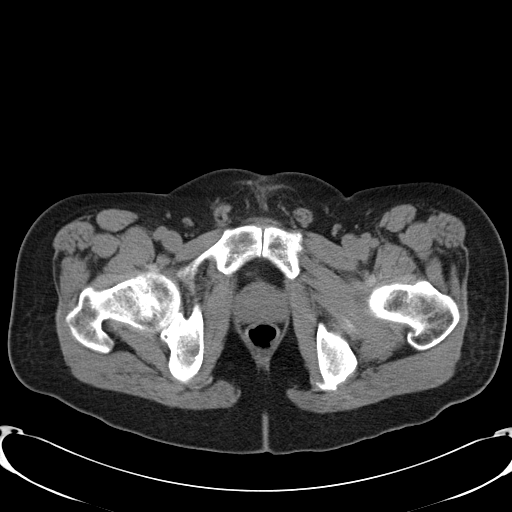
[im 18/89  soft-tissue]
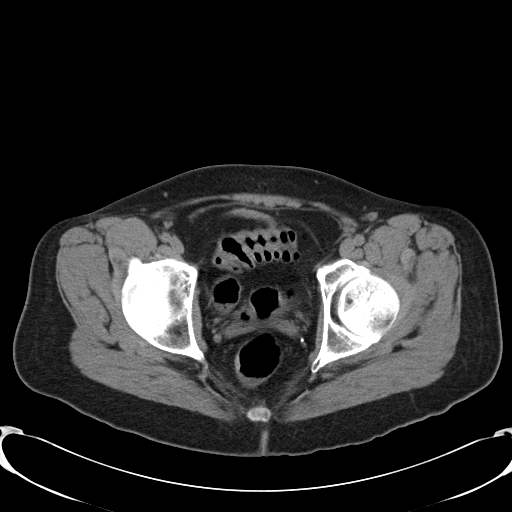
[im 25/89  soft-tissue]
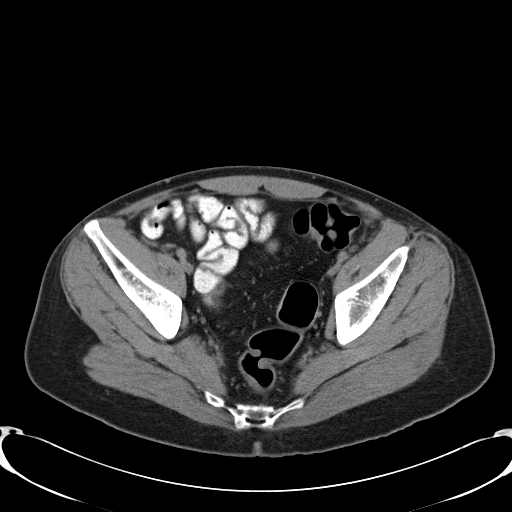
[im 29/89  soft-tissue]
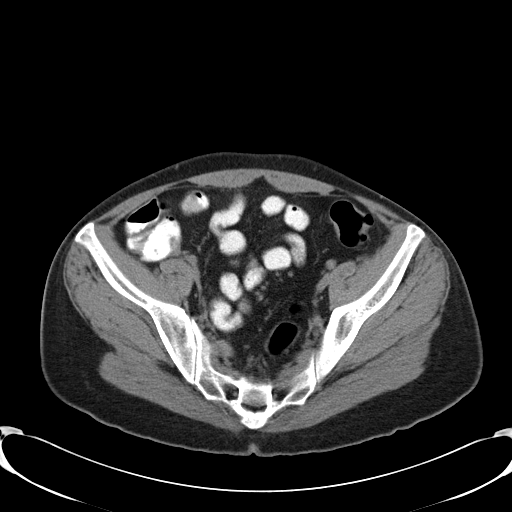
[im 36/89  soft-tissue]
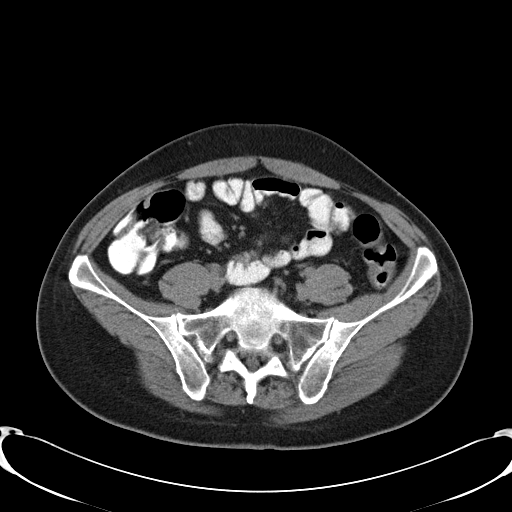
[im 43/89  soft-tissue]
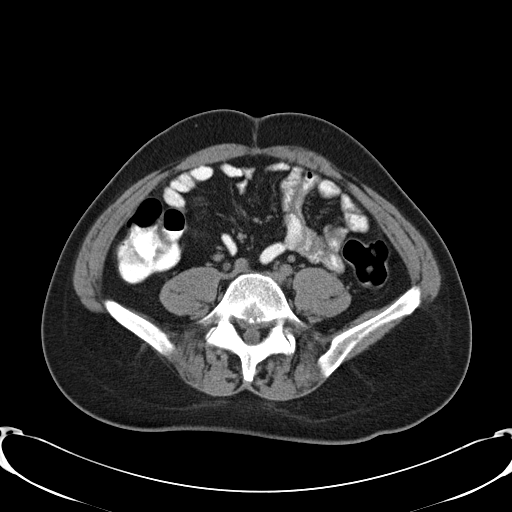
[im 46/89  soft-tissue]
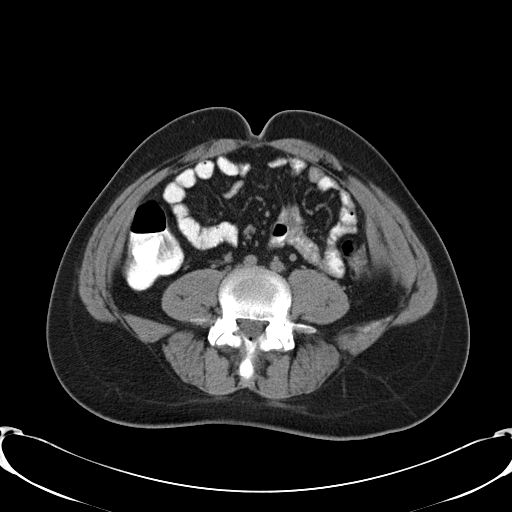
[im 53/89  soft-tissue]
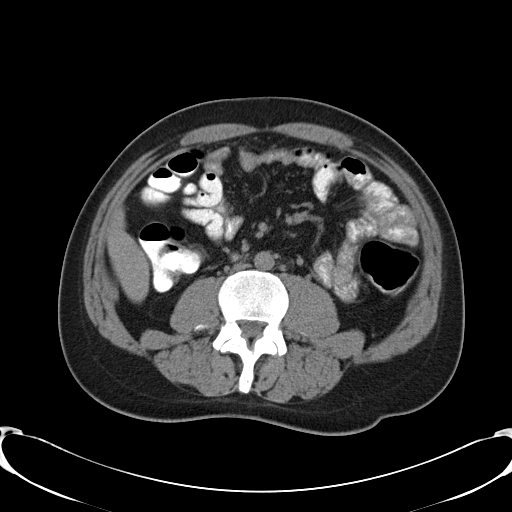
[im 53/89  bone]
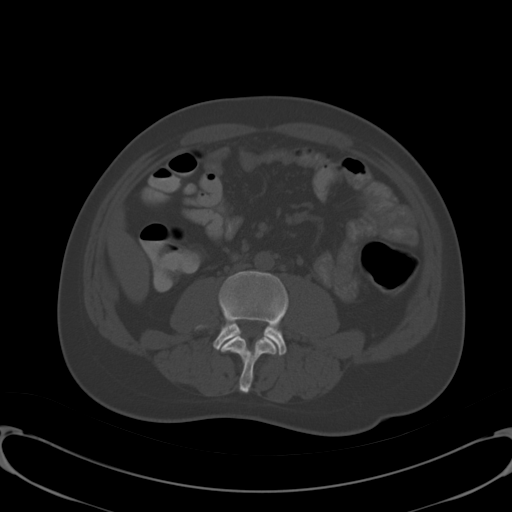
[im 60/89  soft-tissue]
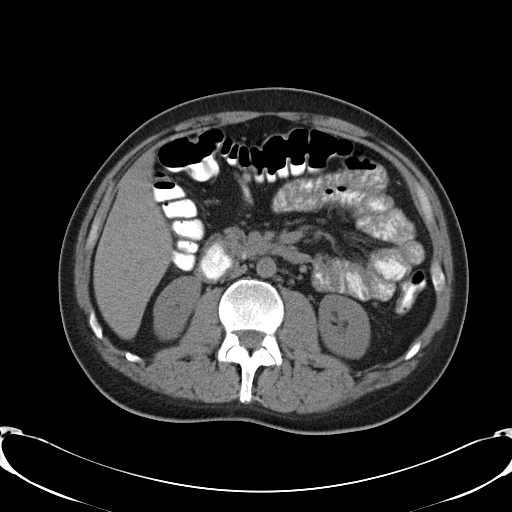
[im 67/89  soft-tissue]
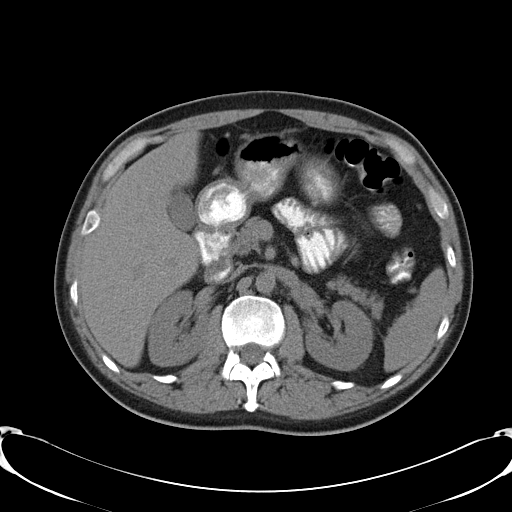
[im 71/89  soft-tissue]
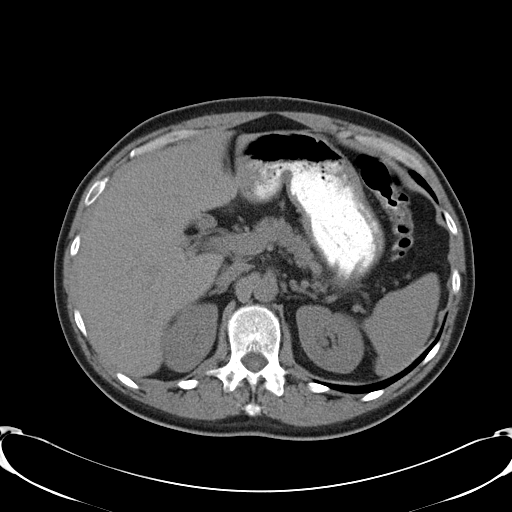
[im 78/89  soft-tissue]
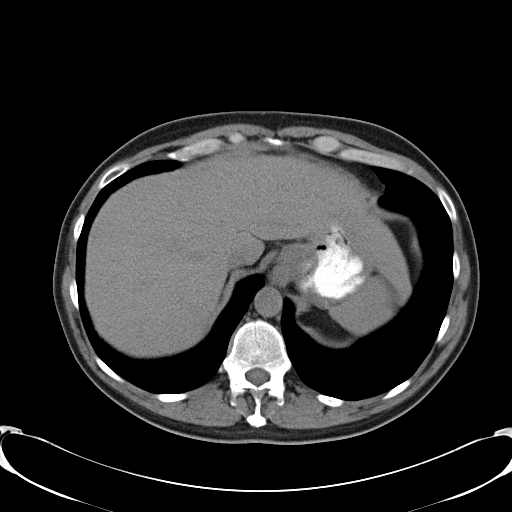
[im 85/89  soft-tissue]
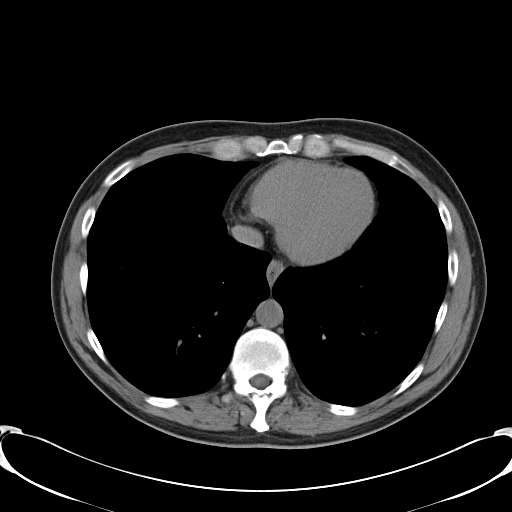

[Series 602: <mpr thick range> · coronal · 0.87mm/px · 3 of 87 slices shown]
[im 29/87  soft-tissue]
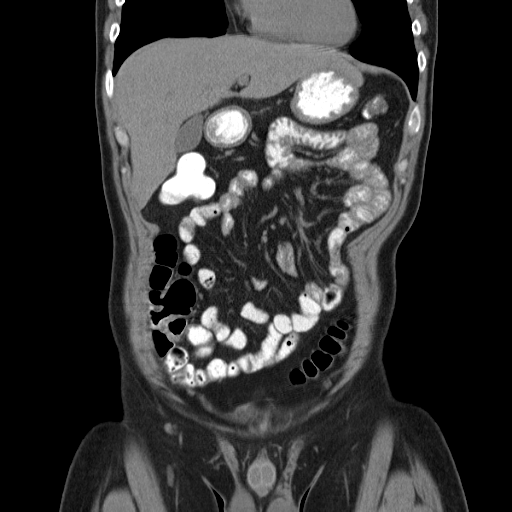
[im 39/87  soft-tissue]
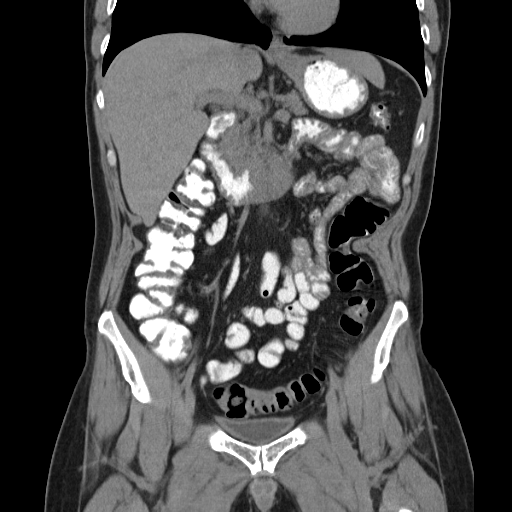
[im 48/87  soft-tissue]
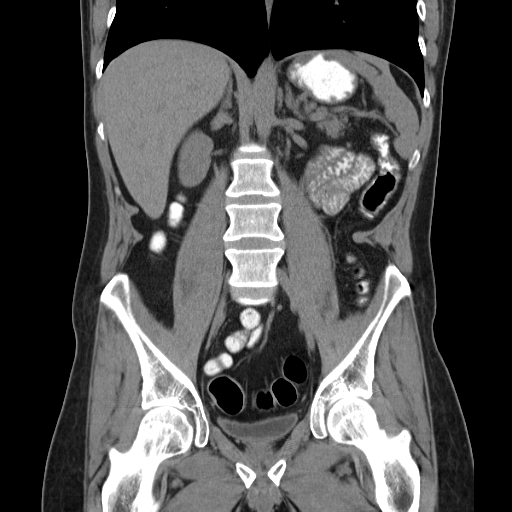

[17 of 46 positions shown; findings below may reference images not displayed]

FINDINGS: No renal or ureteral calculi or findings to suggest renal collecting
system obstruction.

No extra luminal bowel inflammatory process, free fluid or free air.

Within the limitation of non contrast imaging, no worrisome hepatic,
splenic, pancreatic, renal or adrenal lesion. No calcified
gallstones.

Tiny calcification right common iliac artery. No abdominal aortic
aneurysm. No adenopathy.

No bony destructive lesion.

Partially decompressed urinary bladder without gross abnormality.

No bowel containing hernia.

No worrisome lung base abnormality.
IMPRESSION: No cause of patient's discomfort identified as detailed above.

Previously noted pancreatic abnormality not appreciated on present
noncontrast examination.

## 2015-04-03 ENCOUNTER — Ambulatory Visit (INDEPENDENT_AMBULATORY_CARE_PROVIDER_SITE_OTHER): Payer: Medicare Other | Admitting: Family Medicine

## 2015-04-03 ENCOUNTER — Encounter: Payer: Self-pay | Admitting: Family Medicine

## 2015-04-03 ENCOUNTER — Ambulatory Visit (HOSPITAL_BASED_OUTPATIENT_CLINIC_OR_DEPARTMENT_OTHER)
Admission: RE | Admit: 2015-04-03 | Discharge: 2015-04-03 | Disposition: A | Discharge status: Home / Self Care | Payer: Medicare Other | Source: Ambulatory Visit | Attending: Family Medicine | Admitting: Family Medicine

## 2015-04-03 VITALS — BP 106/70 | HR 85 | Temp 98.6°F | Resp 16 | Ht 71.0 in | Wt 155.0 lb

## 2015-04-03 DIAGNOSIS — N50819 Testicular pain, unspecified: Secondary | ICD-10-CM | POA: Diagnosis not present

## 2015-04-03 DIAGNOSIS — R1084 Generalized abdominal pain: Secondary | ICD-10-CM | POA: Diagnosis not present

## 2015-04-03 DIAGNOSIS — R112 Nausea with vomiting, unspecified: Secondary | ICD-10-CM | POA: Insufficient documentation

## 2015-04-03 DIAGNOSIS — N429 Disorder of prostate, unspecified: Secondary | ICD-10-CM | POA: Diagnosis not present

## 2015-04-03 DIAGNOSIS — Z113 Encounter for screening for infections with a predominantly sexual mode of transmission: Secondary | ICD-10-CM | POA: Diagnosis not present

## 2015-04-03 DIAGNOSIS — R1030 Lower abdominal pain, unspecified: Secondary | ICD-10-CM | POA: Insufficient documentation

## 2015-04-03 DIAGNOSIS — R1110 Vomiting, unspecified: Secondary | ICD-10-CM | POA: Diagnosis not present

## 2015-04-03 LAB — POCT CBC
Granulocyte percent: 63.5 %G (ref 37–80)
HCT, POC: 45.1 % (ref 43.5–53.7)
Hemoglobin: 15.6 g/dL (ref 14.1–18.1)
Lymph, poc: 2.5 (ref 0.6–3.4)
MCH, POC: 30.8 pg (ref 27–31.2)
MCHC: 34.6 g/dL (ref 31.8–35.4)
MCV: 89.2 fL (ref 80–97)
MID (CBC): 0.3 (ref 0–0.9)
MPV: 7.2 fL (ref 0–99.8)
PLATELET COUNT, POC: 228 10*3/uL (ref 142–424)
POC Granulocyte: 4.8 (ref 2–6.9)
POC LYMPH PERCENT: 33.1 %L (ref 10–50)
POC MID %: 3.4 %M (ref 0–12)
RBC: 5.05 M/uL (ref 4.69–6.13)
RDW, POC: 13.8 %
WBC: 7.6 10*3/uL (ref 4.6–10.2)

## 2015-04-03 LAB — HEMOCCULT GUIAC POC 1CARD (OFFICE): Fecal Occult Blood, POC: NEGATIVE

## 2015-04-03 LAB — POCT URINALYSIS DIP (MANUAL ENTRY)
Bilirubin, UA: NEGATIVE
Blood, UA: NEGATIVE
Glucose, UA: NEGATIVE
Ketones, POC UA: NEGATIVE
Leukocytes, UA: NEGATIVE
Nitrite, UA: NEGATIVE
PROTEIN UA: NEGATIVE
Spec Grav, UA: <=1.005
UROBILINOGEN UA: 0.2
pH, UA: 5.5

## 2015-04-03 LAB — POC MICROSCOPIC URINALYSIS (UMFC): Mucus: ABSENT

## 2015-04-03 LAB — POCT SEDIMENTATION RATE: POCT SED RATE: 6 mm/hr (ref 0–22)

## 2015-04-03 NOTE — Progress Notes (Signed)
Subjective:    Patient ID: Brent Morales., male    DOB: Sep 14, 1975, 40 y.o.   MRN: [id number] By signing my name below, I, Javier Docker, attest that this documentation has been prepared under the direction and in the presence of Norberto Sorenson, MD. Electronically Signed: Javier Docker, ER Scribe. 04/03/2015. 4:53 PM.  Chief Complaint  Patient presents with  . Abdominal Pain    LLQ  . Testicle Pain    pt feels like testicle is going inside the cavity   HPI HPI Comments: Brent Nin. is a 40 y.o. male with a past hx of bilateral hernia surgery and multiple events of pancreatitis who presents to [facility] complaining of constant dull LLQ abdominal pain that radiates to his left lower back for the last 4-6 weeks. He states his left testicle has been traveling up into his abdomen and he has had to push it back down into his scrotal sack. When this occurs he has significant pain with associated nausea and diaphoresis. He has also had intermittent difficulty getting his stream started for the last six weeks. He is not having any internal organs traveling down into his scrotum. His BM have been normal. He has no hx of bowel problems. He denies penile discharge. He has been eating normally.  He has been noted to have low grade chronic pain in his groin for which he was followed by a pain clinic and administered narcotics. He has had chronic pain was started since his bilateral hernia repair with mesh. He was noted to have pain along the spermatic cords bilaterally. He has had multiple imaging of his scrotum, most recently with a CT in July, and an ultra sound less than two years ago.     Past Medical History  Diagnosis Date  . Hernia     Bilateral Inguinal  . Cervical stenosis of spinal canal   . Pancreatitis   . Syncope     April 2016  . Depression   . Anxiety   . Headache   . Arthritis   . Fibromyalgia    Allergies  Allergen Reactions  . Iohexol Other (See Comments)     Desc: PER  MD/PER PATIENT NOT ALLERGIC 06/11/05 RM/PER RADIOLOGIST GIVE PREMEDICATION 06/12/05    Current Outpatient Prescriptions on File Prior to Visit  Medication Sig Dispense Refill  . ALPRAZolam (XANAX) 1 MG tablet Take 1 mg by mouth 4 (four) times daily.     Marland Kitchen ibuprofen (ADVIL,MOTRIN) 200 MG tablet Take 400-800 mg by mouth 2 (two) times daily as needed (pain).    . nicotine (NICODERM CQ - DOSED IN MG/24 HOURS) 21 mg/24hr patch Place 1 patch (21 mg total) onto the skin daily. (Patient not taking: Reported on 10/24/2014) 28 patch 0   No current facility-administered medications on file prior to visit.    Review of Systems  Constitutional: Positive for diaphoresis. Negative for fever and chills.  Gastrointestinal: Positive for nausea and abdominal pain. Negative for diarrhea, constipation, blood in stool and anal bleeding.  Genitourinary: Positive for flank pain, difficulty urinating and testicular pain. Negative for dysuria, frequency, hematuria and discharge.      Objective:  BP 106/70 mmHg  Pulse 85  Temp(Src) 98.6 F (37 C)  Resp 16  Ht  (1.803 m)  Wt 155 lb (70.308 kg)  BMI 21.63 kg/m2  SpO2 98%  Physical Exam  Constitutional: He is oriented to person, place, and time. He appears well-developed and well-nourished.  No distress.  HENT:  Head: Normocephalic and atraumatic.  Eyes: Pupils are equal, round, and reactive to light.  Neck: Neck supple.  Normal thyroid.   Cardiovascular: Normal rate, regular rhythm and normal heart sounds.   No murmur heard. Pulmonary/Chest: Effort normal. No respiratory distress.  Lungs clear.  Abdominal:  Mild CVA tenderness. Hyperactive bowel sounds. Severe generalized diffuse tenderness over epigastrium, left greater than right. No referred pain no guarding.   Genitourinary:  No nodules or masses. Pain along left spermatic cord. Pain at the left inguinal opening immediately above left testes. Prostate enlarged and boggy.   Musculoskeletal: Normal  range of motion.  Neurological: He is alert and oriented to person, place, and time. Coordination normal.  Skin: Skin is warm and dry. He is not diaphoretic.  Psychiatric: He has a normal mood and affect. His behavior is normal.  Nursing note and vitals reviewed.     Assessment & Plan:   1. Testicular pain   2. Generalized abdominal pain   3. Routine screening for STI (sexually transmitted infection)   4. Prostate irregularity   Send for stat ct now due to severity of pain. However, pt does have a noted h/o chronic pelvic pain and an opiate addiction so do not treat symptomatically.  ADDENDUM: CT and labs neg, pt's pain is continuing to refer back to urology for further eval.  Orders Placed This Encounter  Procedures  . Trichomonas vaginalis, RNA  . GC/Chlamydia Probe Amp  . Urine culture  . CT Abdomen Pelvis Wo Contrast    Pt can leave after CT; call report to Dr. Clelia Croft [phone] or cell phone [phone].    Standing Status: Future     Number of Occurrences: 1     Standing Expiration Date: 07/01/2016    Order Specific Question:  Reason for Exam (SYMPTOM  OR DIAGNOSIS REQUIRED)    Answer:  diffuse abd pain worst in left lower quandrant - r/o infection or defect of prior mesh hernia repair, concern for diverticulitis or intra-abd abscess    Order Specific Question:  Preferred imaging location?    Answer:  [facility]  . US Scrotum    Standing Status: Future     Number of Occurrences:      Standing Expiration Date: 06/01/2016    Order Specific Question:  Reason for Exam (SYMPTOM  OR DIAGNOSIS REQUIRED)    Answer:  left testicular pain worsening over 2 weeks, h/o chronic groin pain since hernia mesh repair    Order Specific Question:  Preferred imaging location?    Answer:  External  . PSA  . Lipase  . Comprehensive metabolic panel  . Ambulatory referral to Urology    Referral Priority:  Routine    Referral Type:  Consultation    Referral Reason:  Specialty  Services Required    Requested Specialty:  Urology    Number of Visits Requested:  1  . POCT CBC  . POCT SEDIMENTATION RATE  . POCT urinalysis dipstick  . POCT Microscopic Urinalysis ([facility])  . POCT occult blood stool    I personally performed the services described in this documentation, which was scribed in my presence. The recorded information has been reviewed and considered, and addended by me as needed.  Norberto Sorenson, MD MPH  Results for orders placed or performed in visit on 04/03/15  Trichomonas vaginalis, RNA  Result Value Ref Range   T vaginalis RNA Negative   GC/Chlamydia Probe Amp  Result Value Ref Range  CT Probe RNA NOT DETECTED    GC Probe RNA NOT DETECTED   Urine culture  Result Value Ref Range   Colony Count NO GROWTH    Organism ID, Bacteria NO GROWTH   PSA  Result Value Ref Range   PSA 0.78 <=4.00 ng/mL  Lipase  Result Value Ref Range   Lipase 6 (L) 7 - 60 U/L  Comprehensive metabolic panel  Result Value Ref Range   Sodium 137 135 - 146 mmol/L   Potassium 4.5 3.5 - 5.3 mmol/L   Chloride 102 98 - 110 mmol/L   CO2 26 20 - 31 mmol/L   Glucose, Bld 75 65 - 99 mg/dL   BUN 9 7 - 25 mg/dL   Creat 1.61 0.96 - 0.45 mg/dL   Total Bilirubin 0.4 0.2 - 1.2 mg/dL   Alkaline Phosphatase 92 40 - 115 U/L   AST 21 10 - 40 U/L   ALT 18 9 - 46 U/L   Total Protein 7.2 6.1 - 8.1 g/dL   Albumin 4.7 3.6 - 5.1 g/dL   Calcium 9.8 8.6 - 40.9 mg/dL  POCT CBC  Result Value Ref Range   WBC 7.6 4.6 - 10.2 K/uL   Lymph, poc 2.5 0.6 - 3.4   POC LYMPH PERCENT 33.1 10 - 50 %L   MID (cbc) 0.3 0 - 0.9   POC MID % 3.4 0 - 12 %M   POC Granulocyte 4.8 2 - 6.9   Granulocyte percent 63.5 37 - 80 %G   RBC 5.05 4.69 - 6.13 M/uL   Hemoglobin 15.6 14.1 - 18.1 g/dL   HCT, POC 81.1 91.4 - 53.7 %   MCV 89.2 80 - 97 fL   MCH, POC 30.8 27 - 31.2 pg   MCHC 34.6 31.8 - 35.4 g/dL   RDW, POC 78.2 %   Platelet Count, POC 228 142 - 424 K/uL   MPV 7.2 0 - 99.8 fL  POCT SEDIMENTATION RATE    Result Value Ref Range   POCT SED RATE 6 0 - 22 mm/hr  POCT urinalysis dipstick  Result Value Ref Range   Color, UA yellow yellow   Clarity, UA clear clear   Glucose, UA negative negative   Bilirubin, UA negative negative   Ketones, POC UA negative negative   Spec Grav, UA <=1.005    Blood, UA negative negative   pH, UA 5.5    Protein Ur, POC negative negative   Urobilinogen, UA 0.2    Nitrite, UA Negative Negative   Leukocytes, UA Negative Negative  POCT Microscopic Urinalysis ([facility])  Result Value Ref Range   WBC,UR,HPF,POC None None WBC/hpf   RBC,UR,HPF,POC None None RBC/hpf   Bacteria None None, Too numerous to count   Mucus Absent Absent   Epithelial Cells, UR Per Microscopy None None, Too numerous to count cells/hpf  POCT occult blood stool  Result Value Ref Range   Fecal Occult Blood, POC Negative Negative   Card #1 Date 04/03/2015    Card #2 Fecal Occult Blod, POC     Card #2 Date     Card #3 Fecal Occult Blood, POC     Card #3 Date     Ct Abdomen Pelvis Wo Contrast  04/03/2015  CLINICAL DATA:  Chronic lower abdominal pain, nausea and vomiting. Initial encounter. EXAM: CT ABDOMEN AND PELVIS WITHOUT CONTRAST TECHNIQUE: Multidetector CT imaging of the abdomen and pelvis was performed following the standard protocol without IV contrast. COMPARISON:  CT of the  abdomen and pelvis performed 10/01/2014, and right upper quadrant abdominal ultrasound performed 10/02/2014 FINDINGS: The visualized lung bases are clear. The liver and spleen are unremarkable in appearance. The gallbladder is within normal limits. The pancreas and adrenal glands are unremarkable. The kidneys are unremarkable in appearance. There is no evidence of hydronephrosis. No renal or ureteral stones are seen. No perinephric stranding is appreciated. No free fluid is identified. The small bowel is unremarkable in appearance. The stomach is within normal limits. No acute vascular abnormalities are seen. The appendix  is not well seen, though suggested on coronal images, without evidence of appendicitis. Contrast progresses to the level of the ascending colon. The colon is partially filled with stool and is grossly unremarkable in appearance. The bladder is moderately distended and grossly unremarkable. The prostate remains normal in size. No inguinal lymphadenopathy is seen. No acute osseous abnormalities are identified. IMPRESSION: Unremarkable noncontrast CT of the abdomen and pelvis. Electronically Signed   By: Roanna Raider M.D.   On: 04/03/2015 23:23

## 2015-04-03 NOTE — Patient Instructions (Signed)
Check in Emergency Room. Let check in know that you are scheduled for a CT as an outpatient. Oral contrast will be given to you at the Emergency Room.

## 2015-04-04 ENCOUNTER — Telehealth: Payer: Self-pay

## 2015-04-04 LAB — COMPREHENSIVE METABOLIC PANEL
ALT: 18 U/L (ref 9–46)
AST: 21 U/L (ref 10–40)
Albumin: 4.7 g/dL (ref 3.6–5.1)
Alkaline Phosphatase: 92 U/L (ref 40–115)
BUN: 9 mg/dL (ref 7–25)
CHLORIDE: 102 mmol/L (ref 98–110)
CO2: 26 mmol/L (ref 20–31)
CREATININE: 0.84 mg/dL (ref 0.60–1.35)
Calcium: 9.8 mg/dL (ref 8.6–10.3)
GLUCOSE: 75 mg/dL (ref 65–99)
Potassium: 4.5 mmol/L (ref 3.5–5.3)
SODIUM: 137 mmol/L (ref 135–146)
Total Bilirubin: 0.4 mg/dL (ref 0.2–1.2)
Total Protein: 7.2 g/dL (ref 6.1–8.1)

## 2015-04-04 LAB — LIPASE: LIPASE: 6 U/L — AB (ref 7–60)

## 2015-04-04 LAB — TRICHOMONAS VAGINALIS, PROBE AMP: TRICHOMONAS VAGINALIS PROBE APTIMA: NEGATIVE

## 2015-04-04 NOTE — Telephone Encounter (Signed)
Pt states he had xrays done and was told we would give him a call and also would like to speak with the Dr regarding his visit. Please call [phone]

## 2015-04-04 NOTE — Telephone Encounter (Signed)
[phone]  Extreme pain Please have a provider call ASAP.

## 2015-04-04 NOTE — Telephone Encounter (Signed)
Left detailed vm with CT result and recommendations as per release of information.

## 2015-04-05 ENCOUNTER — Other Ambulatory Visit: Payer: Self-pay | Admitting: Family Medicine

## 2015-04-05 LAB — GC/CHLAMYDIA PROBE AMP
CT PROBE, AMP APTIMA: NOT DETECTED
GC Probe RNA: NOT DETECTED

## 2015-04-05 LAB — URINE CULTURE
Colony Count: NO GROWTH
Organism ID, Bacteria: NO GROWTH

## 2015-04-05 LAB — PSA: PSA: 0.78 ng/mL (ref <=4.00)

## 2015-04-05 NOTE — Telephone Encounter (Signed)
Left message for pt to call back  °

## 2015-04-05 NOTE — Telephone Encounter (Signed)
Advised pt of message. Pt understood. 

## 2015-04-05 NOTE — Telephone Encounter (Signed)
All labs and imaging are 100% normal.  I have placed a scrotal US and will also place a referral to urology.  Chart notes that pt does a h/o chronic groin pain so I suspect he is having a flair of that.  He can take ibuprofen  with food 3 times a day and acetaminophen 1g three times a day alternating.  If he continues to have worsening pain, he can go to the ER to see if he needs more urgent evaluation of the scrotum with US/dopplers and/or urology eval.

## 2015-04-07 ENCOUNTER — Encounter: Payer: Self-pay | Admitting: Family Medicine

## 2015-06-16 ENCOUNTER — Ambulatory Visit (INDEPENDENT_AMBULATORY_CARE_PROVIDER_SITE_OTHER): Payer: Medicare Other

## 2015-06-16 ENCOUNTER — Ambulatory Visit (INDEPENDENT_AMBULATORY_CARE_PROVIDER_SITE_OTHER): Payer: Medicare Other | Admitting: Physician Assistant

## 2015-06-16 VITALS — BP 132/84 | HR 86 | Temp 97.8°F | Resp 18 | Ht 71.0 in | Wt 160.2 lb

## 2015-06-16 DIAGNOSIS — R0602 Shortness of breath: Secondary | ICD-10-CM

## 2015-06-16 DIAGNOSIS — R0781 Pleurodynia: Secondary | ICD-10-CM

## 2015-06-16 DIAGNOSIS — S20211A Contusion of right front wall of thorax, initial encounter: Secondary | ICD-10-CM

## 2015-06-16 DIAGNOSIS — M62838 Other muscle spasm: Secondary | ICD-10-CM

## 2015-06-16 DIAGNOSIS — M546 Pain in thoracic spine: Secondary | ICD-10-CM | POA: Diagnosis not present

## 2015-06-16 DIAGNOSIS — W19XXXA Unspecified fall, initial encounter: Secondary | ICD-10-CM | POA: Diagnosis not present

## 2015-06-16 DIAGNOSIS — S3992XA Unspecified injury of lower back, initial encounter: Secondary | ICD-10-CM | POA: Diagnosis not present

## 2015-06-16 DIAGNOSIS — R079 Chest pain, unspecified: Secondary | ICD-10-CM | POA: Diagnosis not present

## 2015-06-16 MED ORDER — CYCLOBENZAPRINE HCL 5 MG PO TABS: 5.0000 mg | ORAL_TABLET | Freq: Three times a day (TID) | ORAL | Status: DC | PRN

## 2015-06-16 MED ORDER — HYDROCODONE-ACETAMINOPHEN 5-325 MG PO TABS
1.0000 | ORAL_TABLET | Freq: Four times a day (QID) | ORAL | Status: DC | PRN
Start: 2015-06-16 — End: 2015-07-03

## 2015-06-16 MED ORDER — DICLOFENAC SODIUM 75 MG PO TBEC: 75.0000 mg | DELAYED_RELEASE_TABLET | Freq: Two times a day (BID) | ORAL | Status: DC

## 2015-06-16 MED ORDER — DOCUSATE SODIUM 100 MG PO CAPS: 300.0000 mg | ORAL_CAPSULE | Freq: Every day | ORAL | Status: DC

## 2015-06-16 NOTE — Progress Notes (Signed)
Brent Libman Ambers Jr.  MRN: [id number] DOB: Apr 26, 1975  Subjective:  Pt presents to clinic after a fall 3 days ago where he feel about 5 feet out of the back of a truck when he lost his balance and landed on his back rolled partially onto his right shoulder.  When he landed he felt and heard a pop and since then he has had trouble with breathing.  He has pain with movement and he is having a hard time with taking deep breaths because of the pain. He used a motrin  and that did not help the pain much.  No current ETOH or drug use.  He had drug dependence years ago after he was put on Dilaudid for neck pain.  He has been off of all narcotic pain medication for several years.  Patient Active Problem List   Diagnosis Date Noted  . Mood disorder (HCC) 03/01/2014  . Suicidal ideation   . Pancreatitis, acute 03/01/2013  . Abdominal pain 03/01/2013  . Tobacco abuse disorder 08/29/2011  . PERS HX NONCOMPLIANCE W/MED TX PRS HAZARDS HLTH 05/08/2009  . TOBACCO USE 03/13/2009  . LYME DISEASE, HX OF 11/02/2008  . ABDOMINAL PAIN OTHER SPECIFIED SITE 10/11/2008  . SPINAL STENOSIS, CERVICAL 05/02/2008  . ANXIETY 11/19/2007  . History of drug dependence (HCC) 04/10/2007  . Depression 08/12/2006    Current Outpatient Prescriptions on File Prior to Visit  Medication Sig Dispense Refill  . ALPRAZolam (XANAX) 1 MG tablet Take 1 mg by mouth 4 (four) times daily.     Marland Kitchen ibuprofen (ADVIL,MOTRIN) 200 MG tablet Take 400-800 mg by mouth 2 (two) times daily as needed (pain).     No current facility-administered medications on file prior to visit.    Allergies  Allergen Reactions  . Iohexol Other (See Comments)     Desc: PER MD/PER PATIENT NOT ALLERGIC 06/11/05 RM/PER RADIOLOGIST GIVE PREMEDICATION 06/12/05     Review of Systems  Constitutional: Negative for fever and chills.  Respiratory: Positive for shortness of breath.    Objective:  BP 132/84 mmHg  Pulse 86  Temp(Src) 97.8 F (36.6 C) (Oral)   Resp 18  Ht  (1.803 m)  Wt 160 lb 3.2 oz (72.666 kg)  BMI 22.35 kg/m2  SpO2 98%  Physical Exam  Constitutional: He is oriented to person, place, and time and well-developed, well-nourished, and in no distress.  HENT:  Head: Normocephalic and atraumatic.  Right Ear: External ear normal.  Left Ear: External ear normal.  Eyes: Conjunctivae are normal.  Neck: Normal range of motion.  Cardiovascular: Normal rate, regular rhythm and normal heart sounds.   No murmur heard. Pulmonary/Chest: Effort normal and breath sounds normal.  Pain with deep breathing  Musculoskeletal:       Right shoulder: Normal.       Left shoulder: Normal.       Thoracic back: He exhibits decreased range of motion (2nd to pain), tenderness and bony tenderness.       Back:  Pt is sitting hunched over the right side holding his rib cage.  Generalized pain with rib rib palpation but no specific area of TTP -   No ecchymosis seen.  Neurological: He is alert and oriented to person, place, and time. Gait normal.  Skin: Skin is warm and dry.  Psychiatric: Mood, memory, affect and judgment normal.   Dg Ribs Unilateral W/chest Right  06/16/2015  CLINICAL DATA:  Fall.  Pain.  Shortness of breath. EXAM: RIGHT RIBS  AND CHEST - 3+ VIEW COMPARISON:  07/04/2014. FINDINGS: No acute bony abnormality identified. No evidence of displaced rib fracture. No evidence of pneumothorax. IMPRESSION: No acute abnormality. Electronically Signed   By: Maisie Fushomas  Register   On: 06/16/2015 12:26   Dg Thoracic Spine 2 View  06/16/2015  CLINICAL DATA:  Fall.  Pain. EXAM: THORACIC SPINE 2 VIEWS COMPARISON:  07/04/2014. FINDINGS: Paraspinal soft tissues are normal. No acute bony abnormality identified. No evidence of fracture. Normal mineralization alignment. IMPRESSION: Negative exam. Electronically Signed   By: Maisie Fushomas  Register   On: 06/16/2015 12:28    Assessment and Plan :  SOB (shortness of breath) - Plan: DG Ribs Unilateral W/Chest  Right, Care order/instruction  Right-sided thoracic back pain - Plan: DG Thoracic Spine 2 View  Fall, initial encounter  Rib pain on right side - Plan: DG Ribs Unilateral W/Chest Right  Rib contusion, right, initial encounter - Plan: diclofenac (VOLTAREN) 75 MG EC tablet, HYDROcodone-acetaminophen (NORCO/VICODIN) 5-325 MG tablet, docusate sodium (COLACE) 100 MG capsule  Muscle spasm - Plan: cyclobenzaprine (FLEXERIL) 5 MG tablet   Heat to the area.  Reminded patient the importance of deep breathing to prevent PNA.  We will treat his muscle spasm and pain over the next week or so as he heals.  If he is no better in 7-10 days or worse he needs to RTC - otherwise this will likely takes 2-3 weeks for complete resolution.  We discussed his past drug dependence but he felt like he would not have a problem with short term use and he states he was using the medication as Rx at the time of his dependence.  He agrees and understands the plan.  The [place] drug database was looked up and he has had nothing in 6 months and just prior to that he had 2 Rx for dental procedures in the last year.  Benny LennertSarah Weber PA-C  [facility] 06/16/2015 12:57 PM

## 2015-06-16 NOTE — Patient Instructions (Addendum)
Colace - stool softner - take 300mg  a day to prevent constipation  Warm compresses to the area  Recheck if no no improvement in 7-10 days   IF you received an x-ray today, you will receive an invoice from [facility]. Please contact [facility] at [phone] with questions or concerns regarding your invoice.   IF you received labwork today, you will receive an invoice from [facility]. Please contact [facility] at [phone] with questions or concerns regarding your invoice.   Our billing staff will not be able to assist you with questions regarding bills from these companies.  You will be contacted with the lab results as soon as they are available. The fastest way to get your results is to activate your My Chart account. Instructions are located on the last page of this paperwork. If you have not heard from us regarding the results in 2 weeks, please contact this office.

## 2015-07-03 ENCOUNTER — Ambulatory Visit (INDEPENDENT_AMBULATORY_CARE_PROVIDER_SITE_OTHER): Payer: Medicare Other | Admitting: Family Medicine

## 2015-07-03 ENCOUNTER — Ambulatory Visit (INDEPENDENT_AMBULATORY_CARE_PROVIDER_SITE_OTHER): Payer: Medicare Other

## 2015-07-03 VITALS — BP 128/80 | HR 71 | Temp 98.2°F | Resp 18 | Ht 71.0 in | Wt 161.6 lb

## 2015-07-03 DIAGNOSIS — G8929 Other chronic pain: Secondary | ICD-10-CM

## 2015-07-03 DIAGNOSIS — M898X1 Other specified disorders of bone, shoulder: Secondary | ICD-10-CM | POA: Diagnosis not present

## 2015-07-03 DIAGNOSIS — R109 Unspecified abdominal pain: Secondary | ICD-10-CM | POA: Diagnosis not present

## 2015-07-03 DIAGNOSIS — R0602 Shortness of breath: Secondary | ICD-10-CM

## 2015-07-03 DIAGNOSIS — M542 Cervicalgia: Secondary | ICD-10-CM | POA: Diagnosis not present

## 2015-07-03 DIAGNOSIS — M546 Pain in thoracic spine: Secondary | ICD-10-CM

## 2015-07-03 DIAGNOSIS — S4991XA Unspecified injury of right shoulder and upper arm, initial encounter: Secondary | ICD-10-CM | POA: Diagnosis not present

## 2015-07-03 DIAGNOSIS — M549 Dorsalgia, unspecified: Secondary | ICD-10-CM

## 2015-07-03 DIAGNOSIS — S199XXA Unspecified injury of neck, initial encounter: Secondary | ICD-10-CM | POA: Diagnosis not present

## 2015-07-03 DIAGNOSIS — M25511 Pain in right shoulder: Secondary | ICD-10-CM | POA: Diagnosis not present

## 2015-07-03 MED ORDER — MELOXICAM 7.5 MG PO TABS: 7.5000 mg | ORAL_TABLET | Freq: Every day | ORAL | Status: DC

## 2015-07-03 MED ORDER — HYDROCODONE-ACETAMINOPHEN 5-325 MG PO TABS: 1.0000 | ORAL_TABLET | Freq: Four times a day (QID) | ORAL | Status: DC | PRN

## 2015-07-03 NOTE — Patient Instructions (Addendum)
     IF you received an x-ray today, you will receive an invoice from [facility]. Please contact [facility] at [phone] with questions or concerns regarding your invoice.   IF you received labwork today, you will receive an invoice from [facility]. Please contact [facility] at [phone] with questions or concerns regarding your invoice.   Our billing staff will not be able to assist you with questions regarding bills from these companies.  You will be contacted with the lab results as soon as they are available. The fastest way to get your results is to activate your My Chart account. Instructions are located on the last page of this paperwork. If you have not heard from us regarding the results in 2 weeks, please contact this office.    We can try a different antiinflammatory - meloxicam (1-2 per day), but you will need to follow-up with an orthopedic specialist to determine next step in your treatment. It is possible you may need to follow back up with a pain management specialist, and this can be referred from here or orthopedist if they feel this is necessary.   I did write for a short course of pain medication until you're seen by orthopedics, but will be unable to provide long-term pain management.  Do not take these medications in excess of what has been prescribed, and do not take other narcotic pain medications that are not prescribed for you.  If you're feeling like you're becoming dependent or addicted to this medication again, return right away so that I can get you some help. Only take the medication as long as needed for pain treatment, and only for pain that is not improved with the anti-inflammatory.  Return to the clinic or go to the nearest emergency room if any of your symptoms worsen or new symptoms occur.

## 2015-07-03 NOTE — Progress Notes (Addendum)
By signing my name below I, [name], attest that this documentation has been prepared under the direction and in the presence of [name], MD. Electonically Signed. [name], Scribe 07/03/2015 at 10:00 AM   Subjective:    Patient ID: Brent Morales., male    DOB: 1975/06/29, 40 y.o.   MRN: [id number]  Chief Complaint  Patient presents with  . Follow-up    increase shoulder pain with difficulty breathing due to pain     HPI Brent Morales. is a 41 y.o. male who [facility] for a follow up on shoulder pain. Pt states that he still has pain from rt shoulder blade radiating to his neck. Pt states pain is still worsened by deep breathing.   Pt was seen 2 weeks ago with rt sided back pain, rt rib pain and SOB that started after a fall that occurred 3 days prior. Per pt's report from initial encounter, pt heard pop after falling out of the back of a truck landing on rt side and back. Pt was Dx with rib contusion and muscle spasm. Pt prescribed Voltaren, flexeril, and hydrocodone as needed. It was noted that pt has previous history of narcotic dependence. Planned for short course of narcotic medication 20 hydrocodone. Negative xray of ribs and thoracic spine.  Pt states that he took the flexeril for 4 days and started having a racing heart rate so he discontinued the medication. Pt states that he has been taking 3 hydrocodone a day. Pt states he took 1 every 6 hrs. Pt also states he took some of his aunts oxycodone for the pain after he ran out of his hydrocodone.   Pt acknowledges history of narcotic addiction in 2015.  Pt states he was being treated at [facility] pain management clinic for neck pain prior to incident and stopped going there after they did not prescribe the pt's preferred pain medication. Pt states he then went to preferred pain management and stopped going after having a negative experience from the neck injection they gave  him  Pt was last seen by Dr Neva Seat on November of 2015 after he was referred to pain management by Dr Neva Seat in October 2015.   Patient Active Problem List   Diagnosis Date Noted  . Mood disorder (HCC) 03/01/2014  . Suicidal ideation   . Pancreatitis, acute 03/01/2013  . Abdominal pain 03/01/2013  . Tobacco abuse disorder 08/29/2011  . PERS HX NONCOMPLIANCE W/MED TX PRS HAZARDS HLTH 05/08/2009  . TOBACCO USE 03/13/2009  . LYME DISEASE, HX OF 11/02/2008  . ABDOMINAL PAIN OTHER SPECIFIED SITE 10/11/2008  . SPINAL STENOSIS, CERVICAL 05/02/2008  . ANXIETY 11/19/2007  . History of drug dependence (HCC) 04/10/2007  . Depression 08/12/2006   Past Medical History  Diagnosis Date  . Hernia     Bilateral Inguinal  . Cervical stenosis of spinal canal   . Pancreatitis   . Syncope     April 2016  . Depression   . Anxiety   . Headache   . Arthritis   . Fibromyalgia    Past Surgical History  Procedure Laterality Date  . Inguinal hernia repair    . Thumb surgery    . Multiple extractions with alveoloplasty N/A 10/13/2014    Procedure: Extraction of tooth #'s 2,4,5,6,7,8,9,10,11,12,13,14,15, 40,98,11,91,47,82,95,62,13,08 with alveoloplasty and bilateral mandibular tori reductions;  Surgeon: Charlynne Pander, DDS;  Location: [facility] OR;  Service: Oral Surgery;  Laterality: N/A;  NASAL TUBE   Allergies  Allergen Reactions  . Iohexol Other (See Comments)     Desc: PER MD/PER PATIENT NOT ALLERGIC 06/11/05 RM/PER RADIOLOGIST GIVE PREMEDICATION 06/12/05    Prior to Admission medications   Medication Sig Start Date End Date Taking? Authorizing Provider  ALPRAZolam Prudy Feeler(XANAX) 1 MG tablet Take 1 mg by mouth 4 (four) times daily.    Yes Historical Provider, MD  diclofenac (VOLTAREN) 75 MG EC tablet Take 1 tablet (75 mg total) by mouth 2 (two) times daily. 06/16/15  Yes Morrell RiddleSarah L Weber, PA-C  docusate sodium (COLACE) 100 MG capsule Take 3 capsules (300 mg total) by mouth daily. 06/16/15  Yes Morrell RiddleSarah L Weber, PA-C   HYDROcodone-acetaminophen (NORCO/VICODIN) 5-325 MG tablet Take 1 tablet by mouth every 6 (six) hours as needed for moderate pain. 06/16/15  Yes Morrell RiddleSarah L Weber, PA-C  ibuprofen (ADVIL,MOTRIN) 200 MG tablet Take 400-800 mg by mouth 2 (two) times daily as needed (pain).   Yes Historical Provider, MD  cyclobenzaprine (FLEXERIL) 5 MG tablet Take 1 tablet (5 mg total) by mouth 3 (three) times daily as needed for muscle spasms. Patient not taking: Reported on 07/03/2015 06/16/15   Morrell RiddleSarah L Weber, PA-C   Social History   Social History  . Marital Status: Single    Spouse Name: N/A  . Number of Children: N/A  . Years of Education: N/A   Occupational History  . Not on file.   Social History Main Topics  . Smoking status: Current Every Day Smoker -- 1.00 packs/day for 25 years    Types: Cigarettes    Last Attempt to Quit: 10/01/2014  . Smokeless tobacco: Never Used     Comment: uses a nicotine patch  . Alcohol Use: No  . Drug Use: No  . Sexual Activity: Not on file   Other Topics Concern  . Not on file   Social History Narrative      Review of Systems  Respiratory: Positive for shortness of breath (due to shoulder pain upon deep breaths).   Musculoskeletal:       Positive for rt shoulder blade pain.        Objective:   Physical Exam  Constitutional: He is oriented to person, place, and time. He appears well-developed and well-nourished. No distress.  HENT:  Head: Normocephalic and atraumatic.  Eyes: Conjunctivae are normal. Pupils are equal, round, and reactive to light.  Neck: Spinous process tenderness and muscular tenderness present. Decreased range of motion present.  Diffuse midline and paraspinal cervical spine tenderness. Minimal guarded flexion. 10-15 degrees extension 15-20 degrees rt rotation 10-15 degrees left rotation  Cardiovascular: Normal rate.   Pulmonary/Chest: Effort normal and breath sounds normal. He has no decreased breath sounds. He has no wheezes. He has  no rhonchi. He has no rales.  Musculoskeletal:  Diffuse tenderness along upper thoracic region.  Neurological: He is alert and oriented to person, place, and time.  Skin: Skin is warm and dry.  Psychiatric: He has a normal mood and affect. His behavior is normal.  Nursing note and vitals reviewed.    Filed Vitals:   07/03/15 0858  BP: 128/80  Pulse: 71  Temp: 98.2 F (36.8 C)  TempSrc: Oral  Resp: 18  Height: 5\' 11"  (1.803 m)  Weight: 161 lb 9.6 oz (73.301 kg)  SpO2: 97%    Controlled substance database reviewed. Last prescription for 20 hydrocodone 06/16/15 correlates with previous history.   Dg Chest 2 View  07/03/2015  CLINICAL DATA:  Right flank pain. EXAM: CHEST  2 VIEW COMPARISON:  No prior. FINDINGS: Mediastinum and hilar structures normal. Lungs are clear. Heart size normal. No pleural effusion or pneumothorax. IMPRESSION: No acute cardiopulmonary disease. Electronically Signed   By: Maisie Fus  Register   On: 07/03/2015 10:21   Dg Cervical Spine Complete  07/03/2015  CLINICAL DATA:  Shoulder and next pain.  Fall. EXAM: CERVICAL SPINE - COMPLETE 4+ VIEW COMPARISON:  06/16/2015. FINDINGS: No acute bony abnormality identified. No evidence of fracture or dislocation. Pulmonary apices are clear. IMPRESSION: No acute abnormality identified. Electronically Signed   By: Maisie Fus  Register   On: 07/03/2015 10:19   Dg Scapula Right  07/03/2015  CLINICAL DATA:  Right shoulder pain.  Fall. EXAM: RIGHT SCAPULA - 2+ VIEWS COMPARISON:  07/03/2015 . FINDINGS: No acute bony or joint abnormality identified. No evidence of fracture or dislocation. IMPRESSION: No acute abnormality. Electronically Signed   By: Maisie Fus  Register   On: 07/03/2015 10:22       Assessment & Plan:   [name] Placencia Montez Hageman. is a 40 y.o. male Chronic neck pain - Plan: DG Cervical Spine Complete, meloxicam (MOBIC) 7.5 MG tablet, HYDROcodone-acetaminophen (NORCO/VICODIN) 5-325 MG tablet, Ambulatory referral to Orthopedic  Surgery  Upper back pain on right side - Plan: DG Cervical Spine Complete, meloxicam (MOBIC) 7.5 MG tablet, HYDROcodone-acetaminophen (NORCO/VICODIN) 5-325 MG tablet, Ambulatory referral to Orthopedic Surgery  Pain of right scapula - Plan: DG Scapula Right, meloxicam (MOBIC) 7.5 MG tablet, HYDROcodone-acetaminophen (NORCO/VICODIN) 5-325 MG tablet, Ambulatory referral to Orthopedic Surgery  Shortness of breath - Plan: DG Chest 2 View   History of chronic neck pain with likely flare of previous pain along with contusion and upper back pain from fall 3 weeks ago. Reported pain with deep inspiration, but O2 sat normal, normal effort on exam, exam reassuring, chest x-ray without concerning findings. Possible upper back contusion versus rib contusion, scapula contusion, no fracture identified on x-ray.   -Due to persistent right-sided upper back pain/scapular pain, will refer to orthopedics. This may be referred pain from his neck with history of chronic neck pain, but can decide on further imaging such as MRI at that time. He did bring with him an MRI from 2015 on CD. Advised to bring this with him to orthopedics.  -Change Voltaren to meloxicam 7.5 mg 1-2 daily. Stop Flexeril as intolerant due to side effects.  -Agreed to refill hydrocodone for #20 today for breakthrough pain. Cautioned on overuse or persistent use, and if any symptoms of addiction or difficulty stopping this medicine, advised to return right away so I can get him some help for this.   -Discussed if persistent narcotic medicine needed, would need to follow-up with another pain management provider. Would have to look into other options as he has been to 2 offices in town and does not want to go back to those. This can also be discussed with orthopedics.  - ER/RTC precautions.   Meds ordered this encounter  Medications  . meloxicam (MOBIC) 7.5 MG tablet    Sig: Take 1-2 tablets (7.5-15 mg total) by mouth daily.    Dispense:  30 tablet     Refill:  0  . HYDROcodone-acetaminophen (NORCO/VICODIN) 5-325 MG tablet    Sig: Take 1 tablet by mouth every 6 (six) hours as needed for moderate pain.    Dispense:  20 tablet    Refill:  0   Patient Instructions       IF you received an x-ray today,  you will receive an invoice from [facility]. Please contact [facility] at [phone] with questions or concerns regarding your invoice.   IF you received labwork today, you will receive an invoice from [facility]. Please contact [facility] at [phone] with questions or concerns regarding your invoice.   Our billing staff will not be able to assist you with questions regarding bills from these companies.  You will be contacted with the lab results as soon as they are available. The fastest way to get your results is to activate your My Chart account. Instructions are located on the last page of this paperwork. If you have not heard from [place] regarding the results in 2 weeks, please contact this office.    We can try a different antiinflammatory - meloxicam (1-2 per day), but you will need to follow-up with an orthopedic specialist to determine next step in your treatment. It is possible you may need to follow back up with a pain management specialist, and this can be referred from here or orthopedist if they feel this is necessary.   I did write for a short course of pain medication until you're seen by orthopedics, but will be unable to provide long-term pain management.  Do not take these medications in excess of what has been prescribed, and do not take other narcotic pain medications that are not prescribed for you.  If you're feeling like you're becoming dependent or addicted to this medication again, return right away so that I can get you some help. Only take the medication as long as needed for pain treatment, and only for pain that is not improved with the anti-inflammatory.  Return to  the clinic or go to the nearest emergency room if any of your symptoms worsen or new symptoms occur.         I personally performed the services described in this documentation, which was scribed in my presence. The recorded information has been reviewed and considered, and addended by me as needed.

## 2015-07-06 ENCOUNTER — Telehealth: Payer: Self-pay

## 2015-07-06 DIAGNOSIS — G8929 Other chronic pain: Secondary | ICD-10-CM

## 2015-07-06 DIAGNOSIS — M549 Dorsalgia, unspecified: Secondary | ICD-10-CM

## 2015-07-06 DIAGNOSIS — M542 Cervicalgia: Principal | ICD-10-CM

## 2015-07-06 DIAGNOSIS — M898X1 Other specified disorders of bone, shoulder: Secondary | ICD-10-CM

## 2015-07-06 DIAGNOSIS — M5412 Radiculopathy, cervical region: Secondary | ICD-10-CM | POA: Diagnosis not present

## 2015-07-06 NOTE — Telephone Encounter (Signed)
The patient would like a new prescription for his shoulder pain.  He was originally prescribed HYDROcodone-acetaminophen (NORCO/VICODIN) 5-325 MG tablet.  [facility] and is scheduled for an MRI on May 5.  The orthopedist told him to ask for a new prescription for pain medication from the original prescriber, Dr Neva SeatGreene.  The patient states that he is in severe pain and cannot sleep well due to the shoulder pain.  Please advise, thank you.  CB#: [phone]

## 2015-07-06 NOTE — Telephone Encounter (Signed)
Dr. Greene  Please see previous message 

## 2015-07-07 MED ORDER — HYDROCODONE-ACETAMINOPHEN 5-325 MG PO TABS: 1.0000 | ORAL_TABLET | Freq: Four times a day (QID) | ORAL | Status: DC | PRN

## 2015-07-07 NOTE — Telephone Encounter (Signed)
Refilled once, but if further refills needed will need to RTC to discuss plan, including pain management as we discussed at last visit.

## 2015-07-07 NOTE — Telephone Encounter (Signed)
Notified pt of RF and also need for f/up if another RF is needed. Pt agreed.

## 2015-07-14 DIAGNOSIS — M542 Cervicalgia: Secondary | ICD-10-CM | POA: Diagnosis not present

## 2015-07-17 DIAGNOSIS — M47812 Spondylosis without myelopathy or radiculopathy, cervical region: Secondary | ICD-10-CM | POA: Diagnosis not present

## 2015-07-17 DIAGNOSIS — M542 Cervicalgia: Secondary | ICD-10-CM | POA: Diagnosis not present

## 2015-07-31 DIAGNOSIS — M47812 Spondylosis without myelopathy or radiculopathy, cervical region: Secondary | ICD-10-CM | POA: Diagnosis not present

## 2015-07-31 DIAGNOSIS — G894 Chronic pain syndrome: Secondary | ICD-10-CM | POA: Diagnosis not present

## 2015-07-31 DIAGNOSIS — Z79891 Long term (current) use of opiate analgesic: Secondary | ICD-10-CM | POA: Diagnosis not present

## 2015-07-31 DIAGNOSIS — Z79899 Other long term (current) drug therapy: Secondary | ICD-10-CM | POA: Diagnosis not present

## 2015-08-20 ENCOUNTER — Encounter (HOSPITAL_COMMUNITY): Payer: Self-pay | Admitting: Emergency Medicine

## 2015-08-20 ENCOUNTER — Emergency Department (HOSPITAL_COMMUNITY)
Admission: EM | Admit: 2015-08-20 | Discharge: 2015-08-20 | Disposition: A | Discharge status: Home / Self Care | Payer: Medicare Other | Attending: Emergency Medicine | Admitting: Emergency Medicine

## 2015-08-20 DIAGNOSIS — R45850 Homicidal ideations: Secondary | ICD-10-CM | POA: Diagnosis present

## 2015-08-20 DIAGNOSIS — Z79899 Other long term (current) drug therapy: Secondary | ICD-10-CM | POA: Diagnosis not present

## 2015-08-20 DIAGNOSIS — F17210 Nicotine dependence, cigarettes, uncomplicated: Secondary | ICD-10-CM | POA: Diagnosis not present

## 2015-08-20 DIAGNOSIS — F1110 Opioid abuse, uncomplicated: Secondary | ICD-10-CM | POA: Diagnosis not present

## 2015-08-20 LAB — CBC
HEMATOCRIT: 45.2 % (ref 39.0–52.0)
Hemoglobin: 15.3 g/dL (ref 13.0–17.0)
MCH: 31.2 pg (ref 26.0–34.0)
MCHC: 33.8 g/dL (ref 30.0–36.0)
MCV: 92.2 fL (ref 78.0–100.0)
Platelets: 260 10*3/uL (ref 150–400)
RBC: 4.9 MIL/uL (ref 4.22–5.81)
RDW: 13.6 % (ref 11.5–15.5)
WBC: 7.9 10*3/uL (ref 4.0–10.5)

## 2015-08-20 LAB — COMPREHENSIVE METABOLIC PANEL
ALBUMIN: 4 g/dL (ref 3.5–5.0)
ALK PHOS: 98 U/L (ref 38–126)
ALT: 18 U/L (ref 17–63)
AST: 22 U/L (ref 15–41)
Anion gap: 7 (ref 5–15)
BILIRUBIN TOTAL: 0.6 mg/dL (ref 0.3–1.2)
CALCIUM: 9.4 mg/dL (ref 8.9–10.3)
CO2: 24 mmol/L (ref 22–32)
CREATININE: 0.76 mg/dL (ref 0.61–1.24)
Chloride: 108 mmol/L (ref 101–111)
GFR calc Af Amer: >60 mL/min (ref >60)
GLUCOSE: 99 mg/dL (ref 65–99)
POTASSIUM: 3.9 mmol/L (ref 3.5–5.1)
Sodium: 139 mmol/L (ref 135–145)
TOTAL PROTEIN: 6.7 g/dL (ref 6.5–8.1)

## 2015-08-20 LAB — SALICYLATE LEVEL: Salicylate Lvl: <4 mg/dL (ref 2.8–30.0)

## 2015-08-20 LAB — ETHANOL

## 2015-08-20 LAB — ACETAMINOPHEN LEVEL: Acetaminophen (Tylenol), Serum: <10 ug/mL — ABNORMAL LOW (ref 10–30)

## 2015-08-20 MED ORDER — ONDANSETRON 4 MG PO TBDP: 4.0000 mg | ORAL_TABLET | Freq: Three times a day (TID) | ORAL | Status: DC | PRN

## 2015-08-20 MED ORDER — CLONIDINE HCL 0.1 MG PO TABS: 0.1000 mg | ORAL_TABLET | Freq: Four times a day (QID) | ORAL | Status: DC

## 2015-08-20 MED ORDER — NICOTINE 21 MG/24HR TD PT24
21.0000 mg | MEDICATED_PATCH | Freq: Every day | TRANSDERMAL | Status: DC
  Administered 2015-08-20: 21 mg via TRANSDERMAL
  Filled 2015-08-20: qty 1

## 2015-08-20 MED ORDER — ALUM & MAG HYDROXIDE-SIMETH 200-200-20 MG/5ML PO SUSP
30.0000 mL | ORAL | Status: DC | PRN
  Administered 2015-08-20: 30 mL via ORAL
  Filled 2015-08-20: qty 30

## 2015-08-20 MED ORDER — LORAZEPAM 1 MG PO TABS: 1.0000 mg | ORAL_TABLET | Freq: Three times a day (TID) | ORAL | Status: DC | PRN

## 2015-08-20 MED ORDER — ACETAMINOPHEN 325 MG PO TABS: 650.0000 mg | ORAL_TABLET | ORAL | Status: DC | PRN

## 2015-08-20 MED ORDER — METHOCARBAMOL 500 MG PO TABS: 500.0000 mg | ORAL_TABLET | Freq: Four times a day (QID) | ORAL | Status: DC | PRN

## 2015-08-20 MED ORDER — HYDROXYZINE HCL 25 MG PO TABS: 25.0000 mg | ORAL_TABLET | Freq: Four times a day (QID) | ORAL | Status: DC | PRN

## 2015-08-20 MED ORDER — LOPERAMIDE HCL 2 MG PO CAPS: 2.0000 mg | ORAL_CAPSULE | Freq: Four times a day (QID) | ORAL | Status: DC | PRN

## 2015-08-20 NOTE — Discharge Instructions (Signed)
Please read and follow all provided instructions.  Your diagnoses today include:  1. Opiate abuse, continuous    Tests performed today include:  Blood counts and electrolytes  Vital signs. See below for your results today.   Home care instructions:  Follow any educational materials contained in this packet.  Follow-up instructions: Please follow-up with your primary care provider in the next 3 days for further evaluation of your symptoms.   Return instructions:   Please return to the Emergency Department if you experience worsening symptoms.   Please return if you have any other emergent concerns.  Additional Information:  Your vital signs today were: BP 138/84 mmHg   Pulse 69   Temp(Src) 97.9 F (36.6 C) (Oral)   Resp 16   Ht 6' (1.829 m)   Wt 70.875 kg   BMI 21.19 kg/m2   SpO2 98% If your blood pressure (BP) was elevated above 135/85 this visit, please have this repeated by your doctor within one month. -------------- [facility] Inpatient Behavioral Health/Residential  Substance Abuse Treatment Adults The United Ways 211 is a great source of information about community services available.  Access by dialing 2-1-1 from anywhere in [place], or by website -  PooledIncome.pl.   (Updated 03/2015)  Crisis Assistance 24 hours a day   Services Offered    Area [employer]  24-hour crisis assistance: 256-763-0716 [place], [place]   [facility]  24-hour crisis [phone] 679-0049 [place], [place]  [place]   24-hour crisis assistance: (925) 128-2641 [place], [place]   [facility] Access to Care Line  24-hour crisis assistance; (434) 323-6771 All   [facility]  24-hour crisis response line: 2695723198 All   Other Local Resources (Updated 03/2015)  Inpatient Behavioral Health/Residential Substance Abuse Treatment Programs   Services      Address and Phone Number    [facility] ([facility])   14-day residential rehabilitation  [phone] [address] [place], [place]  [facility] ([facility])    Detox - private pay only  14-day residential rehabilitation -  Medicaid, insurance, private pay only [phone], or [phone] [address] [place], [place] [address]   [facility] [employer] only  Multiple facilities [phone] admissions   [facility] ([facility])   90-day program  Must be homeless to participate  6360868831, or [phone] Marcy Panning, [facility]  [facility] only [phone], or  [phone] [address] [place], [place] [address]  [facility]     Must make an appointment  Transportation is offered from [place] on [employer].  Accepts private pay, Sheryn Bison [facility] [phone]  [address] [place], [place] [address]   [employer]  Females only  Associated with the [facility] [phone] [address] [place], [place] [address]  [facility] only [phone], or [phone] [address] [place], [id number]  [facility]    Detox  Residential rehabilitation  Private insurance only  Multiple locations [phone] admissions  [facility]    Private pay  Private insurance [phone] [address] [place], [place] [address]  [facility]    Males only  Fee required at time of admission [phone] [address] [place], [place] [address]  [facility]    Private pay only  [phone] [address] [place], [place]  RTS (Civil Service fast streamer)  Detox - private pay, Medicaid  Residential rehabilitation for males  - Medicare, Medicaid, insurance, private pay [phone] [address] [place], [place]   [facility]    Walk-in  interviews Monday - Saturday from 8 am - 4 pm  Individuals with legal charges are not eligible [phone] [address] [place], [place] [address]  [facility]   Must be willing to work  Must attend Alcoholics Anonymous meetings [phone] [address] [place], [place]   [facility] [employer]    Faith-based program  Private pay only [phone] [address] [place], [place]    Community Resource Guide Outpatient Counseling/Substance Abuse Adult The United Ways 211 is a great source of information about community services available.  Access by dialing 2-1-1 from anywhere in [place], or by website -  PooledIncome.pl.   Other Local Resources (Updated 03/2015)  Crisis Hotlines   Services     Area Served  [employer]  Crisis Hotline, available 24 hours a day, 7 days a week: 3154139182 [facility], [place]   [facility]  Crisis Hotline, available 24 hours a day, 7 days a week: 310-658-4036 [facility], [place]  [facility]  Suicide Prevention Hotline, available 24 hours a day, 7 days a week: 864-678-7263 [facility], [place]  [employer], available 24 hours a day, 7 days a week: 518-003-2650 [facility], [place]   [facility] Access to [employer], available 24 hours a day, 7 days a week: (601)328-7128 All   [facility]  Crisis Hotline, available 24 hours a day, 7 days a week: [phone] All   Other Local Resources (Updated 03/2015)  Outpatient Counseling/ Substance Abuse Programs  Services     Address and Phone Number  [facility] ([facility])   Options include Individual counseling, group counseling, intensive outpatient program (several hours a day, several days a week)  Offers depression assessments  Provides methadone maintenance program [phone] [address] [place], [place] [address]   [facility]sel[facility]   Offers partial hospitalization/day treatment and DUI/DWI programs  [employer], private insurance [phone] [address] [place], [place] [address]  [facility]    Services include intensive outpatient program (several hours a day, several days a week), outpatient treatment, DUI/DWI services, family education  Also has some services specifically for [employer] transitional housing  [phone] [address] [place], [place] [address]     [facility]  [employer], private pay, and private insurance [phone] [address] [place], [place] [address]  [employer] of Care  Services include individual counseling, substance abuse intensive outpatient program (several hours a day, several days a week), day treatment  Delene Loll, Medicaid, private insurance [phone] [address] [place], [place] [address]  [facility]   Offers substance abuse intensive outpatient program (several hours a day, several days a week), partial hospitalization program [phone] [address] [place], [place] [address]  [phone] [address] [place], [place] [address]  [phone] [address] [place], [place] [address]  [phone], [address] [place], [place] [address]  [facility]  Individual counseling only  Accepts private insurance only [phone] [address] [place], [place] [address]  [facility]  Methadone maintenance program [phone] [address] [place], [place] [address]  [facility]  Walk-In Clinic providing substance abuse  and mental health counseling  Accepts Medicaid, Medicare, private insurance  Offers sliding scale for uninsured [phone] [address] [place], [place]   [facility] in [place], [employer].  Offers individual counseling,  and intensive in-home services [phone] [address] [place], [place] [address]  [facility] individual counseling, family counseling, group therapy, domestic violence counseling, consumer credit counseling  Accepts Medicare, Medicaid, private insurance  Offers sliding scale for uninsured [phone] [address] [place], [place] [address]  [phone] [facility], [address] [place], [place] [address]  [facility]  Offers individual, family and group counseling  3 locations - [place], [place], and [place]  [phone]  [address] [place], [place] [address]  [address] [place], [place] [address]  [address] [place], [place] [address]  [facility]    Offers psychiatric assessment, 8-week Intensive Outpatient Program (several hours a day, several times a week, daytime or evenings), early recovery group, family Program, medication management  Private pay or private insurance only [phone], or  [phone] [address] [place], [place] [address]  [place] [employer] individual, couples and family counseling  Accepts Medicaid, private insurance, and sliding scale for uninsured [phone] [address] [place], [place] [address]  Len Blalock, MD  Individual counseling  Private insurance [phone] [address] [place], [place] [address]  [facility]   Offers assessment, substance abuse treatment, and behavioral health treatment [phone] [address] [place], [place] [address]  [facility]  Individual counseling  Accepts private insurance [phone] [address] [place], [place] [address]  [facility]  Individual counseling  Delene Loll, private insurance [phone] [address] [place], [place] [address]  [facility]    Offers intensive outpatient program  (several hours a day, several times a week)  Private pay, private insurance [phone] [facility] [place], [place]  [facility]  Individual counseling  Medicare, private insurance [phone] [address] [place], [place] [address]  [facility]    Offers intensive outpatient program (several hours a day, several times a week) and partial hospitalization program [phone] [address] [place], [place] [address]  Emerson Monte, MD  Individual counseling [phone] [address] [place], [place] [address]  [facility]  Offers Christian counseling to individuals, couples, and families  Accepts Medicare and private insurance; offers sliding scale for uninsured [phone] [address] [place], [place] [address]  [facility]  [place] counseling [phone] [address] [place], [place] [address]  [facility] crisis counseling, individual counseling, group therapy, in-home therapy, domestic violence services, day treatment, DWI services, Administrator, arts (CST), Assertive Community Treatment Team (ACTT), substance abuse Intensive Outpatient Program (several hours a day, several times a week)  2 locations - [place] and [place] [phone] [address] [place], [place] [address]  [phone] [address] [place], [place] [address]  [facility]     Individual counseling and group therapy  Accepts private insurance, [place], [place] [phone] [address] [place], [place]  [facility]  Offers individual and family counseling  Offers LGBTQ services  Accepts private insurance and private pay [phone] [address] [place], [place] [address]  [facility]    Offers individual counseling, group therapy, and outpatient detox  Accepts private  insurance [phone] [address] [place], [place]  [facility]  Individual counseling  Accepts Medicare, private insurance [phone] [address] [place], [place] [address]  [facility]  Individual counseling  Accepts Medicare, private insurance [phone] [address] [place], [place] [address]  [facility]phan[facility]   Offers substance abuse Intensive Outpatient Program (several hours a day, several times a week) [phone], or [phone] [place], [place]
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
## 2015-08-20 NOTE — ED Notes (Signed)
Pt departed in NAD.  

## 2015-08-20 NOTE — ED Notes (Signed)
Pt. reports homicidal ideation for several days  , pt. did not disclose plan of homicide at triage , denies suicidal ideation /no hallucination .
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
## 2015-08-20 NOTE — ED Notes (Signed)
Staffing office and charge nurse notified for pt.'s sitter , purple scrubs given to pt. , security notified to wand pt.  
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
## 2015-08-28 DIAGNOSIS — M47812 Spondylosis without myelopathy or radiculopathy, cervical region: Secondary | ICD-10-CM | POA: Diagnosis not present

## 2015-08-28 DIAGNOSIS — Z79891 Long term (current) use of opiate analgesic: Secondary | ICD-10-CM | POA: Diagnosis not present

## 2015-08-28 DIAGNOSIS — G894 Chronic pain syndrome: Secondary | ICD-10-CM | POA: Diagnosis not present

## 2015-08-28 DIAGNOSIS — Z79899 Other long term (current) drug therapy: Secondary | ICD-10-CM | POA: Diagnosis not present

## 2015-08-28 DIAGNOSIS — R51 Headache: Secondary | ICD-10-CM | POA: Diagnosis not present

## 2015-09-01 ENCOUNTER — Ambulatory Visit (INDEPENDENT_AMBULATORY_CARE_PROVIDER_SITE_OTHER): Payer: Medicare Other | Admitting: Emergency Medicine

## 2015-09-01 ENCOUNTER — Ambulatory Visit (INDEPENDENT_AMBULATORY_CARE_PROVIDER_SITE_OTHER): Payer: Medicare Other

## 2015-09-01 VITALS — BP 142/88 | HR 79 | Temp 98.7°F | Resp 17 | Ht 72.0 in | Wt 155.0 lb

## 2015-09-01 DIAGNOSIS — S6992XA Unspecified injury of left wrist, hand and finger(s), initial encounter: Secondary | ICD-10-CM | POA: Diagnosis not present

## 2015-09-01 DIAGNOSIS — S61402A Unspecified open wound of left hand, initial encounter: Secondary | ICD-10-CM

## 2015-09-01 DIAGNOSIS — M79641 Pain in right hand: Secondary | ICD-10-CM | POA: Diagnosis not present

## 2015-09-01 DIAGNOSIS — S61401A Unspecified open wound of right hand, initial encounter: Secondary | ICD-10-CM

## 2015-09-01 DIAGNOSIS — M79642 Pain in left hand: Secondary | ICD-10-CM | POA: Diagnosis not present

## 2015-09-01 DIAGNOSIS — Z23 Encounter for immunization: Secondary | ICD-10-CM | POA: Diagnosis not present

## 2015-09-01 DIAGNOSIS — S6991XA Unspecified injury of right wrist, hand and finger(s), initial encounter: Secondary | ICD-10-CM | POA: Diagnosis not present

## 2015-09-01 NOTE — Patient Instructions (Addendum)
Clean the wounds on both hands with soap and water twice a day. Apply antibiotic ointment twice a day. Keep your hands elevated and apply ice to the areas of swelling.    IF you received an x-ray today, you will receive an invoice from [facility]. Please contact [facility] at [phone] with questions or concerns regarding your invoice.   IF you received labwork today, you will receive an invoice from [facility]. Please contact [facility] at [phone] with questions or concerns regarding your invoice.   Our billing staff will not be able to assist you with questions regarding bills from these companies.  You will be contacted with the lab results as soon as they are available. The fastest way to get your results is to activate your My Chart account. Instructions are located on the last page of this paperwork. If you have not heard from us regarding the results in 2 weeks, please contact this office.

## 2015-09-01 NOTE — Progress Notes (Signed)
By signing my name below, I, Mesha Guinyard, attest that this documentation has been prepared under the direction and in the presence of Meredith StaggersJeffrey Greene, MD.  Electronically Signed: Arvilla MarketMesha Guinyard, Medical Scribe. 09/01/2015. 4:49 PM.  Subjective:    Patient ID: Brent BullocksHarold E Zhao Jr., male    DOB: Feb 05, 1976, 40 y.o.   MRN: [id number]  HPI Chief Complaint  Patient presents with  . Hand Injury    punched wall    HPI Comments: Brent BullocksHarold E Flahive Jr. is a 40 y.o. male who presents to the [facility] complaining ofPain in both hands after becoming angry and striking a wooden door with both hands. He presents with pain especially over the third MCP joint of both hands. Patient states he is on disability due to spurs in his neck. He lives with his mother. He has been referred to preferred pain management. Patient states he has had issues with anger in the past. He has been on Xanax and oxycodone in the past.  Patient Active Problem List   Diagnosis Date Noted  . Mood disorder (HCC) 03/01/2014  . Suicidal ideation   . Pancreatitis, acute 03/01/2013  . Abdominal pain 03/01/2013  . Tobacco abuse disorder 08/29/2011  . PERS HX NONCOMPLIANCE W/MED TX PRS HAZARDS HLTH 05/08/2009  . TOBACCO USE 03/13/2009  . LYME DISEASE, HX OF 11/02/2008  . ABDOMINAL PAIN OTHER SPECIFIED SITE 10/11/2008  . SPINAL STENOSIS, CERVICAL 05/02/2008  . ANXIETY 11/19/2007  . History of drug dependence (HCC) 04/10/2007  . Depression 08/12/2006   Past Medical History  Diagnosis Date  . Hernia     Bilateral Inguinal  . Cervical stenosis of spinal canal   . Pancreatitis   . Syncope     April 2016  . Depression   . Anxiety   . Headache   . Arthritis   . Fibromyalgia    Past Surgical History  Procedure Laterality Date  . Inguinal hernia repair    . Thumb surgery    . Multiple extractions with alveoloplasty N/A 10/13/2014    Procedure: Extraction of tooth #'s 2,4,5,6,7,8,9,10,11,12,13,14,15,  40,98,11,91,47,82,95,62,13,0819,20,21,22,23,24,25,26,27,28 with alveoloplasty and bilateral mandibular tori reductions;  Surgeon: Charlynne Panderonald F Kulinski, DDS;  [facility];  Service: Oral Surgery;  Laterality: N/A;  NASAL TUBE   Allergies  Allergen Reactions  . Iohexol Other (See Comments)     Desc: PER MD/PER PATIENT NOT ALLERGIC 06/11/05 RM/PER RADIOLOGIST GIVE PREMEDICATION 06/12/05    Prior to Admission medications   Medication Sig Start Date End Date Taking? Authorizing Provider  ALPRAZolam Prudy Feeler(XANAX) 1 MG tablet Take 1 mg by mouth 4 (four) times daily.    Yes Historical Provider, MD  cloNIDine (CATAPRES) 0.1 MG tablet Take 1 tablet (0.1 mg total) by mouth QID. Patient not taking: Reported on 09/01/2015 08/20/15   Renne CriglerJoshua Geiple, PA-C  hydrOXYzine (ATARAX/VISTARIL) 25 MG tablet Take 1 tablet (25 mg total) by mouth every 6 (six) hours as needed for anxiety or itching. Patient not taking: Reported on 09/01/2015 08/20/15   Renne CriglerJoshua Geiple, PA-C  ibuprofen (ADVIL,MOTRIN) 200 MG tablet Take 400-800 mg by mouth 2 (two) times daily as needed for moderate pain. Reported on 09/01/2015    Historical Provider, MD  loperamide (IMODIUM) 2 MG capsule Take 1 capsule (2 mg total) by mouth 4 (four) times daily as needed for diarrhea or loose stools. Patient not taking: Reported on 09/01/2015 08/20/15   Renne CriglerJoshua Geiple, PA-C  methocarbamol (ROBAXIN) 500 MG tablet Take 1 tablet (500 mg total) by  mouth every 6 (six) hours as needed for muscle spasms. Patient not taking: Reported on 09/01/2015 08/20/15   Renne CriglerJoshua Geiple, PA-C  ondansetron (ZOFRAN ODT) 4 MG disintegrating tablet Take 1 tablet (4 mg total) by mouth every 8 (eight) hours as needed for nausea or vomiting. Patient not taking: Reported on 09/01/2015 08/20/15   Renne CriglerJoshua Geiple, PA-C  oxyCODONE-acetaminophen (PERCOCET) 10-325 MG tablet Take 1 tablet by mouth every 6 (six) hours. Reported on 09/01/2015    Historical Provider, MD   Social History   Social History  . Marital Status: Single    Spouse Name:  N/A  . Number of Children: N/A  . Years of Education: N/A   Occupational History  . Not on file.   Social History Main Topics  . Smoking status: Current Every Day Smoker -- 3.50 packs/day for 22 years    Types: Cigarettes    Last Attempt to Quit: 10/01/2014  . Smokeless tobacco: Never Used     Comment: uses a nicotine patch  . Alcohol Use: No  . Drug Use: No  . Sexual Activity: Not on file   Other Topics Concern  . Not on file   Social History Narrative   Review of Systems   Objective:  BP 142/88 mmHg  Pulse 79  Temp(Src) 98.7 F (37.1 C) (Oral)  Resp 17  Ht 6' (1.829 m)  Wt 155 lb (70.308 kg)  BMI 21.02 kg/m2  SpO2 99%  Physical Exam  There is bruising over the distal third MCPs of both hands. There are superficial abrasions over the knuckles of both hands. Is able to flex next and the fingers of both hands. Dg Hand Complete Left  09/01/2015  CLINICAL DATA:  Pain post blunt trauma EXAM: LEFT HAND - COMPLETE 3+ VIEW COMPARISON:  06/09/2009 FINDINGS: There is no evidence of fracture or dislocation. There is no evidence of arthropathy or other focal bone abnormality. Soft tissues are unremarkable. IMPRESSION: Negative. Electronically Signed   By: Corlis Leak  Hassell M.D.   On: 09/01/2015 18:04   Dg Hand Complete Right  09/01/2015  CLINICAL DATA:  Pain post blunt trauma EXAM: RIGHT HAND - COMPLETE 3+ VIEW COMPARISON:  02/07/2009 FINDINGS: There is no evidence of fracture or dislocation. There is no evidence of arthropathy or other focal bone abnormality. Soft tissues are unremarkable. IMPRESSION: Negative. Electronically Signed   By: Corlis Leak  Hassell M.D.   On: 09/01/2015 18:04    Assessment & Plan:  No evidence of fracture on x-ray. He has abrasions over both hands. He is to keep these areas clean with soap and water. Dry sterile dressing applied. Patient states she is not going to hurt himself or anyone else. He is due to go to pain management. He is currently not under the care of a  psychiatrist due to insurance reasons.I personally performed the services described in this documentation, which was scribed in my presence. The recorded information has been reviewed and is accurate.no prescriptions written.I personally performed the services described in this documentation, which was scribed in my presence. The recorded information has been reviewed and is accurate.  Lucilla EdinSteve a Lanis Storlie MD

## 2015-09-13 DIAGNOSIS — Z79891 Long term (current) use of opiate analgesic: Secondary | ICD-10-CM | POA: Diagnosis not present

## 2015-09-13 DIAGNOSIS — R51 Headache: Secondary | ICD-10-CM | POA: Diagnosis not present

## 2015-09-13 DIAGNOSIS — G894 Chronic pain syndrome: Secondary | ICD-10-CM | POA: Diagnosis not present

## 2015-09-13 DIAGNOSIS — Z79899 Other long term (current) drug therapy: Secondary | ICD-10-CM | POA: Diagnosis not present

## 2015-10-11 DIAGNOSIS — R51 Headache: Secondary | ICD-10-CM | POA: Diagnosis not present

## 2015-10-11 DIAGNOSIS — M4692 Unspecified inflammatory spondylopathy, cervical region: Secondary | ICD-10-CM | POA: Diagnosis not present

## 2015-10-11 DIAGNOSIS — M47812 Spondylosis without myelopathy or radiculopathy, cervical region: Secondary | ICD-10-CM | POA: Diagnosis not present

## 2015-10-11 DIAGNOSIS — G894 Chronic pain syndrome: Secondary | ICD-10-CM | POA: Diagnosis not present

## 2015-10-11 DIAGNOSIS — Z79899 Other long term (current) drug therapy: Secondary | ICD-10-CM | POA: Diagnosis not present

## 2015-10-11 DIAGNOSIS — Z79891 Long term (current) use of opiate analgesic: Secondary | ICD-10-CM | POA: Diagnosis not present

## 2015-11-08 DIAGNOSIS — M4692 Unspecified inflammatory spondylopathy, cervical region: Secondary | ICD-10-CM | POA: Diagnosis not present

## 2015-11-08 DIAGNOSIS — Z79899 Other long term (current) drug therapy: Secondary | ICD-10-CM | POA: Diagnosis not present

## 2015-11-08 DIAGNOSIS — G894 Chronic pain syndrome: Secondary | ICD-10-CM | POA: Diagnosis not present

## 2015-11-08 DIAGNOSIS — M47812 Spondylosis without myelopathy or radiculopathy, cervical region: Secondary | ICD-10-CM | POA: Diagnosis not present

## 2015-11-08 DIAGNOSIS — Z79891 Long term (current) use of opiate analgesic: Secondary | ICD-10-CM | POA: Diagnosis not present

## 2015-11-08 DIAGNOSIS — R51 Headache: Secondary | ICD-10-CM | POA: Diagnosis not present

## 2015-11-27 DIAGNOSIS — G894 Chronic pain syndrome: Secondary | ICD-10-CM | POA: Diagnosis not present

## 2015-11-27 DIAGNOSIS — R51 Headache: Secondary | ICD-10-CM | POA: Diagnosis not present

## 2015-11-27 DIAGNOSIS — Z79891 Long term (current) use of opiate analgesic: Secondary | ICD-10-CM | POA: Diagnosis not present

## 2015-11-27 DIAGNOSIS — M47812 Spondylosis without myelopathy or radiculopathy, cervical region: Secondary | ICD-10-CM | POA: Diagnosis not present

## 2015-11-27 DIAGNOSIS — Z79899 Other long term (current) drug therapy: Secondary | ICD-10-CM | POA: Diagnosis not present

## 2015-11-27 DIAGNOSIS — M4692 Unspecified inflammatory spondylopathy, cervical region: Secondary | ICD-10-CM | POA: Diagnosis not present

## 2016-01-01 DIAGNOSIS — G894 Chronic pain syndrome: Secondary | ICD-10-CM | POA: Diagnosis not present

## 2016-01-01 DIAGNOSIS — R51 Headache: Secondary | ICD-10-CM | POA: Diagnosis not present

## 2016-01-01 DIAGNOSIS — M47812 Spondylosis without myelopathy or radiculopathy, cervical region: Secondary | ICD-10-CM | POA: Diagnosis not present

## 2016-01-01 DIAGNOSIS — Z79899 Other long term (current) drug therapy: Secondary | ICD-10-CM | POA: Diagnosis not present

## 2016-01-01 DIAGNOSIS — M4692 Unspecified inflammatory spondylopathy, cervical region: Secondary | ICD-10-CM | POA: Diagnosis not present

## 2016-01-01 DIAGNOSIS — Z79891 Long term (current) use of opiate analgesic: Secondary | ICD-10-CM | POA: Diagnosis not present

## 2016-11-05 ENCOUNTER — Encounter (HOSPITAL_COMMUNITY): Payer: Self-pay | Admitting: Emergency Medicine

## 2016-11-05 ENCOUNTER — Emergency Department (HOSPITAL_COMMUNITY)
Admission: EM | Admit: 2016-11-05 | Discharge: 2016-11-06 | Disposition: A | Discharge status: Home / Self Care | Payer: Medicare Other | Attending: Emergency Medicine | Admitting: Emergency Medicine

## 2016-11-05 ENCOUNTER — Emergency Department (HOSPITAL_COMMUNITY): Payer: Medicare Other

## 2016-11-05 DIAGNOSIS — S52501A Unspecified fracture of the lower end of right radius, initial encounter for closed fracture: Secondary | ICD-10-CM | POA: Diagnosis not present

## 2016-11-05 DIAGNOSIS — Y999 Unspecified external cause status: Secondary | ICD-10-CM | POA: Diagnosis not present

## 2016-11-05 DIAGNOSIS — Z79899 Other long term (current) drug therapy: Secondary | ICD-10-CM | POA: Insufficient documentation

## 2016-11-05 DIAGNOSIS — S6991XA Unspecified injury of right wrist, hand and finger(s), initial encounter: Secondary | ICD-10-CM | POA: Diagnosis not present

## 2016-11-05 DIAGNOSIS — Y939 Activity, unspecified: Secondary | ICD-10-CM | POA: Diagnosis not present

## 2016-11-05 DIAGNOSIS — M79641 Pain in right hand: Secondary | ICD-10-CM | POA: Diagnosis not present

## 2016-11-05 DIAGNOSIS — F17210 Nicotine dependence, cigarettes, uncomplicated: Secondary | ICD-10-CM | POA: Diagnosis not present

## 2016-11-05 DIAGNOSIS — W2209XA Striking against other stationary object, initial encounter: Secondary | ICD-10-CM | POA: Insufficient documentation

## 2016-11-05 DIAGNOSIS — Y929 Unspecified place or not applicable: Secondary | ICD-10-CM | POA: Diagnosis not present

## 2016-11-05 DIAGNOSIS — M25531 Pain in right wrist: Secondary | ICD-10-CM | POA: Diagnosis not present

## 2016-11-05 NOTE — ED Notes (Signed)
Bed: WTR8 Expected date:  Expected time:  Means of arrival:  Comments: 

## 2016-11-05 NOTE — ED Triage Notes (Signed)
Pt states about 2 weeks ago he got upset and punched a door, a wall, and then a washing machine  Pt states his right hand had swelling and bruising and pain  Pt states the swelling has gone down but it continues to hurt just as bad as the day he did it

## 2016-11-06 ENCOUNTER — Emergency Department (HOSPITAL_COMMUNITY): Payer: Medicare Other

## 2016-11-06 DIAGNOSIS — M25531 Pain in right wrist: Secondary | ICD-10-CM | POA: Diagnosis not present

## 2016-11-06 MED ORDER — IBUPROFEN 800 MG PO TABS
800.0000 mg | ORAL_TABLET | Freq: Once | ORAL | Status: AC
  Administered 2016-11-06: 800 mg via ORAL
  Filled 2016-11-06: qty 1

## 2016-11-06 NOTE — Discharge Instructions (Signed)
You may alternate Tylenol 1000 mg every 6 hours as needed for pain and Ibuprofen 800 mg every 8 hours as needed for pain.  Please take Ibuprofen with food. ° °

## 2016-11-06 NOTE — ED Provider Notes (Signed)
TIME SEEN: 12:01 AM  CHIEF COMPLAINT: Right wrist pain  HPI: Patient is a 41 year old right-hand dominant male with history of fibromyalgia, anxiety, substance abuse who presents to the emergency department with right wrist pain. He states 2 weeks ago he punched a wall and developed wrist pain. He has not seen a doctor for this yet. States he has tried ibuprofen without relief. No other injury. Feels like he cannot fully extend his fingers secondary to pain.  ROS: See HPI Constitutional: no fever  Eyes: no drainage  ENT: no runny nose   Cardiovascular:  no chest pain  Resp: no SOB  GI: no vomiting GU: no dysuria Integumentary: no rash  Allergy: no hives  Musculoskeletal: no leg swelling  Neurological: no slurred speech ROS otherwise negative  PAST MEDICAL HISTORY/PAST SURGICAL HISTORY:  Past Medical History:  Diagnosis Date  . Anxiety   . Arthritis   . Cervical stenosis of spinal canal   . Depression   . Fibromyalgia   . Headache   . Hernia    Bilateral Inguinal  . Pancreatitis   . Syncope    April 2016    MEDICATIONS:  Prior to Admission medications   Medication Sig Start Date End Date Taking? Authorizing Provider  ALPRAZolam Prudy Feeler) 1 MG tablet Take 1 mg by mouth 4 (four) times daily.     [provider]  cloNIDine (CATAPRES) 0.1 MG tablet Take 1 tablet (0.1 mg total) by mouth QID. Patient not taking: Reported on 09/01/2015 08/20/15   Renne Crigler, PA-C  hydrOXYzine (ATARAX/VISTARIL) 25 MG tablet Take 1 tablet (25 mg total) by mouth every 6 (six) hours as needed for anxiety or itching. Patient not taking: Reported on 09/01/2015 08/20/15   Renne Crigler, PA-C  ibuprofen (ADVIL,MOTRIN) 200 MG tablet Take 400-800 mg by mouth 2 (two) times daily as needed for moderate pain. Reported on 09/01/2015    [provider]  loperamide (IMODIUM) 2 MG capsule Take 1 capsule (2 mg total) by mouth 4 (four) times daily as needed for diarrhea or loose stools. Patient not  taking: Reported on 09/01/2015 08/20/15   Renne Crigler, PA-C  methocarbamol (ROBAXIN) 500 MG tablet Take 1 tablet (500 mg total) by mouth every 6 (six) hours as needed for muscle spasms. Patient not taking: Reported on 09/01/2015 08/20/15   Renne Crigler, PA-C  ondansetron (ZOFRAN ODT) 4 MG disintegrating tablet Take 1 tablet (4 mg total) by mouth every 8 (eight) hours as needed for nausea or vomiting. Patient not taking: Reported on 09/01/2015 08/20/15   Renne Crigler, PA-C  oxyCODONE-acetaminophen (PERCOCET) 10-325 MG tablet Take 1 tablet by mouth every 6 (six) hours. Reported on 09/01/2015    [provider]    ALLERGIES:  Allergies  Allergen Reactions  . Iohexol Other (See Comments)     Desc: PER MD/PER PATIENT NOT ALLERGIC 06/11/05 RM/PER RADIOLOGIST GIVE PREMEDICATION 06/12/05     SOCIAL HISTORY:  Social History  Substance Use Topics  . Smoking status: Current Every Day Smoker    Packs/day: 3.50    Years: 22.00    Types: Cigarettes    Last attempt to quit: 10/01/2014  . Smokeless tobacco: Never Used     Comment: uses a nicotine patch  . Alcohol use No    FAMILY HISTORY: Family History  Problem Relation Age of Onset  . Hypertension Mother   . Heart disease Father   . Crohn's disease Maternal Aunt   . Colon cancer Paternal Grandfather     EXAM:  BP (!) 138/94 (BP Location: Left Arm)   Pulse 64   Temp 98.1 F (36.7 C) (Oral)   Resp 18   SpO2 98%  CONSTITUTIONAL: Alert and oriented and responds appropriately to questions. Well-appearing; well-nourished HEAD: Normocephalic EYES: Conjunctivae clear, pupils appear equal, EOMI ENT: normal nose; moist mucous membranes NECK: Supple, no meningismus, no nuchal rigidity, no LAD  CARD: RRR; S1 and S2 appreciated; no murmurs, no clicks, no rubs, no gallops RESP: Normal chest excursion without splinting or tachypnea; breath sounds clear and equal bilaterally; no wheezes, no rhonchi, no rales, no hypoxia or respiratory  distress, speaking full sentences ABD/GI: Normal bowel sounds; non-distended; soft, non-tender, no rebound, no guarding, no peritoneal signs, no hepatosplenomegaly BACK:  The back appears normal and is non-tender to palpation, there is no CVA tenderness EXT: Patient is tender to palpation diffusely over the right dorsal hand and right wrist with difficulty fully extending the right fingers but has full flexion. Normal sensation throughout the right hand. Decreased flexion and extension in the right wrist secondary to pain. No tenderness over the right proximal forearm, elbow, humerus or shoulder. Otherwise Normal ROM in all joints; otherwise extremities are non-tender to palpation; no edema; normal capillary refill; no cyanosis, no calf tenderness or swelling; 2+ radial pulses bilaterally    SKIN: Normal color for age and race; warm; no rash NEURO: Moves all extremities equally PSYCH: The patient's mood and manner are appropriate. Grooming and personal hygiene are appropriate.  MEDICAL DECISION MAKING: Patient here with right wrist injury after punching a wall 2 weeks ago. Hand x-ray shows no acute abnormality. X-ray of the right wrist suggests a possible cortex fracture of the distal radius over the ulnar aspect. We will place him in a sugar tong splint and have him follow up with hand surgery as an outpatient. He is requesting narcotics for pain control. I discussed with him that I do not full comfortable with this given his history of heroin abuse. Also discussed with patient given injury is 2 weeks out and it is not displaced and appears to be healing but I feel Tylenol and Motrin are appropriate for pain control. He verbalized understanding and is comfortable with this plan.   At this time, I do not feel there is any life-threatening condition present. I have reviewed and discussed all results (EKG, imaging, lab, urine as appropriate) and exam findings with patient/family. I have reviewed nursing  notes and appropriate previous records.  I feel the patient is safe to be discharged home without further emergent workup and can continue workup as an outpatient as needed. Discussed usual and customary return precautions. Patient/family verbalize understanding and are comfortable with this plan.  Outpatient follow-up has been provided if needed. All questions have been answered.   SPLINT APPLICATION Date: 11/06/16 Authorized by: Raelyn Number Consent: Verbal consent obtained. Risks and benefits: risks, benefits and alternatives were discussed Consent given by: patient Splint applied by: orthopedic technician Location details: right wrist Splint type: sugar tong Supplies used: fiberglass Post-procedure: The splinted body part was neurovascularly unchanged following the procedure. Patient tolerance: Patient tolerated the procedure well with no immediate complications.        Budney, Layla Maw, DO 11/06/16 [phone]

## 2016-11-06 NOTE — ED Notes (Signed)
Ortho tech from [facility] paged for splint

## 2016-12-31 ENCOUNTER — Ambulatory Visit (INDEPENDENT_AMBULATORY_CARE_PROVIDER_SITE_OTHER): Payer: Medicare Other

## 2016-12-31 ENCOUNTER — Other Ambulatory Visit: Payer: Self-pay

## 2016-12-31 ENCOUNTER — Encounter (HOSPITAL_COMMUNITY): Payer: Self-pay | Admitting: Emergency Medicine

## 2016-12-31 ENCOUNTER — Ambulatory Visit (INDEPENDENT_AMBULATORY_CARE_PROVIDER_SITE_OTHER)
Admission: EM | Admit: 2016-12-31 | Discharge: 2016-12-31 | Disposition: A | Discharge status: Home / Self Care | Payer: Medicare Other | Source: Home / Self Care | Attending: Emergency Medicine | Admitting: Emergency Medicine

## 2016-12-31 DIAGNOSIS — R001 Bradycardia, unspecified: Secondary | ICD-10-CM | POA: Insufficient documentation

## 2016-12-31 DIAGNOSIS — R202 Paresthesia of skin: Secondary | ICD-10-CM

## 2016-12-31 DIAGNOSIS — F329 Major depressive disorder, single episode, unspecified: Secondary | ICD-10-CM

## 2016-12-31 DIAGNOSIS — R079 Chest pain, unspecified: Secondary | ICD-10-CM | POA: Diagnosis not present

## 2016-12-31 DIAGNOSIS — F419 Anxiety disorder, unspecified: Secondary | ICD-10-CM

## 2016-12-31 DIAGNOSIS — Z7982 Long term (current) use of aspirin: Secondary | ICD-10-CM

## 2016-12-31 DIAGNOSIS — R109 Unspecified abdominal pain: Secondary | ICD-10-CM | POA: Diagnosis not present

## 2016-12-31 DIAGNOSIS — M797 Fibromyalgia: Secondary | ICD-10-CM

## 2016-12-31 DIAGNOSIS — R200 Anesthesia of skin: Secondary | ICD-10-CM | POA: Insufficient documentation

## 2016-12-31 DIAGNOSIS — R072 Precordial pain: Secondary | ICD-10-CM | POA: Insufficient documentation

## 2016-12-31 DIAGNOSIS — K859 Acute pancreatitis without necrosis or infection, unspecified: Secondary | ICD-10-CM

## 2016-12-31 DIAGNOSIS — R0602 Shortness of breath: Secondary | ICD-10-CM

## 2016-12-31 DIAGNOSIS — F17210 Nicotine dependence, cigarettes, uncomplicated: Secondary | ICD-10-CM

## 2016-12-31 DIAGNOSIS — I4510 Unspecified right bundle-branch block: Secondary | ICD-10-CM

## 2016-12-31 DIAGNOSIS — K209 Esophagitis, unspecified: Secondary | ICD-10-CM | POA: Insufficient documentation

## 2016-12-31 LAB — CBC
HCT: 44.5 % (ref 39.0–52.0)
HEMOGLOBIN: 14.9 g/dL (ref 13.0–17.0)
MCH: 31.5 pg (ref 26.0–34.0)
MCHC: 33.5 g/dL (ref 30.0–36.0)
MCV: 94.1 fL (ref 78.0–100.0)
PLATELETS: 242 10*3/uL (ref 150–400)
RBC: 4.73 MIL/uL (ref 4.22–5.81)
RDW: 13.4 % (ref 11.5–15.5)
WBC: 7.5 10*3/uL (ref 4.0–10.5)

## 2016-12-31 LAB — BASIC METABOLIC PANEL
Anion gap: 8 (ref 5–15)
BUN: 6 mg/dL (ref 6–20)
CALCIUM: 9 mg/dL (ref 8.9–10.3)
CHLORIDE: 105 mmol/L (ref 101–111)
CO2: 27 mmol/L (ref 22–32)
CREATININE: 0.79 mg/dL (ref 0.61–1.24)
GFR calc Af Amer: >60 mL/min (ref >60)
GFR calc non Af Amer: >60 mL/min (ref >60)
GLUCOSE: 102 mg/dL — AB (ref 65–99)
Potassium: 4 mmol/L (ref 3.5–5.1)
Sodium: 140 mmol/L (ref 135–145)

## 2016-12-31 LAB — I-STAT TROPONIN, ED: TROPONIN I, POC: 0 ng/mL (ref 0.00–0.08)

## 2016-12-31 MED ORDER — ASPIRIN 81 MG PO CHEW
CHEWABLE_TABLET | ORAL | Status: AC
  Filled 2016-12-31: qty 1

## 2016-12-31 MED ORDER — ASPIRIN 81 MG PO CHEW
324.0000 mg | CHEWABLE_TABLET | Freq: Once | ORAL | Status: AC
  Administered 2016-12-31: 324 mg via ORAL

## 2016-12-31 NOTE — ED Provider Notes (Signed)
HPI  SUBJECTIVE:  Brent BullocksHarold E Commerford Jr. is a 41 y.o. male who presents with constant, substernal chest pain described as pressure that radiates through to his back for the past 3 days. Today he reports numbness and tingling going down his left arm. Reports nausea secondary to pain. Reports wheezing at night, shortness of breath. He states the chest pain gets worse with exertion and also with lying down. It is not associated with arm movement, torso rotation. He reports bilateral lower extremity edema over the past 3 days as well. No coughing, hemoptysis, fevers. No unintentional weight loss, night sweats. No calf pain. Is a heavy smoker, has been smoking for the past 23 years, 3 packs per day for the past year, has a past medical history of syncope, palpitations, degenerative disc disease in his C-spine, T-spine, cervical spinal stenosis. No history of MI, hypertension, diabetes, atrial fibrillation, asthma, emphysema, COPD, hypercholesterolemia, coronary artery disease, CHF. Family history significant for father with MIs starting in his mid 4750s. Denies alcohol or illicit drug use. PMD: None.   Past Medical History:  Diagnosis Date  . Anxiety   . Arthritis   . Cervical stenosis of spinal canal   . Depression   . Fibromyalgia   . Headache   . Hernia    Bilateral Inguinal  . Pancreatitis   . Syncope    April 2016    Past Surgical History:  Procedure Laterality Date  . INGUINAL HERNIA REPAIR    . MULTIPLE EXTRACTIONS WITH ALVEOLOPLASTY N/A 10/13/2014   Procedure: Extraction of tooth #'s 2,4,5,6,7,8,9,10,11,12,13,14,15, 16,10,96,04,54,09,81,19,14,7819,20,21,22,23,24,25,26,27,28 with alveoloplasty and bilateral mandibular tori reductions;  Surgeon: Charlynne Panderonald F Kulinski, DDS;  [facility];  Service: Oral Surgery;  Laterality: N/A;  NASAL TUBE  . thumb surgery      Family History  Problem Relation Age of Onset  . Hypertension Mother   . Heart disease Father   . Crohn's disease Maternal Aunt   . Colon cancer Paternal  Grandfather     Social History  Substance Use Topics  . Smoking status: Current Every Day Smoker    Packs/day: 3.50    Years: 22.00    Types: Cigarettes    Last attempt to quit: 10/01/2014  . Smokeless tobacco: Never Used     Comment: uses a nicotine patch  . Alcohol use No     Current Facility-Administered Medications:  .  aspirin chewable tablet 324 mg, 324 mg, Oral, Once, Domenick GongMortenson, Draysen Weygandt, MD  Current Outpatient Prescriptions:  .  ALPRAZolam (XANAX) 1 MG tablet, Take 1 mg by mouth 4 (four) times daily. , Disp: , Rfl:  .  ibuprofen (ADVIL,MOTRIN) 200 MG tablet, Take 400-800 mg by mouth 2 (two) times daily as needed for moderate pain. Reported on 09/01/2015, Disp: , Rfl:   Allergies  Allergen Reactions  . Iohexol Other (See Comments)     Desc: PER MD/PER PATIENT NOT ALLERGIC 06/11/05 RM/PER RADIOLOGIST GIVE PREMEDICATION 06/12/05      ROS  As noted in HPI.   Physical Exam  BP 126/76 (BP Location: Right Arm)   Pulse 61   Temp 98.2 F (36.8 C) (Oral)   Resp 18   SpO2 99%   Constitutional: Well developed, well nourished, no acute distress Eyes: PERRL, EOMI, conjunctiva normal bilaterally HENT: Normocephalic, atraumatic,mucus membranes moist Respiratory: Fair air movement, no rales, no rhonchi. Positive diffuse chest wall tenderness but patient states this does not reproduce the pain that he presents with. Faint wheezing left side. Cardiovascular: Normal rate and  rhythm, no murmurs, no gallops, no rubs.  GI: Soft, nondistended, normal bowel sounds, nontender, no rebound, no guarding. no pulsatile masses. skin: No rash, skin intact Musculoskeletal: Calves symmetric. No edema, no tenderness, no deformities Neurologic: Alert & oriented x 3, CN II-XII grossly intact, no motor deficits, sensation grossly intact Psychiatric: Speech and behavior appropriate   ED Course   Medications  aspirin chewable tablet 324 mg (not administered)    Orders Placed This Encounter   Procedures  . DG Chest 2 View    Standing Status:   Standing    Number of Occurrences:   1    Order Specific Question:   Reason for Exam (SYMPTOM  OR DIAGNOSIS REQUIRED)    Answer:   cp  . ED EKG    Standing Status:   Standing    Number of Occurrences:   1    Order Specific Question:   Reason for Exam    Answer:   Chest Pain   No results found for this or any previous visit (from the past 24 hour(s)). Dg Chest 2 View  Result Date: 12/31/2016 CLINICAL DATA:  Chest pain radiating into the left arm. Shortness of breath EXAM: CHEST  2 VIEW COMPARISON:  Single-view of the chest 06/16/2015. FINDINGS: Lungs are clear. Heart size is normal. No pneumothorax or pleural fluid. No acute bony abnormality. IMPRESSION: No acute disease. Electronically Signed   By: Drusilla Kanner M.D.   On: 12/31/2016 19:53    ED Clinical Impression  Chest pain, unspecified type   ED Assessment/Plan  Independently reviewed Chest x-ray: Normal. See radiology report for details.  EKG: sinus bradycardia rate 53, normal axis, right bundle branch block. No hypertrophy. No ST-T wave changes. No changes compared to EKG from 2016.   Presentation concerning for cardiac cause of pain. He does have chest wall tenderness however does not reproduce the pain that he presents with. He does have a normal chest x-ray and no ischemic changes on EKG. He is a heavy smoker and has a significant family history, so gave patient 324 mg aspirin and am transferring to the ED for comprehensive workup. Given that he has had the pain for 3 days and he has no EKG changes, feel that he is stable to go by private vehicle. States that his mother can taken to the department.  Discussed MDM, plan and followup with patient. . Patient agrees with plan.   Meds ordered this encounter  Medications  . aspirin chewable tablet 324 mg    *This clinic note was created using Scientist, clinical (histocompatibility and immunogenetics). Therefore, there may be occasional mistakes  despite careful proofreading.  ?   Domenick Gong, MD 12/31/16 2044

## 2016-12-31 NOTE — Discharge Instructions (Signed)
Left them know if your chest pain changes or gets worse. We are giving you a dose of aspirin here.

## 2016-12-31 NOTE — ED Triage Notes (Signed)
Pt sts generalized CP worse with inspiration and movement x 3 days

## 2016-12-31 NOTE — ED Triage Notes (Signed)
Patient arrives complaining of central chest pain with radiation "straight through to my back". Onset 3 days ago with gradual progression each day. Today it woke patient from sleep. [facility] advised to come here.

## 2017-01-01 ENCOUNTER — Emergency Department (HOSPITAL_COMMUNITY)
Admission: EM | Admit: 2017-01-01 | Discharge: 2017-01-01 | Disposition: A | Discharge status: Home / Self Care | Payer: Medicare Other | Attending: Emergency Medicine | Admitting: Emergency Medicine

## 2017-01-01 ENCOUNTER — Emergency Department (HOSPITAL_COMMUNITY): Payer: Medicare Other

## 2017-01-01 DIAGNOSIS — K209 Esophagitis, unspecified without bleeding: Secondary | ICD-10-CM

## 2017-01-01 DIAGNOSIS — R109 Unspecified abdominal pain: Secondary | ICD-10-CM | POA: Diagnosis not present

## 2017-01-01 DIAGNOSIS — R079 Chest pain, unspecified: Secondary | ICD-10-CM | POA: Diagnosis not present

## 2017-01-01 LAB — I-STAT TROPONIN, ED
TROPONIN I, POC: 0 ng/mL (ref 0.00–0.08)
Troponin i, poc: 0 ng/mL (ref 0.00–0.08)

## 2017-01-01 MED ORDER — DIPHENHYDRAMINE HCL 25 MG PO CAPS: 50.0000 mg | ORAL_CAPSULE | Freq: Once | ORAL | Status: AC

## 2017-01-01 MED ORDER — MORPHINE SULFATE (PF) 4 MG/ML IV SOLN
4.0000 mg | Freq: Once | INTRAVENOUS | Status: AC
  Administered 2017-01-01: 4 mg via INTRAVENOUS
  Filled 2017-01-01: qty 1

## 2017-01-01 MED ORDER — SUCRALFATE 1 GM/10ML PO SUSP: 1.0000 g | Freq: Three times a day (TID) | ORAL | 0 refills | Status: DC

## 2017-01-01 MED ORDER — ONDANSETRON HCL 4 MG/2ML IJ SOLN
4.0000 mg | Freq: Once | INTRAMUSCULAR | Status: AC
  Administered 2017-01-01: 4 mg via INTRAVENOUS
  Filled 2017-01-01: qty 2

## 2017-01-01 MED ORDER — IOPAMIDOL (ISOVUE-370) INJECTION 76%
INTRAVENOUS | Status: AC
  Administered 2017-01-01: 100 mL
  Filled 2017-01-01: qty 100

## 2017-01-01 MED ORDER — RANITIDINE HCL 150 MG PO TABS: 150.0000 mg | ORAL_TABLET | Freq: Two times a day (BID) | ORAL | 0 refills | Status: DC

## 2017-01-01 MED ORDER — HYDROCORTISONE NA SUCCINATE PF 250 MG IJ SOLR
200.0000 mg | Freq: Once | INTRAMUSCULAR | Status: AC
  Administered 2017-01-01: 200 mg via INTRAVENOUS
  Filled 2017-01-01: qty 200

## 2017-01-01 MED ORDER — DIPHENHYDRAMINE HCL 50 MG/ML IJ SOLN
50.0000 mg | Freq: Once | INTRAMUSCULAR | Status: AC
  Administered 2017-01-01: 50 mg via INTRAVENOUS
  Filled 2017-01-01: qty 1

## 2017-01-01 NOTE — ED Notes (Signed)
PT states understanding of care given, follow up care, and medication prescribed. PT ambulated from ED to car with a steady gait. 

## 2017-01-01 NOTE — ED Provider Notes (Signed)
[facility]   CSN: [id number] Arrival date & time: 12/31/16  2059     History   Chief Complaint Chief Complaint  Patient presents with  . Chest Pain  . Back Pain    HPI Carey BullocksHarold E Ducharme Jr. is a 41 y.o. male.  Patient referred to emergency department from urgent care for further evaluation of chest pain.  Patient reports constant pain in the center of his chest that radiates through into his back for 3 days.  He has not identified any alleviating or exacerbating factors.  Not related to movements or positioning.  He has not identified an exertional component.  He reports that it woke him from sleep today.  He is not short of breath.  He is a smoker and his father had an MI in his 7450s, no history of hypertension, hypercholesterolemia, diabetes, obesity.      Past Medical History:  Diagnosis Date  . Anxiety   . Arthritis   . Cervical stenosis of spinal canal   . Depression   . Fibromyalgia   . Headache   . Hernia    Bilateral Inguinal  . Pancreatitis   . Syncope    April 2016    Patient Active Problem List   Diagnosis Date Noted  . Mood disorder (HCC) 03/01/2014  . Suicidal ideation   . Pancreatitis, acute 03/01/2013  . Abdominal pain 03/01/2013  . Tobacco abuse disorder 08/29/2011  . PERS HX NONCOMPLIANCE W/MED TX PRS HAZARDS HLTH 05/08/2009  . TOBACCO USE 03/13/2009  . LYME DISEASE, HX OF 11/02/2008  . ABDOMINAL PAIN OTHER SPECIFIED SITE 10/11/2008  . SPINAL STENOSIS, CERVICAL 05/02/2008  . ANXIETY 11/19/2007  . History of drug dependence (HCC) 04/10/2007  . Depression 08/12/2006    Past Surgical History:  Procedure Laterality Date  . INGUINAL HERNIA REPAIR    . MULTIPLE EXTRACTIONS WITH ALVEOLOPLASTY N/A 10/13/2014   Procedure: Extraction of tooth #'s 2,4,5,6,7,8,9,10,11,12,13,14,15, 40,98,11,91,47,82,95,62,13,0819,20,21,22,23,24,25,26,27,28 with alveoloplasty and bilateral mandibular tori reductions;  Surgeon: Charlynne Panderonald F Kulinski, DDS;   [facility];  Service: Oral Surgery;  Laterality: N/A;  NASAL TUBE  . thumb surgery         Home Medications    Prior to Admission medications   Medication Sig Start Date End Date Taking? Authorizing Provider  ibuprofen (ADVIL,MOTRIN) 200 MG tablet Take 400-800 mg by mouth 2 (two) times daily as needed for moderate pain. Reported on 09/01/2015   Yes [provider]  ranitidine (ZANTAC) 150 MG tablet Take 1 tablet (150 mg total) by mouth 2 (two) times daily. 01/01/17   Gilda CreasePollina, Hyatt Capobianco J, MD  sucralfate (CARAFATE) 1 GM/10ML suspension Take 10 mLs (1 g total) by mouth 4 (four) times daily -  with meals and at bedtime. 01/01/17   Gilda CreasePollina, Jasmin Trumbull J, MD    Family History Family History  Problem Relation Age of Onset  . Hypertension Mother   . Heart disease Father   . Crohn's disease Maternal Aunt   . Colon cancer Paternal Grandfather     Social History Social History  Substance Use Topics  . Smoking status: Current Every Day Smoker    Packs/day: 3.50    Years: 22.00    Types: Cigarettes    Last attempt to quit: 10/01/2014  . Smokeless tobacco: Never Used     Comment: uses a nicotine patch  . Alcohol use No     Allergies   Iohexol   Review of Systems Review of Systems  Cardiovascular: Positive for chest pain.  All other systems reviewed and are negative.    Physical Exam Updated Vital Signs BP 114/73   Pulse (!) 57   Temp 98.4 F (36.9 C) (Oral)   Resp 18   SpO2 100%   Physical Exam  Constitutional: He is oriented to person, place, and time. He appears well-developed and well-nourished. No distress.  HENT:  Head: Normocephalic and atraumatic.  Right Ear: Hearing normal.  Left Ear: Hearing normal.  Nose: Nose normal.  Mouth/Throat: Oropharynx is clear and moist and mucous membranes are normal.  Eyes: Pupils are equal, round, and reactive to light. Conjunctivae and EOM are normal.  Neck: Normal range of motion. Neck supple.    Cardiovascular: Regular rhythm, S1 normal and S2 normal.  Exam reveals no gallop and no friction rub.   No murmur heard. Pulmonary/Chest: Effort normal and breath sounds normal. No respiratory distress. He exhibits no tenderness.  Abdominal: Soft. Normal appearance and bowel sounds are normal. There is no hepatosplenomegaly. There is no tenderness. There is no rebound, no guarding, no tenderness at McBurney's point and negative Murphy's sign. No hernia.  Musculoskeletal: Normal range of motion.  Neurological: He is alert and oriented to person, place, and time. He has normal strength. No cranial nerve deficit or sensory deficit. Coordination normal. GCS eye subscore is 4. GCS verbal subscore is 5. GCS motor subscore is 6.  Skin: Skin is warm, dry and intact. No rash noted. No cyanosis.  Psychiatric: He has a normal mood and affect. His speech is normal and behavior is normal. Thought content normal.  Nursing note and vitals reviewed.    ED Treatments / Results  Labs (all labs ordered are listed, but only abnormal results are displayed) Labs Reviewed  BASIC METABOLIC PANEL - Abnormal; Notable for the following:       Result Value   Glucose, Bld 102 (*)    All other components within normal limits  CBC  I-STAT TROPONIN, ED  I-STAT TROPONIN, ED  I-STAT TROPONIN, ED    EKG  EKG Interpretation  Date/Time:  Tuesday December 31 2016 21:01:15 EDT Ventricular Rate:  58 PR Interval:  148 QRS Duration: 114 QT Interval:  432 QTC Calculation: 424 R Axis:   43 Text Interpretation:  Sinus bradycardia Incomplete right bundle branch block Borderline ECG No significant change since last tracing Confirmed by Gilda Crease [phone]) on 01/01/2017 12:50:11 AM       Radiology Dg Chest 2 View  Result Date: 12/31/2016 CLINICAL DATA:  Chest pain radiating into the left arm. Shortness of breath EXAM: CHEST  2 VIEW COMPARISON:  Single-view of the chest 06/16/2015. FINDINGS: Lungs are clear.  Heart size is normal. No pneumothorax or pleural fluid. No acute bony abnormality. IMPRESSION: No acute disease. Electronically Signed   By: Drusilla Kanner M.D.   On: 12/31/2016 19:53   Ct Angio Chest/abd/pel For Dissection W &/or Wo Contrast  Result Date: 01/01/2017 CLINICAL DATA:  Acute onset of generalized chest pain. Assess for aortic dissection. Initial encounter. EXAM: CT ANGIOGRAPHY CHEST, ABDOMEN AND PELVIS TECHNIQUE: Multidetector CT imaging through the chest, abdomen and pelvis was performed using the standard protocol during bolus administration of intravenous contrast. Multiplanar reconstructed images and MIPs were obtained and reviewed to evaluate the vascular anatomy. CONTRAST:  100 mL of Isovue 370 IV contrast COMPARISON:  Chest radiograph performed 12/31/2016, and CT of the abdomen and pelvis performed 04/03/2015 FINDINGS: CTA CHEST FINDINGS Cardiovascular: There is no evidence of aortic  dissection. There is no evidence of aneurysmal dilatation. No calcific atherosclerotic disease is seen. The great vessels are grossly unremarkable. There is no evidence of significant pulmonary embolus. The heart is normal in size. Mediastinum/Nodes: Mild wall thickening is suggested along the mid to distal esophagus, raising concern for esophagitis. Would correlate for any associated symptoms. No mediastinal lymphadenopathy is seen. No pericardial effusion is identified. The visualized portions of the thyroid gland are unremarkable. No axillary lymphadenopathy is appreciated. Lungs/Pleura: Minimal bibasilar atelectasis is noted. Minimal blebs are noted at the right lung apex. No pleural effusion or pneumothorax is seen. No masses are identified. Musculoskeletal: No acute osseous abnormalities are identified. The visualized musculature is unremarkable in appearance. Review of the MIP images confirms the above findings. CTA ABDOMEN AND PELVIS FINDINGS VASCULAR Aorta: There is no evidence of aortic dissection.  There is no evidence of aneurysmal dilatation. No calcific atherosclerotic disease is seen. Celiac: The celiac trunk is fully patent. SMA: The superior mesenteric artery is unremarkable in appearance. Renals: The renal arteries are patent bilaterally. 2 renal arteries are seen on each side. IMA: The inferior mesenteric artery remains patent. Inflow: The common, internal external iliac arteries appear intact bilaterally. The common femoral arteries and their branches appear grossly intact. Veins: Visualized venous structures are unremarkable in appearance. The inferior vena cava is unremarkable. Review of the MIP images confirms the above findings. NON-VASCULAR Hepatobiliary: The liver is unremarkable in appearance. The gallbladder is unremarkable in appearance. The common bile duct remains normal in caliber. Pancreas: The pancreas is within normal limits. Spleen: The spleen is unremarkable in appearance. Adrenals/Urinary Tract: The adrenal glands are unremarkable in appearance. The kidneys are within normal limits. There is no evidence of hydronephrosis. No renal or ureteral stones are identified. No perinephric stranding is seen. Stomach/Bowel: The stomach is unremarkable in appearance. The small bowel is within normal limits. The appendix is diminutive and normal in caliber, without evidence of appendicitis. The colon is unremarkable in appearance. Lymphatic: No retroperitoneal or pelvic sidewall lymphadenopathy is seen. Reproductive: The bladder is mildly distended and grossly unremarkable. The prostate remains normal in size. Other: No additional soft tissue abnormalities are seen. Musculoskeletal: No acute osseous abnormalities are identified. The visualized musculature is unremarkable in appearance. Review of the MIP images confirms the above findings. IMPRESSION: 1. No evidence of aortic dissection. No evidence of aneurysmal dilatation. No calcific atherosclerotic disease seen. 2. No evidence of significant  pulmonary embolus. 3. Mild wall thickening suggested along the mid to distal esophagus, raising concern for esophagitis. Would correlate for any associated symptoms. 4. Minimal bibasilar atelectasis. Minimal blebs at the right lung apex. Electronically Signed   By: Roanna Raider M.D.   On: 01/01/2017 06:00    Procedures Procedures (including critical care time)  Medications Ordered in ED Medications  hydrocortisone sodium succinate (SOLU-CORTEF) injection 200 mg (200 mg Intravenous Given 01/01/17 0116)  diphenhydrAMINE (BENADRYL) capsule 50 mg ( Oral See Alternative 01/01/17 0359)    Or  diphenhydrAMINE (BENADRYL) injection 50 mg (50 mg Intravenous Given 01/01/17 0359)  morphine 4 MG/ML injection 4 mg (4 mg Intravenous Given 01/01/17 0215)  ondansetron (ZOFRAN) injection 4 mg (4 mg Intravenous Given 01/01/17 0215)  iopamidol (ISOVUE-370) 76 % injection (100 mLs  Contrast Given 01/01/17 0509)  morphine 4 MG/ML injection 4 mg (4 mg Intravenous Given 01/01/17 0616)  ondansetron (ZOFRAN) injection 4 mg (4 mg Intravenous Given 01/01/17 0616)     Initial Impression / Assessment and Plan / ED Course  I  have reviewed the triage vital signs and the nursing notes.  Pertinent labs & imaging results that were available during my care of the patient were reviewed by me and considered in my medical decision making (see chart for details).     Patient presents to the emergency department for evaluation of chest pain.  Patient reports continuous pain for 3 days.  He was seen in urgent care and referred to the emergency department for further evaluation.  Pain is atypical for cardiac etiology, although he does have a significant family history and he is a smoker (HEART score = 2) he has had 3- troponins here in the ER and his EKG is not suspicious.  This is felt to be adequate rule out for cardiac etiology.  As the patient did have continuous pain in the center of his chest radiating into his back, aortic  dissection was considered.  CT angiography was performed.  He needed to be premedicated prior to the study because of a history of possible iohexol allergy.  He tolerated the procedure without difficulty.  The only abnormality seen was possible esophagitis which might explain his symptoms.  We will treat for esophagitis, follow-up with his primary doctor.  Final Clinical Impressions(s) / ED Diagnoses   Final diagnoses:  Chest pain, unspecified type  Esophagitis    New Prescriptions New Prescriptions   RANITIDINE (ZANTAC) 150 MG TABLET    Take 1 tablet (150 mg total) by mouth 2 (two) times daily.   SUCRALFATE (CARAFATE) 1 GM/10ML SUSPENSION    Take 10 mLs (1 g total) by mouth 4 (four) times daily -  with meals and at bedtime.     Gilda Crease, MD 01/01/17 [phone]

## 2017-01-24 ENCOUNTER — Other Ambulatory Visit: Payer: Self-pay

## 2017-01-24 ENCOUNTER — Encounter (HOSPITAL_COMMUNITY): Payer: Self-pay

## 2017-01-24 DIAGNOSIS — R1030 Lower abdominal pain, unspecified: Secondary | ICD-10-CM | POA: Diagnosis not present

## 2017-01-24 DIAGNOSIS — Z79899 Other long term (current) drug therapy: Secondary | ICD-10-CM | POA: Insufficient documentation

## 2017-01-24 DIAGNOSIS — N50812 Left testicular pain: Secondary | ICD-10-CM | POA: Diagnosis not present

## 2017-01-24 DIAGNOSIS — F17210 Nicotine dependence, cigarettes, uncomplicated: Secondary | ICD-10-CM | POA: Diagnosis not present

## 2017-01-24 DIAGNOSIS — N50819 Testicular pain, unspecified: Secondary | ICD-10-CM | POA: Diagnosis not present

## 2017-01-24 DIAGNOSIS — N5082 Scrotal pain: Secondary | ICD-10-CM | POA: Diagnosis not present

## 2017-01-24 DIAGNOSIS — N50811 Right testicular pain: Secondary | ICD-10-CM | POA: Insufficient documentation

## 2017-01-24 NOTE — ED Triage Notes (Signed)
Pt states that he was working on his car two days ago and began to have groin pain. Hx of inguinal hernia with repair. Has also been vomiting for the past two days.

## 2017-01-25 ENCOUNTER — Emergency Department (HOSPITAL_COMMUNITY): Payer: Medicare Other

## 2017-01-25 ENCOUNTER — Emergency Department (HOSPITAL_COMMUNITY)
Admission: EM | Admit: 2017-01-25 | Discharge: 2017-01-25 | Disposition: A | Discharge status: Home / Self Care | Payer: Medicare Other | Attending: Emergency Medicine | Admitting: Emergency Medicine

## 2017-01-25 DIAGNOSIS — N50819 Testicular pain, unspecified: Secondary | ICD-10-CM

## 2017-01-25 DIAGNOSIS — R1030 Lower abdominal pain, unspecified: Secondary | ICD-10-CM | POA: Diagnosis not present

## 2017-01-25 LAB — URINALYSIS, ROUTINE W REFLEX MICROSCOPIC
BILIRUBIN URINE: NEGATIVE
GLUCOSE, UA: NEGATIVE mg/dL
Hgb urine dipstick: NEGATIVE
KETONES UR: NEGATIVE mg/dL
LEUKOCYTES UA: NEGATIVE
Nitrite: NEGATIVE
PROTEIN: NEGATIVE mg/dL
Specific Gravity, Urine: 1.025 (ref 1.005–1.030)
pH: 5 (ref 5.0–8.0)

## 2017-01-25 MED ORDER — KETOROLAC TROMETHAMINE 30 MG/ML IJ SOLN
30.0000 mg | Freq: Once | INTRAMUSCULAR | Status: AC
  Administered 2017-01-25: 30 mg via INTRAMUSCULAR
  Filled 2017-01-25: qty 1

## 2017-01-25 MED ORDER — IBUPROFEN 200 MG PO TABS: 800.0000 mg | ORAL_TABLET | Freq: Three times a day (TID) | ORAL | 0 refills | Status: DC | PRN

## 2017-01-25 NOTE — ED Provider Notes (Signed)
[facility]   CSN: [id number] Arrival date & time: 01/24/17  2024     History   Chief Complaint Chief Complaint  Patient presents with  . Testicle Pain  . Inguinal Hernia    HPI Brent BullocksHarold E Phung Jr. is a 41 y.o. male.  Patient is here with complaint of bilateral testicular pain x 3-4 days. No urinary symptoms. He is moving bowels regularly. History of multiple hernia repairs per patient, last one in 2011. He states scrotum is swelling and he feels he has a recurrent bilateral inguinal hernias. No fever, abdominal pain, nausea, vomiting. No penile discharge. He does complain of a rash in his groin that started 2 days ago.    The history is provided by the patient. No language interpreter was used.  Testicle Pain  Pertinent negatives include no abdominal pain.    Past Medical History:  Diagnosis Date  . Anxiety   . Arthritis   . Cervical stenosis of spinal canal   . Depression   . Fibromyalgia   . Headache   . Hernia    Bilateral Inguinal  . Pancreatitis   . Syncope    April 2016    Patient Active Problem List   Diagnosis Date Noted  . Mood disorder (HCC) 03/01/2014  . Suicidal ideation   . Pancreatitis, acute 03/01/2013  . Abdominal pain 03/01/2013  . Tobacco abuse disorder 08/29/2011  . PERS HX NONCOMPLIANCE W/MED TX PRS HAZARDS HLTH 05/08/2009  . TOBACCO USE 03/13/2009  . LYME DISEASE, HX OF 11/02/2008  . ABDOMINAL PAIN OTHER SPECIFIED SITE 10/11/2008  . SPINAL STENOSIS, CERVICAL 05/02/2008  . ANXIETY 11/19/2007  . History of drug dependence (HCC) 04/10/2007  . Depression 08/12/2006    Past Surgical History:  Procedure Laterality Date  . Extraction of tooth #'s 2,4,5,6,7,8,9,10,11,12,13,14,15, [id number],20,21,22,23,24,25,26,27,28 with alveoloplasty and bilateral mandibular tori reductions N/A 10/13/2014   Performed by Charlynne PanderKulinski, Ronald F[facility] DDS [facility]  . INGUINAL HERNIA REPAIR    . thumb surgery          Home Medications    Prior to Admission medications   Medication Sig Start Date End Date Taking? Authorizing Provider  ibuprofen (ADVIL,MOTRIN) 200 MG tablet Take 800 mg 2 (two) times daily as needed by mouth for moderate pain. Reported on 09/01/2015   Yes [provider]  ranitidine (ZANTAC) 150 MG tablet Take 1 tablet (150 mg total) by mouth 2 (two) times daily. 01/01/17   Gilda CreasePollina, Christopher J, MD  sucralfate (CARAFATE) 1 GM/10ML suspension Take 10 mLs (1 g total) by mouth 4 (four) times daily -  with meals and at bedtime. 01/01/17   Gilda CreasePollina, Christopher J, MD    Family History Family History  Problem Relation Age of Onset  . Hypertension Mother   . Heart disease Father   . Crohn's disease Maternal Aunt   . Colon cancer Paternal Grandfather     Social History Social History   Tobacco Use  . Smoking status: Current Every Day Smoker    Packs/day: 3.50    Years: 22.00    Pack years: 77.00    Types: Cigarettes    Last attempt to quit: 10/01/2014    Years since quitting: 2.3  . Smokeless tobacco: Never Used  . Tobacco comment: uses a nicotine patch  Substance Use Topics  . Alcohol use: No    Alcohol/week: 0.0 oz  . Drug use: No     Allergies   Iohexol  Review of Systems Review of Systems  Constitutional: Negative for chills and fever.  Gastrointestinal: Negative.  Negative for abdominal pain, constipation, nausea and vomiting.  Genitourinary: Positive for scrotal swelling and testicular pain. Negative for difficulty urinating, discharge and dysuria.  Musculoskeletal: Negative.  Negative for back pain.  Skin: Positive for rash.  Neurological: Negative.      Physical Exam Updated Vital Signs BP 130/85 (BP Location: Right Arm)   Pulse 98   Temp 98.7 F (37.1 C) (Oral)   Resp 18   SpO2 98%   Physical Exam  Constitutional: He is oriented to person, place, and time. He appears well-developed and well-nourished.  Neck: Normal range of motion.   Pulmonary/Chest: Effort normal.  Abdominal: Soft. He exhibits no distension and no mass. There is no tenderness. No hernia.  Genitourinary:  Genitourinary Comments: Circumcised penis without discharge. Mild scrotal swelling with bilateral testicular tenderness. No hernia appreciated.   Musculoskeletal: Normal range of motion.  Neurological: He is alert and oriented to person, place, and time.  Skin: Skin is warm and dry.  Red, slightly raised rash in bilateral inguinal folds of groin.   Psychiatric: He has a normal mood and affect.     ED Treatments / Results  Labs (all labs ordered are listed, but only abnormal results are displayed) Labs Reviewed  URINALYSIS, ROUTINE W REFLEX MICROSCOPIC    EKG  EKG Interpretation None       Radiology No results found.  Procedures Procedures (including critical care time)  Medications Ordered in ED Medications  ketorolac (TORADOL) 30 MG/ML injection 30 mg (30 mg Intramuscular Given 01/25/17 0535)     Initial Impression / Assessment and Plan / ED Course  I have reviewed the triage vital signs and the nursing notes.  Pertinent labs & imaging results that were available during my care of the patient were reviewed by me and considered in my medical decision making (see chart for details).     Patient here for evaluation of bilateral scrotal pain he felt was recurrent bilateral hernias previously repaired. No constipation or pain with BM. No difficulty urinating. No fever or abdominal pain  Will obtain scrotal US to insure there is no evidence of torsion. Do not feel there is a significant hernia and exam does not support incarceration if undetected hernia exists.   If US negative, he can be discharged home and should follow up outpatient if pain persists.   US negative for torsion or evidence epididymitis. No hernia appreciated on exam. He can be discharged home and should follow up with [facility] if he feels a hernia is present not  appreciated on exam today.   Final Clinical Impressions(s) / ED Diagnoses   Final diagnoses:  None   1. Bilateral scrotal pain  ED Discharge Orders    None       Elpidio AnisUpstill, Kirsty Monjaraz, PA-C 01/25/17 [id number]    Elpidio AnisUpstill, Carlyne Keehan, PA-C 01/25/17 [phone]    Shon BatonHorton, Courtney F, MD 01/25/17 [id number]

## 2017-01-25 NOTE — ED Notes (Signed)
Pt departed in NAD, refused use of wheelchair.  

## 2017-01-25 NOTE — Discharge Instructions (Signed)
If pain continues, [facility] for further evaluation of possible hernia.

## 2017-01-25 NOTE — ED Notes (Signed)
Patient transported to Ultrasound 

## 2017-02-07 IMAGING — DX DG HAND COMPLETE 3+V*L*
3 series · 3 of 3 positions shown · non-contrast
Comparison: 06/09/2009

CLINICAL DATA: Pain post blunt trauma

EXAM:
LEFT HAND - COMPLETE 3+ VIEW

[hand pa]
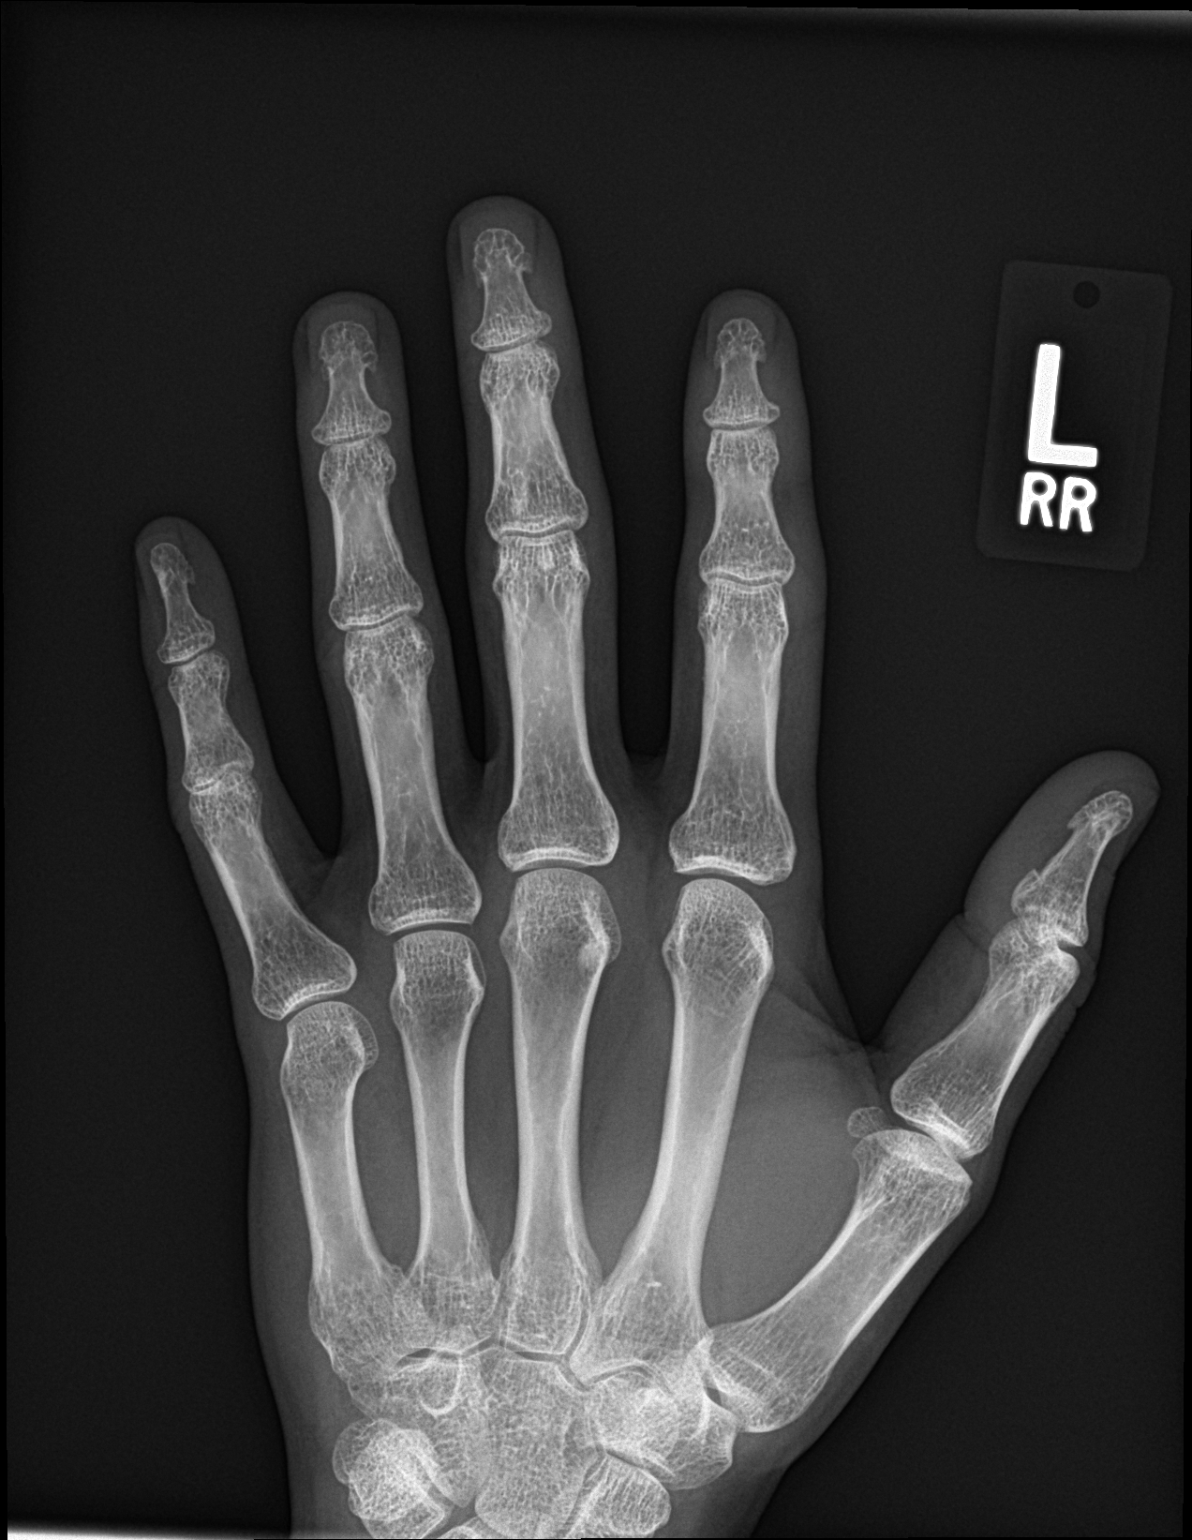

[hand obl]
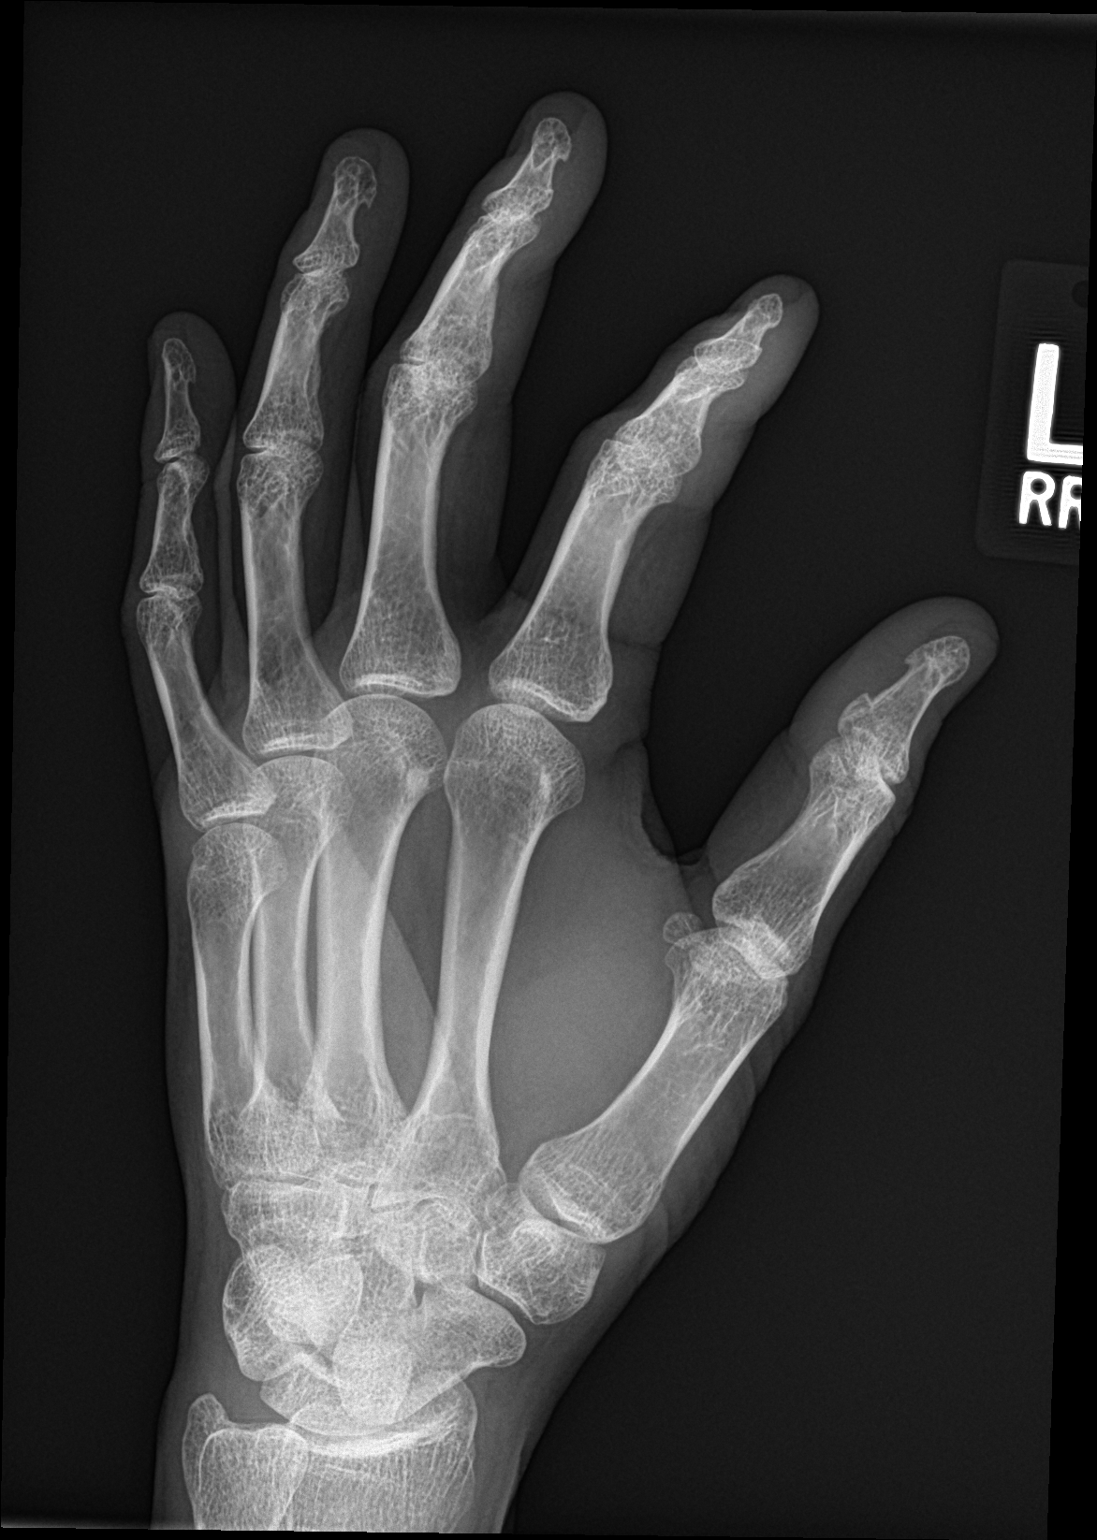

[hand lat]
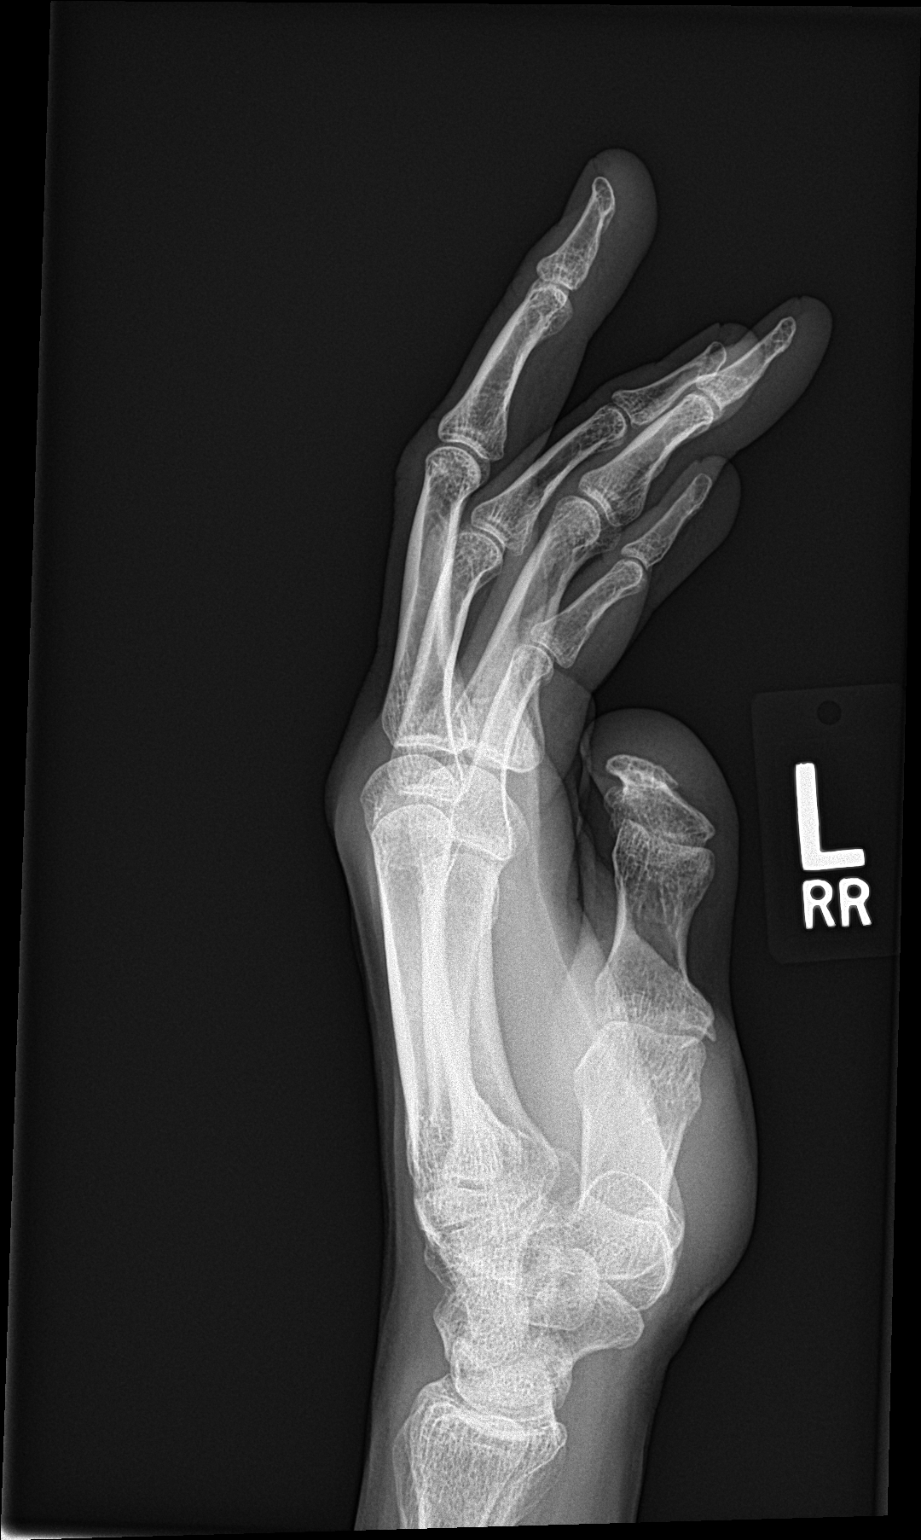

[3 of 3 positions shown; findings below may reference images not displayed]

FINDINGS: There is no evidence of fracture or dislocation. There is no
evidence of arthropathy or other focal bone abnormality. Soft
tissues are unremarkable.
IMPRESSION: Negative.

## 2017-07-28 DIAGNOSIS — R05 Cough: Secondary | ICD-10-CM | POA: Diagnosis not present

## 2017-07-28 DIAGNOSIS — R062 Wheezing: Secondary | ICD-10-CM | POA: Diagnosis not present

## 2017-07-28 DIAGNOSIS — J188 Other pneumonia, unspecified organism: Secondary | ICD-10-CM | POA: Diagnosis not present

## 2017-09-03 DIAGNOSIS — R3916 Straining to void: Secondary | ICD-10-CM | POA: Diagnosis not present

## 2017-09-03 DIAGNOSIS — M4802 Spinal stenosis, cervical region: Secondary | ICD-10-CM | POA: Diagnosis not present

## 2017-09-03 DIAGNOSIS — Z131 Encounter for screening for diabetes mellitus: Secondary | ICD-10-CM | POA: Diagnosis not present

## 2017-09-03 DIAGNOSIS — K922 Gastrointestinal hemorrhage, unspecified: Secondary | ICD-10-CM | POA: Diagnosis not present

## 2017-09-03 DIAGNOSIS — F329 Major depressive disorder, single episode, unspecified: Secondary | ICD-10-CM | POA: Diagnosis not present

## 2017-09-03 DIAGNOSIS — Z7289 Other problems related to lifestyle: Secondary | ICD-10-CM | POA: Diagnosis not present

## 2017-09-03 DIAGNOSIS — Z114 Encounter for screening for human immunodeficiency virus [HIV]: Secondary | ICD-10-CM | POA: Diagnosis not present

## 2017-09-03 DIAGNOSIS — Z125 Encounter for screening for malignant neoplasm of prostate: Secondary | ICD-10-CM | POA: Diagnosis not present

## 2017-09-03 DIAGNOSIS — R05 Cough: Secondary | ICD-10-CM | POA: Diagnosis not present

## 2017-09-03 DIAGNOSIS — R799 Abnormal finding of blood chemistry, unspecified: Secondary | ICD-10-CM | POA: Diagnosis not present

## 2017-09-03 DIAGNOSIS — R399 Unspecified symptoms and signs involving the genitourinary system: Secondary | ICD-10-CM | POA: Diagnosis not present

## 2017-09-03 DIAGNOSIS — Z1159 Encounter for screening for other viral diseases: Secondary | ICD-10-CM | POA: Diagnosis not present

## 2017-09-05 DIAGNOSIS — Z79899 Other long term (current) drug therapy: Secondary | ICD-10-CM | POA: Diagnosis not present

## 2017-09-05 DIAGNOSIS — K210 Gastro-esophageal reflux disease with esophagitis: Secondary | ICD-10-CM | POA: Diagnosis not present

## 2017-09-05 DIAGNOSIS — K254 Chronic or unspecified gastric ulcer with hemorrhage: Secondary | ICD-10-CM | POA: Diagnosis not present

## 2017-09-05 DIAGNOSIS — K921 Melena: Secondary | ICD-10-CM | POA: Diagnosis not present

## 2017-09-05 DIAGNOSIS — K3189 Other diseases of stomach and duodenum: Secondary | ICD-10-CM | POA: Diagnosis not present

## 2017-09-05 DIAGNOSIS — F17200 Nicotine dependence, unspecified, uncomplicated: Secondary | ICD-10-CM | POA: Diagnosis not present

## 2017-09-10 DIAGNOSIS — R0602 Shortness of breath: Secondary | ICD-10-CM | POA: Diagnosis not present

## 2017-09-10 DIAGNOSIS — R918 Other nonspecific abnormal finding of lung field: Secondary | ICD-10-CM | POA: Diagnosis not present

## 2017-09-10 DIAGNOSIS — R05 Cough: Secondary | ICD-10-CM | POA: Diagnosis not present

## 2017-11-26 NOTE — Telephone Encounter (Signed)
This encounter was created in error - please disregard.

## 2017-12-02 DIAGNOSIS — Z79899 Other long term (current) drug therapy: Secondary | ICD-10-CM | POA: Diagnosis not present

## 2017-12-02 DIAGNOSIS — M4802 Spinal stenosis, cervical region: Secondary | ICD-10-CM | POA: Diagnosis not present

## 2017-12-02 DIAGNOSIS — Z5181 Encounter for therapeutic drug level monitoring: Secondary | ICD-10-CM | POA: Diagnosis not present

## 2019-06-23 ENCOUNTER — Ambulatory Visit: Payer: Medicare Other

## 2019-07-30 ENCOUNTER — Ambulatory Visit: Payer: Medicare Other | Admitting: Physical Therapy

## 2020-11-19 ENCOUNTER — Other Ambulatory Visit: Payer: Self-pay

## 2020-11-19 ENCOUNTER — Encounter (HOSPITAL_COMMUNITY): Payer: Self-pay | Admitting: Emergency Medicine

## 2020-11-19 ENCOUNTER — Inpatient Hospital Stay (HOSPITAL_COMMUNITY)
Admission: EM | Admit: 2020-11-19 | Discharge: 2020-11-22 | DRG: 392 | Disposition: A | Discharge status: Home / Self Care | Payer: Medicare Other | Attending: Internal Medicine | Admitting: Internal Medicine

## 2020-11-19 ENCOUNTER — Emergency Department (HOSPITAL_COMMUNITY): Payer: Medicare Other

## 2020-11-19 DIAGNOSIS — R1013 Epigastric pain: Secondary | ICD-10-CM

## 2020-11-19 DIAGNOSIS — G894 Chronic pain syndrome: Secondary | ICD-10-CM | POA: Diagnosis present

## 2020-11-19 DIAGNOSIS — R112 Nausea with vomiting, unspecified: Principal | ICD-10-CM | POA: Diagnosis present

## 2020-11-19 DIAGNOSIS — R251 Tremor, unspecified: Secondary | ICD-10-CM | POA: Diagnosis present

## 2020-11-19 DIAGNOSIS — M797 Fibromyalgia: Secondary | ICD-10-CM | POA: Diagnosis present

## 2020-11-19 DIAGNOSIS — F17210 Nicotine dependence, cigarettes, uncomplicated: Secondary | ICD-10-CM | POA: Diagnosis present

## 2020-11-19 DIAGNOSIS — Z20822 Contact with and (suspected) exposure to covid-19: Secondary | ICD-10-CM | POA: Diagnosis present

## 2020-11-19 DIAGNOSIS — E876 Hypokalemia: Secondary | ICD-10-CM | POA: Diagnosis present

## 2020-11-19 DIAGNOSIS — Z79891 Long term (current) use of opiate analgesic: Secondary | ICD-10-CM

## 2020-11-19 DIAGNOSIS — R109 Unspecified abdominal pain: Secondary | ICD-10-CM | POA: Diagnosis present

## 2020-11-19 DIAGNOSIS — Z800 Family history of malignant neoplasm of digestive organs: Secondary | ICD-10-CM

## 2020-11-19 DIAGNOSIS — K219 Gastro-esophageal reflux disease without esophagitis: Secondary | ICD-10-CM | POA: Diagnosis present

## 2020-11-19 DIAGNOSIS — F1210 Cannabis abuse, uncomplicated: Secondary | ICD-10-CM | POA: Diagnosis present

## 2020-11-19 DIAGNOSIS — R197 Diarrhea, unspecified: Secondary | ICD-10-CM | POA: Diagnosis present

## 2020-11-19 DIAGNOSIS — R748 Abnormal levels of other serum enzymes: Secondary | ICD-10-CM | POA: Diagnosis present

## 2020-11-19 DIAGNOSIS — Z720 Tobacco use: Secondary | ICD-10-CM

## 2020-11-19 DIAGNOSIS — Z79899 Other long term (current) drug therapy: Secondary | ICD-10-CM

## 2020-11-19 DIAGNOSIS — F1921 Other psychoactive substance dependence, in remission: Secondary | ICD-10-CM

## 2020-11-19 DIAGNOSIS — Z8249 Family history of ischemic heart disease and other diseases of the circulatory system: Secondary | ICD-10-CM

## 2020-11-19 DIAGNOSIS — F411 Generalized anxiety disorder: Secondary | ICD-10-CM | POA: Diagnosis present

## 2020-11-19 DIAGNOSIS — M1990 Unspecified osteoarthritis, unspecified site: Secondary | ICD-10-CM | POA: Diagnosis present

## 2020-11-19 DIAGNOSIS — F32A Depression, unspecified: Secondary | ICD-10-CM | POA: Diagnosis present

## 2020-11-19 LAB — CBC WITH DIFFERENTIAL/PLATELET
Abs Immature Granulocytes: 0.02 10*3/uL (ref 0.00–0.07)
Basophils Absolute: 0.1 10*3/uL (ref 0.0–0.1)
Basophils Relative: 1 %
Eosinophils Absolute: 0 10*3/uL (ref 0.0–0.5)
Eosinophils Relative: 0 %
HCT: 46.7 % (ref 39.0–52.0)
Hemoglobin: 15.8 g/dL (ref 13.0–17.0)
Immature Granulocytes: 0 %
Lymphocytes Relative: 16 %
Lymphs Abs: 1.7 10*3/uL (ref 0.7–4.0)
MCH: 29.6 pg (ref 26.0–34.0)
MCHC: 33.8 g/dL (ref 30.0–36.0)
MCV: 87.6 fL (ref 80.0–100.0)
Monocytes Absolute: 0.5 10*3/uL (ref 0.1–1.0)
Monocytes Relative: 5 %
Neutro Abs: 8 10*3/uL — ABNORMAL HIGH (ref 1.7–7.7)
Neutrophils Relative %: 78 %
Platelets: 336 10*3/uL (ref 150–400)
RBC: 5.33 MIL/uL (ref 4.22–5.81)
RDW: 12 % (ref 11.5–15.5)
WBC: 10.3 10*3/uL (ref 4.0–10.5)
nRBC: 0 % (ref 0.0–0.2)

## 2020-11-19 LAB — COMPREHENSIVE METABOLIC PANEL
ALT: 20 U/L (ref 0–44)
AST: 22 U/L (ref 15–41)
Albumin: 4.4 g/dL (ref 3.5–5.0)
Alkaline Phosphatase: 127 U/L — ABNORMAL HIGH (ref 38–126)
Anion gap: 12 (ref 5–15)
BUN: 17 mg/dL (ref 6–20)
CO2: 22 mmol/L (ref 22–32)
Calcium: 9.9 mg/dL (ref 8.9–10.3)
Chloride: 104 mmol/L (ref 98–111)
Creatinine, Ser: 1.09 mg/dL (ref 0.61–1.24)
GFR, Estimated: >60 mL/min (ref >60)
Glucose, Bld: 112 mg/dL — ABNORMAL HIGH (ref 70–99)
Potassium: 3.7 mmol/L (ref 3.5–5.1)
Sodium: 138 mmol/L (ref 135–145)
Total Bilirubin: 0.8 mg/dL (ref 0.3–1.2)
Total Protein: 8.2 g/dL — ABNORMAL HIGH (ref 6.5–8.1)

## 2020-11-19 LAB — URINALYSIS, ROUTINE W REFLEX MICROSCOPIC
Bacteria, UA: NONE SEEN
Glucose, UA: NEGATIVE mg/dL
Hgb urine dipstick: NEGATIVE
Ketones, ur: 40 mg/dL — AB
Leukocytes,Ua: NEGATIVE
Nitrite: NEGATIVE
Protein, ur: NEGATIVE mg/dL
Specific Gravity, Urine: 1.025 (ref 1.005–1.030)
pH: 6 (ref 5.0–8.0)

## 2020-11-19 LAB — RAPID URINE DRUG SCREEN, HOSP PERFORMED
Amphetamines: NOT DETECTED
Barbiturates: NOT DETECTED
Benzodiazepines: POSITIVE — AB
Cocaine: NOT DETECTED
Opiates: POSITIVE — AB
Tetrahydrocannabinol: POSITIVE — AB

## 2020-11-19 LAB — RESP PANEL BY RT-PCR (FLU A&B, COVID) ARPGX2
Influenza A by PCR: NEGATIVE
Influenza B by PCR: NEGATIVE
SARS Coronavirus 2 by RT PCR: NEGATIVE

## 2020-11-19 LAB — LIPASE, BLOOD: Lipase: 21 U/L (ref 11–51)

## 2020-11-19 LAB — TROPONIN I (HIGH SENSITIVITY)
Troponin I (High Sensitivity): 2 ng/L (ref <18)
Troponin I (High Sensitivity): <2 ng/L (ref <18)

## 2020-11-19 MED ORDER — ALUM & MAG HYDROXIDE-SIMETH 200-200-20 MG/5ML PO SUSP
30.0000 mL | Freq: Once | ORAL | Status: AC
  Administered 2020-11-19: 30 mL via ORAL
  Filled 2020-11-19: qty 30

## 2020-11-19 MED ORDER — DEXTROSE-NACL 5-0.9 % IV SOLN: INTRAVENOUS | Status: DC

## 2020-11-19 MED ORDER — IOHEXOL 350 MG/ML SOLN
80.0000 mL | Freq: Once | INTRAVENOUS | Status: AC | PRN
  Administered 2020-11-19: 80 mL via INTRAVENOUS

## 2020-11-19 MED ORDER — NICOTINE 21 MG/24HR TD PT24
21.0000 mg | MEDICATED_PATCH | Freq: Every day | TRANSDERMAL | Status: DC
  Administered 2020-11-20 – 2020-11-21 (×2): 21 mg via TRANSDERMAL
  Filled 2020-11-19 (×2): qty 1

## 2020-11-19 MED ORDER — MORPHINE SULFATE (PF) 2 MG/ML IV SOLN
2.0000 mg | INTRAVENOUS | Status: DC | PRN
  Administered 2020-11-19 – 2020-11-21 (×8): 2 mg via INTRAVENOUS
  Filled 2020-11-19 (×9): qty 1

## 2020-11-19 MED ORDER — ONDANSETRON HCL 4 MG/2ML IJ SOLN
4.0000 mg | Freq: Four times a day (QID) | INTRAMUSCULAR | Status: DC | PRN
  Administered 2020-11-21: 4 mg via INTRAVENOUS
  Filled 2020-11-19: qty 2

## 2020-11-19 MED ORDER — PANTOPRAZOLE SODIUM 40 MG PO TBEC
40.0000 mg | DELAYED_RELEASE_TABLET | Freq: Every day | ORAL | Status: DC
  Administered 2020-11-20 – 2020-11-22 (×4): 40 mg via ORAL
  Filled 2020-11-19 (×4): qty 1

## 2020-11-19 MED ORDER — DIPHENHYDRAMINE HCL 50 MG/ML IJ SOLN
25.0000 mg | Freq: Once | INTRAMUSCULAR | Status: AC
  Administered 2020-11-19: 25 mg via INTRAVENOUS
  Filled 2020-11-19: qty 1

## 2020-11-19 MED ORDER — MORPHINE SULFATE (PF) 4 MG/ML IV SOLN
4.0000 mg | Freq: Once | INTRAVENOUS | Status: AC
  Administered 2020-11-19: 4 mg via INTRAVENOUS
  Filled 2020-11-19: qty 1

## 2020-11-19 MED ORDER — FAMOTIDINE IN NACL 20-0.9 MG/50ML-% IV SOLN
20.0000 mg | Freq: Once | INTRAVENOUS | Status: AC
  Administered 2020-11-19: 20 mg via INTRAVENOUS
  Filled 2020-11-19: qty 50

## 2020-11-19 MED ORDER — ENOXAPARIN SODIUM 40 MG/0.4ML IJ SOSY
40.0000 mg | PREFILLED_SYRINGE | INTRAMUSCULAR | Status: DC
  Administered 2020-11-20 – 2020-11-22 (×3): 40 mg via SUBCUTANEOUS
  Filled 2020-11-19 (×3): qty 0.4

## 2020-11-19 MED ORDER — ONDANSETRON HCL 4 MG PO TABS: 4.0000 mg | ORAL_TABLET | Freq: Four times a day (QID) | ORAL | Status: DC | PRN

## 2020-11-19 MED ORDER — LIDOCAINE VISCOUS HCL 2 % MT SOLN
15.0000 mL | Freq: Once | OROMUCOSAL | Status: AC
  Administered 2020-11-19: 15 mL via ORAL
  Filled 2020-11-19: qty 15

## 2020-11-19 MED ORDER — DROPERIDOL 2.5 MG/ML IJ SOLN
1.2500 mg | Freq: Once | INTRAMUSCULAR | Status: AC
  Administered 2020-11-19: 1.25 mg via INTRAVENOUS
  Filled 2020-11-19: qty 2

## 2020-11-19 MED ORDER — LACTATED RINGERS IV BOLUS
1000.0000 mL | Freq: Once | INTRAVENOUS | Status: AC
  Administered 2020-11-19: 1000 mL via INTRAVENOUS

## 2020-11-19 MED ORDER — MORPHINE SULFATE (PF) 4 MG/ML IV SOLN
4.0000 mg | Freq: Once | INTRAVENOUS | Status: AC
Start: 2020-11-19 — End: 2020-11-19
  Administered 2020-11-19: 4 mg via INTRAVENOUS
  Filled 2020-11-19: qty 1

## 2020-11-19 MED ORDER — ONDANSETRON HCL 4 MG/2ML IJ SOLN
4.0000 mg | Freq: Once | INTRAMUSCULAR | Status: AC
  Administered 2020-11-19: 4 mg via INTRAVENOUS
  Filled 2020-11-19: qty 2

## 2020-11-19 NOTE — ED Provider Notes (Signed)
[facility]-EMERGENCY DEPT Provider Note   CSN: [id number] Arrival date & time: 11/19/20  1206     History Chief Complaint  Patient presents with  . Fatigue  . Cough  . Emesis    Brent Hirt Lotter Montez Hageman. is a 45 y.o. male with PMH previous pancreatitis [facility] emergency department for evaluation of epigastric abdominal pain, nausea, vomiting and chills.  Patient states pain has persisted for approximately 4 days and is gradually worsened.  States that the pain begins in the epigastrium and radiates up into the chest.  States he has had multiple episodes of nonbloody nonbilious emesis.  Denies hematochezia, hematemesis.  Denies documented fever, shortness of breath, headache, diarrhea or other systemic symptoms.   Cough Associated symptoms: no chest pain, no chills, no ear pain, no fever, no rash, no shortness of breath and no sore throat   Emesis Associated symptoms: cough   Associated symptoms: no abdominal pain, no arthralgias, no chills, no fever and no sore throat       Past Medical History:  Diagnosis Date  . Anxiety   . Arthritis   . Cervical stenosis of spinal canal   . Depression   . Fibromyalgia   . Headache   . Hernia    Bilateral Inguinal  . Pancreatitis   . Syncope    April 2016    Patient Active Problem List   Diagnosis Date Noted  . Mood disorder (HCC) 03/01/2014  . Suicidal ideation   . Pancreatitis, acute 03/01/2013  . Abdominal pain 03/01/2013  . Tobacco abuse disorder 08/29/2011  . PERS HX NONCOMPLIANCE W/MED TX PRS HAZARDS HLTH 05/08/2009  . TOBACCO USE 03/13/2009  . LYME DISEASE, HX OF 11/02/2008  . ABDOMINAL PAIN OTHER SPECIFIED SITE 10/11/2008  . SPINAL STENOSIS, CERVICAL 05/02/2008  . ANXIETY 11/19/2007  . History of drug dependence (HCC) 04/10/2007  . Depression 08/12/2006    Past Surgical History:  Procedure Laterality Date  . INGUINAL HERNIA REPAIR    . MULTIPLE EXTRACTIONS WITH ALVEOLOPLASTY N/A 10/13/2014   Procedure:  Extraction of tooth #'s 2,4,5,6,7,8,9,10,11,12,13,14,15, 94,76,54,65,03,54,65,68,12,75 with alveoloplasty and bilateral mandibular tori reductions;  Surgeon: Charlynne Pander, DDS;  Location: [facility] OR;  Service: Oral Surgery;  Laterality: N/A;  NASAL TUBE  . thumb surgery         Family History  Problem Relation Age of Onset  . Hypertension Mother   . Heart disease Father   . Crohn's disease Maternal Aunt   . Colon cancer Paternal Grandfather     Social History   Tobacco Use  . Smoking status: Every Day    Packs/day: 3.50    Years: 22.00    Pack years: 77.00    Types: Cigarettes    Last attempt to quit: 10/01/2014    Years since quitting: 6.1  . Smokeless tobacco: Never  . Tobacco comments:    uses a nicotine patch  Substance Use Topics  . Alcohol use: No    Alcohol/week: 0.0 standard drinks  . Drug use: No    Home Medications Prior to Admission medications   Medication Sig Start Date End Date Taking? Authorizing Provider  ibuprofen (ADVIL,MOTRIN) 200 MG tablet Take 4 tablets (800 mg total) every 8 (eight) hours as needed by mouth for moderate pain. Reported on 09/01/2015 01/25/17   Elpidio Anis, PA-C  ranitidine (ZANTAC) 150 MG tablet Take 1 tablet (150 mg total) by mouth 2 (two) times daily. 01/01/17   Gilda Crease, MD  sucralfate (CARAFATE) 1  GM/10ML suspension Take 10 mLs (1 g total) by mouth 4 (four) times daily -  with meals and at bedtime. 01/01/17   Gilda Crease, MD    Allergies    Iohexol  Review of Systems   Review of Systems  Constitutional:  Negative for chills and fever.  HENT:  Negative for ear pain and sore throat.   Eyes:  Negative for pain and visual disturbance.  Respiratory:  Positive for cough. Negative for shortness of breath.   Cardiovascular:  Negative for chest pain and palpitations.  Gastrointestinal:  Positive for nausea and vomiting. Negative for abdominal pain.  Genitourinary:  Negative for dysuria and hematuria.   Musculoskeletal:  Negative for arthralgias and back pain.  Skin:  Negative for color change and rash.  Neurological:  Negative for seizures and syncope.  All other systems reviewed and are negative.  Physical Exam Updated Vital Signs BP (!) 146/94 (BP Location: Left Arm)   Pulse 89   Temp 98.5 F (36.9 C) (Oral)   Resp 18   Ht 6' (1.829 m)   Wt 71 kg   SpO2 99%   BMI 21.23 kg/m   Physical Exam Vitals and nursing note reviewed.  Constitutional:      Appearance: He is well-developed. He is ill-appearing.  HENT:     Head: Normocephalic and atraumatic.  Eyes:     Conjunctiva/sclera: Conjunctivae normal.  Cardiovascular:     Rate and Rhythm: Normal rate and regular rhythm.     Heart sounds: No murmur heard. Pulmonary:     Effort: Pulmonary effort is normal. No respiratory distress.     Breath sounds: Normal breath sounds.  Abdominal:     Palpations: Abdomen is soft.     Tenderness: There is abdominal tenderness (epigastric, LLQ).  Musculoskeletal:     Cervical back: Neck supple.  Skin:    General: Skin is warm and dry.  Neurological:     Mental Status: He is alert.    ED Results / Procedures / Treatments   Labs (all labs ordered are listed, but only abnormal results are displayed) Labs Reviewed  CBC WITH DIFFERENTIAL/PLATELET - Abnormal; Notable for the following components:      Result Value   Neutro Abs 8.0 (*)    All other components within normal limits  COMPREHENSIVE METABOLIC PANEL - Abnormal; Notable for the following components:   Glucose, Bld 112 (*)    Total Protein 8.2 (*)    Alkaline Phosphatase 127 (*)    All other components within normal limits  RESP PANEL BY RT-PCR (FLU A&B, COVID) ARPGX2  LIPASE, BLOOD  URINALYSIS, ROUTINE W REFLEX MICROSCOPIC  TROPONIN I (HIGH SENSITIVITY)  TROPONIN I (HIGH SENSITIVITY)    EKG None  Radiology DG Chest Portable 1 View  Result Date: 11/19/2020 CLINICAL DATA:  Chest pain and fever. EXAM: PORTABLE CHEST  1 VIEW COMPARISON:  December 31, 2016 FINDINGS: The heart size and mediastinal contours are within normal limits. Both lungs are clear. The visualized skeletal structures are unremarkable. IMPRESSION: No active disease. Electronically Signed   By: Sherian Rein M.D.   On: 11/19/2020 13:57    Procedures Procedures   Medications Ordered in ED Medications - No data to display  ED Course  I have reviewed the triage vital signs and the nursing notes.  Pertinent labs & imaging results that were available during my care of the patient were reviewed by me and considered in my medical decision making (see chart for details).  Clinical Course as of 11/19/20 2052  Wynelle Link Nov 19, 2020  2051 Protein: NEGATIVE [MK]    Clinical Course User Index [MK] Daniel Johndrow, Wyn Forster, MD   MDM Rules/Calculators/A&P                           Patient seen emergency department for evaluation of abdominal pain.  Physical exam reveals an ill-appearing patient with active nausea and vomiting that is tender in the epigastric region.  Physical exam otherwise unremarkable.  Laboratory evaluation unremarkable, high-sensitivity troponin unremarkable, lipase negative, UA unremarkable.  CT abdomen pelvis with no acute intra abdominal pathology.  Patient given morphine initially with no improvement of the patient's pain, patient given a GI cocktail and Pepcid which induced persistent vomiting.  On reevaluation, patient had persistent abdominal pain with no improvement in symptoms so he was given 1.25 mg of droperidol which improved his symptoms only minimally.  Patient is still unable to tolerate p.o. and is not safe for discharge at this time.  Patient will require admission for persistent abdominal pain, pain control and inability to tolerate p.o. Final Clinical Impression(s) / ED Diagnoses Final diagnoses:  None    Rx / DC Orders ED Discharge Orders     None        Brent Morales, Wyn Forster, MD 11/19/20 2045

## 2020-11-19 NOTE — ED Provider Notes (Signed)
Patient originally had an allergy listed in his chart to iohexol contrast dye.  I spoke at length with the patient about this allergy, and he states that the allergy in question was "burning and fluid around the IV site" during the last contrast administration.  At no point during his medical care has the patient had anaphylaxis, itching, vomiting, swelling or any life-threatening complications from iohexol contrast dye.  I have thus removed this allergy from his chart.  He is safe for contrast imaging in the ER today.   Glendora Score, MD 11/19/20 1626

## 2020-11-19 NOTE — H&P (Signed)
History and Physical   Brent Morales. ZOX:[id number] DOB: 07/16/1975 DOA: 11/19/2020  Referring MD/NP/PA: Dr. Posey Rea  PCP: Floreen Comber, MD   Outpatient Specialists: None  Patient coming from: Home  Chief Complaint: Nausea with vomiting  HPI: Brent Morales. is a 45 y.o. male with medical history significant of chronic pain syndrome, fibromyalgia, osteoarthritis, depression with anxiety, cervical stenosis, prior pancreatitis and syncope who presented to the ER with intractable nausea with vomiting.  Also having generalized abdominal pain for the last 4 days.  He did have some diarrhea associated with it.  Denied any sick contact.  Denied any hematemesis or melena.  Denied any bright red blood per rectum.  Patient's pain is rated as 6 out of 10 colicky in nature.  No relationship to food.  He has been unable to keep anything down.  In the ER patient was treated empirically with combination of Zofran, droperidol as well as Phenergan but no relief.  He is therefore being admitted for observation and management of intractable nausea vomiting.  So far initial evaluation including CT Abdo pelvis is nonrevealing.  Suspected acute gastritis.  He does have history of GERD and takes PPIs..  ED Course: Temperature 98.5 blood pressure 140/96, pulse 92 respiratory 20, oxygen sat 95% on room air.  CBC and chemistry entirely within normal.  Alkaline phosphatase elevated 127.  Troponin so far negative.  CT abdomen pelvis showed no acute findings.  COVID-19 and influenza screen is negative.  Urinalysis showed WBC 6-10 but negative leukocytes or nitrites.  No bacteria.  Urine drug screen is positive for THC opiates and benzodiazepines.  Patient is being admitted with intractable nausea vomiting.  Review of Systems: As per HPI otherwise 10 point review of systems negative.    Past Medical History:  Diagnosis Date   Anxiety    Arthritis    Cervical stenosis of spinal canal    Depression     Fibromyalgia    Headache    Hernia    Bilateral Inguinal   Pancreatitis    Syncope    April 2016    Past Surgical History:  Procedure Laterality Date   INGUINAL HERNIA REPAIR     MULTIPLE EXTRACTIONS WITH ALVEOLOPLASTY N/A 10/13/2014   Procedure: Extraction of tooth #'s 2,4,5,6,7,8,9,10,11,12,13,14,15, 81,19,14,78,29,56,21,30,86,57 with alveoloplasty and bilateral mandibular tori reductions;  Surgeon: Charlynne Pander, DDS;  Location: [facility] OR;  Service: Oral Surgery;  Laterality: N/A;  NASAL TUBE   thumb surgery       reports that he has been smoking cigarettes. He has a 77.00 pack-year smoking history. He has never used smokeless tobacco. He reports that he does not drink alcohol and does not use drugs.  No Known Allergies  Family History  Problem Relation Age of Onset   Hypertension Mother    Heart disease Father    Crohn's disease Maternal Aunt    Colon cancer Paternal Grandfather      Prior to Admission medications   Medication Sig Start Date End Date Taking? Authorizing Provider  ibuprofen (ADVIL,MOTRIN) 200 MG tablet Take 4 tablets (800 mg total) every 8 (eight) hours as needed by mouth for moderate pain. Reported on 09/01/2015 Patient taking differently: Take 400 mg by mouth every 8 (eight) hours as needed for moderate pain. 01/25/17  Yes Upstill, Melvenia Beam, PA-C  DULoxetine (CYMBALTA) 30 MG capsule Take 30 mg by mouth daily. 07/14/20   [provider]  gabapentin (NEURONTIN) 300 MG capsule Take 900 mg by mouth 3 (  three) times daily. 10/07/20   [provider]  hydrOXYzine (ATARAX/VISTARIL) 25 MG tablet Take 25 mg by mouth at bedtime. 11/15/20   [provider]  mirtazapine (REMERON SOL-TAB) 30 MG disintegrating tablet Take 30 mg by mouth at bedtime. 02/28/20   [provider]  omeprazole (PRILOSEC) 20 MG capsule Take 20 mg by mouth daily. 07/03/20 07/03/21  [provider]  oxyCODONE (ROXICODONE) 15 MG immediate release tablet Take 15 mg by  mouth 2 (two) times daily. 10/30/20   [provider]  ranitidine (ZANTAC) 150 MG tablet Take 1 tablet (150 mg total) by mouth 2 (two) times daily. Patient not taking: Reported on 11/19/2020 01/01/17   Gilda Crease, MD  sucralfate (CARAFATE) 1 GM/10ML suspension Take 10 mLs (1 g total) by mouth 4 (four) times daily -  with meals and at bedtime. Patient not taking: Reported on 11/19/2020 01/01/17   Gilda Crease, MD    Physical Exam: Vitals:   11/19/20 1900 11/19/20 1930 11/19/20 2000 11/19/20 2116  BP: (!) 147/82 133/85 129/89 124/78  Pulse: 92 71 82 88  Resp: 18  18 19   Temp:    98.2 F (36.8 C)  TempSrc:    Oral  SpO2: 99% 98% 98% 99%  Weight:      Height:          Constitutional: Anxious, no distress Vitals:   11/19/20 1900 11/19/20 1930 11/19/20 2000 11/19/20 2116  BP: (!) 147/82 133/85 129/89 124/78  Pulse: 92 71 82 88  Resp: 18  18 19   Temp:    98.2 F (36.8 C)  TempSrc:    Oral  SpO2: 99% 98% 98% 99%  Weight:      Height:       Eyes: PERRL, lids and conjunctivae normal ENMT: Mucous membranes are dry. Posterior pharynx clear of any exudate or lesions.Normal dentition.  Neck: normal, supple, no masses, no thyromegaly Respiratory: clear to auscultation bilaterally, no wheezing, no crackles. Normal respiratory effort. No accessory muscle use.  Cardiovascular: Regular rate and rhythm, no murmurs / rubs / gallops. No extremity edema. 2+ pedal pulses. No carotid bruits.  Abdomen: Diffuse tenderness, no masses palpated. No hepatosplenomegaly. Bowel sounds positive.  Musculoskeletal: no clubbing / cyanosis. No joint deformity upper and lower extremities. Good ROM, no contractures. Normal muscle tone.  Skin: no rashes, lesions, ulcers. No induration Neurologic: CN 2-12 grossly intact. Sensation intact, DTR normal. Strength 5/5 in all 4.  Psychiatric: Normal judgment and insight. Alert and oriented x 3. Normal mood.     Labs on Admission: I have  personally reviewed following labs and imaging studies  CBC: Recent Labs  Lab 11/19/20 1340  WBC 10.3  NEUTROABS 8.0*  HGB 15.8  HCT 46.7  MCV 87.6  PLT 336   Basic Metabolic Panel: Recent Labs  Lab 11/19/20 1340  NA 138  K 3.7  CL 104  CO2 22  GLUCOSE 112*  BUN 17  CREATININE 1.09  CALCIUM 9.9   GFR: Estimated Creatinine Clearance: 86.9 mL/min (by C-G formula based on SCr of 1.09 mg/dL). Liver Function Tests: Recent Labs  Lab 11/19/20 1340  AST 22  ALT 20  ALKPHOS 127*  BILITOT 0.8  PROT 8.2*  ALBUMIN 4.4   Recent Labs  Lab 11/19/20 1340  LIPASE 21   No results for input(s): AMMONIA in the last 168 hours. Coagulation Profile: No results for input(s): INR, PROTIME in the last 168 hours. Cardiac Enzymes: No results for input(s): CKTOTAL, CKMB,  CKMBINDEX, TROPONINI in the last 168 hours. BNP (last 3 results) No results for input(s): PROBNP in the last 8760 hours. HbA1C: No results for input(s): HGBA1C in the last 72 hours. CBG: No results for input(s): GLUCAP in the last 168 hours. Lipid Profile: No results for input(s): CHOL, HDL, LDLCALC, TRIG, CHOLHDL, LDLDIRECT in the last 72 hours. Thyroid Function Tests: No results for input(s): TSH, T4TOTAL, FREET4, T3FREE, THYROIDAB in the last 72 hours. Anemia Panel: No results for input(s): VITAMINB12, FOLATE, FERRITIN, TIBC, IRON, RETICCTPCT in the last 72 hours. Urine analysis:    Component Value Date/Time   COLORURINE YELLOW (A) 11/19/2020 1930   APPEARANCEUR HAZY (A) 11/19/2020 1930   LABSPEC 1.025 11/19/2020 1930   PHURINE 6.0 11/19/2020 1930   GLUCOSEU NEGATIVE 11/19/2020 1930   HGBUR NEGATIVE 11/19/2020 1930   HGBUR negative 10/07/2008 0752   BILIRUBINUR MODERATE (A) 11/19/2020 1930   BILIRUBINUR negative 04/03/2015 1740   KETONESUR 40 (A) 11/19/2020 1930   PROTEINUR NEGATIVE 11/19/2020 1930   UROBILINOGEN 0.2 04/03/2015 1740   UROBILINOGEN 1.0 10/01/2014 2010   NITRITE NEGATIVE 11/19/2020  1930   LEUKOCYTESUR NEGATIVE 11/19/2020 1930   Sepsis Labs: @LABRCNTIP (procalcitonin:4,lacticidven:4) ) Recent Results (from the past 240 hour(s))  Resp Panel by RT-PCR (Flu A&B, Covid) Nasopharyngeal Swab     Status: None   Collection Time: 11/19/20  3:35 PM   Specimen: Nasopharyngeal Swab; Nasopharyngeal(NP) swabs in vial transport medium  Result Value Ref Range Status   SARS Coronavirus 2 by RT PCR NEGATIVE NEGATIVE Final    Comment: (NOTE) SARS-CoV-2 target nucleic acids are NOT DETECTED.  The SARS-CoV-2 RNA is generally detectable in upper respiratory specimens during the acute phase of infection. The lowest concentration of SARS-CoV-2 viral copies this assay can detect is 138 copies/mL. A negative result does not preclude SARS-Cov-2 infection and should not be used as the sole basis for treatment or other patient management decisions. A negative result may occur with  improper specimen collection/handling, submission of specimen other than nasopharyngeal swab, presence of viral mutation(s) within the areas targeted by this assay, and inadequate number of viral copies(<138 copies/mL). A negative result must be combined with clinical observations, patient history, and epidemiological information. The expected result is Negative.  Fact Sheet for Patients:  BloggerCourse.comhttps://www.fda.gov/media/152166/download  Fact Sheet for Healthcare Providers:  SeriousBroker.ithttps://www.fda.gov/media/152162/download  This test is no t yet approved or cleared by the [place] FDA and  has been authorized for detection and/or diagnosis of SARS-CoV-2 by FDA under an Emergency Use Authorization (EUA). This EUA will remain  in effect (meaning this test can be used) for the duration of the COVID-19 declaration under Section 564(b)(1) of the Act, 21 U.S.C.section 360bbb-3(b)(1), unless the authorization is terminated  or revoked sooner.       Influenza A by PCR NEGATIVE NEGATIVE Final   Influenza B by PCR  NEGATIVE NEGATIVE Final    Comment: (NOTE) The Xpert Xpress SARS-CoV-2/FLU/RSV plus assay is intended as an aid in the diagnosis of influenza from Nasopharyngeal swab specimens and should not be used as a sole basis for treatment. Nasal washings and aspirates are unacceptable for Xpert Xpress SARS-CoV-2/FLU/RSV testing.  Fact Sheet for Patients: BloggerCourse.comhttps://www.fda.gov/media/152166/download  Fact Sheet for Healthcare Providers: SeriousBroker.ithttps://www.fda.gov/media/152162/download  This test is not yet approved or cleared by the [place] FDA and has been authorized for detection and/or diagnosis of SARS-CoV-2 by FDA under an Emergency Use Authorization (EUA). This EUA will remain in effect (meaning this test can be used) for the duration  of the COVID-19 declaration under Section 564(b)(1) of the Act, 21 U.S.C. section 360bbb-3(b)(1), unless the authorization is terminated or revoked.  Performed at [facility], [address] [place], [place] [address]      Radiological Exams on Admission: CT ABDOMEN PELVIS W CONTRAST  Result Date: 11/19/2020 CLINICAL DATA:  Epigastric pain. EXAM: CT ABDOMEN AND PELVIS WITH CONTRAST TECHNIQUE: Multidetector CT imaging of the abdomen and pelvis was performed using the standard protocol following bolus administration of intravenous contrast. CONTRAST:  59mL OMNIPAQUE IOHEXOL 350 MG/ML SOLN COMPARISON:  January 01, 2017 FINDINGS: Lower chest: No acute abnormality. Hepatobiliary: No focal liver abnormality is seen. No gallstones, gallbladder wall thickening, or biliary dilatation. Pancreas: Unremarkable. No pancreatic ductal dilatation or surrounding inflammatory changes. Spleen: Normal in size without focal abnormality. Adrenals/Urinary Tract: Adrenal glands are unremarkable. Kidneys are normal, without renal calculi, focal lesion, or hydronephrosis. Bladder is unremarkable. Stomach/Bowel: Stomach is within normal limits. Appendix appears normal.  No evidence of bowel wall thickening, distention, or inflammatory changes. Vascular/Lymphatic: No significant vascular findings are present. No enlarged abdominal or pelvic lymph nodes. Reproductive: Prostate is unremarkable. Other: No abdominal wall hernia or abnormality. No abdominopelvic ascites. Musculoskeletal: No acute or significant osseous findings. IMPRESSION: No evidence of acute abnormalities within the abdomen or pelvis. Electronically Signed   By: Ted Mcalpine M.D.   On: 11/19/2020 17:43   DG Chest Portable 1 View  Result Date: 11/19/2020 CLINICAL DATA:  Chest pain and fever. EXAM: PORTABLE CHEST 1 VIEW COMPARISON:  December 31, 2016 FINDINGS: The heart size and mediastinal contours are within normal limits. Both lungs are clear. The visualized skeletal structures are unremarkable. IMPRESSION: No active disease. Electronically Signed   By: Sherian Rein M.D.   On: 11/19/2020 13:57      Assessment/Plan Principal Problem:   Nausea & vomiting Active Problems:   Anxiety state   History of drug dependence (HCC)   Tobacco abuse disorder   Abdominal pain     #1 intractable nausea with vomiting: Suspected hyperemesis cannabinoids as patient is positive for cannabis.  At this point supportive care only.  Counseling to the patient.  He does have benzos and opiates on board with this and prescriptions.  Nose colitis and no pancreatitis.  Lipase also appears normal.  We will continue with IV hydration and supportive care.  #2 marijuana abuse: Counseling.  #3 tobacco abuse: Counseling with nicotine patch.  #4 chronic pain syndrome: On oxycodone.  Would be n.p.o. for now.  As needed morphine.  #5 anxiety disorder: Patient is on benzodiazepines.  As needed Ativan IV.  Resume home regimen prior to discharge   DVT prophylaxis: Lovenox Code Status: Full code Family Communication: No family at bedside Disposition Plan: Home Consults called: None Admission status:  Observation  Severity of Illness: The appropriate patient status for this patient is OBSERVATION. Observation status is judged to be reasonable and necessary in order to provide the required intensity of service to ensure the patient's safety. The patient's presenting symptoms, physical exam findings, and initial radiographic and laboratory data in the context of their medical condition is felt to place them at decreased risk for further clinical deterioration. Furthermore, it is anticipated that the patient will be medically stable for discharge from the hospital within 2 midnights of admission. The following factors support the patient status of observation.   " The patient's presenting symptoms include intractable nausea vomiting. " The physical exam findings include dry mucous membranes. " The initial radiographic and  laboratory data are normal labs but positive drug screen.   Lonia Blood MD [facility] Pager [phone]  If 7PM-7AM, please contact night-coverage [email or web address] Password [facility]  11/19/2020, 10:13 PM

## 2020-11-19 NOTE — ED Triage Notes (Signed)
Pt arrives via ems. C/o n/v/d, cough and fatigue x 4 days.

## 2020-11-19 NOTE — ED Notes (Signed)
ED TO INPATIENT HANDOFF REPORT  Name/Age/Gender Brent Morales. 45 y.o. male  Code Status Code Status History     Date Active Date Inactive Code Status Order ID Comments User Context   08/20/2015 2018 08/21/2015 0201 Full Code [id number]  Renne Crigler, PA-C ED   10/02/2014 0214 10/06/2014 1713 Full Code [id number]  Eston Esters, MD Inpatient   02/28/2014 1706 03/01/2014 1738 Full Code [id number]  Nanine Means, NP ED   03/01/2013 1751 03/04/2013 1549 Full Code [id number]  Zannie Cove, MD Inpatient   08/29/2011 2025 08/31/2011 1754 Full Code [id number]  Ron Parker, MD ED      Questions for Most Recent Historical Code Status (Order [id number])        Home/SNF/Other Home  Chief Complaint Nausea & vomiting [R11.2]  Level of Care/Admitting Diagnosis ED Disposition     ED Disposition  Admit   Condition  --   Comment  Hospital Area: [facility] [[id number]]  Level of Care: Telemetry [5]  Admit to tele based on following criteria: Complex arrhythmia (Bradycardia/Tachycardia)  May place patient in observation at [facility] or [facility] if equivalent level of care is available:: No  Covid Evaluation: Asymptomatic Screening Protocol (No Symptoms)  Diagnosis: Nausea & vomiting [277057]  Admitting Physician: Rometta Emery [[id number]]  Attending Physician: Rometta Emery [[id number]]          Medical History Past Medical History:  Diagnosis Date   Anxiety    Arthritis    Cervical stenosis of spinal canal    Depression    Fibromyalgia    Headache    Hernia    Bilateral Inguinal   Pancreatitis    Syncope    April 2016    Allergies No Active Allergies  IV Location/Drains/Wounds Patient Lines/Drains/Airways Status     Active Line/Drains/Airways     Name Placement date Placement time Site Days   Peripheral IV 11/19/20 22 G Anterior;Right Hand 11/19/20  1550  Hand  less than 1   Incision (Closed) 10/13/14 Lip Other (Comment) 10/13/14  0914   -- 2229            Labs/Imaging Results for orders placed or performed during the hospital encounter of 11/19/20 (from the past 48 hour(s))  CBC with Differential     Status: Abnormal   Collection Time: 11/19/20  1:40 PM  Result Value Ref Range   WBC 10.3 4.0 - 10.5 K/uL   RBC 5.33 4.22 - 5.81 MIL/uL   Hemoglobin 15.8 13.0 - 17.0 g/dL   HCT 89.3 81.0 - 17.5 %   MCV 87.6 80.0 - 100.0 fL   MCH 29.6 26.0 - 34.0 pg   MCHC 33.8 30.0 - 36.0 g/dL   RDW 10.2 58.5 - 27.7 %   Platelets 336 150 - 400 K/uL   nRBC 0.0 0.0 - 0.2 %   Neutrophils Relative % 78 %   Neutro Abs 8.0 (H) 1.7 - 7.7 K/uL   Lymphocytes Relative 16 %   Lymphs Abs 1.7 0.7 - 4.0 K/uL   Monocytes Relative 5 %   Monocytes Absolute 0.5 0.1 - 1.0 K/uL   Eosinophils Relative 0 %   Eosinophils Absolute 0.0 0.0 - 0.5 K/uL   Basophils Relative 1 %   Basophils Absolute 0.1 0.0 - 0.1 K/uL   Immature Granulocytes 0 %   Abs Immature Granulocytes 0.02 0.00 - 0.07 K/uL    Comment: Performed at [facility], [address] [place],  [place] [address]  Comprehensive metabolic panel     Status: Abnormal   Collection Time: 11/19/20  1:40 PM  Result Value Ref Range   Sodium 138 135 - 145 mmol/L   Potassium 3.7 3.5 - 5.1 mmol/L   Chloride 104 98 - 111 mmol/L   CO2 22 22 - 32 mmol/L   Glucose, Bld 112 (H) 70 - 99 mg/dL    Comment: Glucose reference range applies only to samples taken after fasting for at least 8 hours.   BUN 17 6 - 20 mg/dL   Creatinine, Ser 3.241.09 0.61 - 1.24 mg/dL   Calcium 9.9 8.9 - 40.110.3 mg/dL   Total Protein 8.2 (H) 6.5 - 8.1 g/dL   Albumin 4.4 3.5 - 5.0 g/dL   AST 22 15 - 41 U/L   ALT 20 0 - 44 U/L   Alkaline Phosphatase 127 (H) 38 - 126 U/L   Total Bilirubin 0.8 0.3 - 1.2 mg/dL   GFR, Estimated >02>60 >72>60 mL/min    Comment: (NOTE) Calculated using the CKD-EPI Creatinine Equation (2021)    Anion gap 12 5 - 15    Comment: Performed at [facility], [address] [place], [place] [address]  Lipase, blood     Status: None   Collection Time: 11/19/20  1:40 PM  Result Value Ref Range   Lipase 21 11 - 51 U/L    Comment: Performed at [facility], [address] [place], [place] [address]  Troponin I (High Sensitivity)     Status: None   Collection Time: 11/19/20  1:40 PM  Result Value Ref Range   Troponin I (High Sensitivity) 2 <18 ng/L    Comment: (NOTE) Elevated high sensitivity troponin I (hsTnI) values and significant  changes across serial measurements may suggest ACS but many other  chronic and acute conditions are known to elevate hsTnI results.  Refer to the "Links" section for chest pain algorithms and additional  guidance. Performed at [facility], [address] [place], [place] [address]   Resp Panel by RT-PCR (Flu A&B, Covid) Nasopharyngeal Swab     Status: None   Collection Time: 11/19/20  3:35 PM   Specimen: Nasopharyngeal Swab; Nasopharyngeal(NP) swabs in vial transport medium  Result Value Ref Range   SARS Coronavirus 2 by RT PCR NEGATIVE NEGATIVE    Comment: (NOTE) SARS-CoV-2 target nucleic acids are NOT DETECTED.  The SARS-CoV-2 RNA is generally detectable in upper respiratory specimens during the acute phase of infection. The lowest concentration of SARS-CoV-2 viral copies this assay can detect is 138 copies/mL. A negative result does not preclude SARS-Cov-2 infection and should not be used as the sole basis for treatment or other patient management decisions. A negative result may occur with  improper specimen collection/handling, submission of specimen other than nasopharyngeal swab, presence of viral mutation(s) within the areas targeted by this assay, and inadequate number of viral copies(<138 copies/mL). A negative result must be combined with clinical observations, patient history, and epidemiological information. The expected result is Negative.  Fact Sheet for Patients:   BloggerCourse.comhttps://www.fda.gov/media/152166/download  Fact Sheet for Healthcare Providers:  SeriousBroker.ithttps://www.fda.gov/media/152162/download  This test is no t yet approved or cleared by the [place] FDA and  has been authorized for detection and/or diagnosis of SARS-CoV-2 by FDA under an Emergency Use Authorization (EUA). This EUA will remain  in effect (meaning this test can be used) for the duration of the COVID-19 declaration under Section 564(b)(1) of the  Act, 21 U.S.C.section 360bbb-3(b)(1), unless the authorization is terminated  or revoked sooner.       Influenza A by PCR NEGATIVE NEGATIVE   Influenza B by PCR NEGATIVE NEGATIVE    Comment: (NOTE) The Xpert Xpress SARS-CoV-2/FLU/RSV plus assay is intended as an aid in the diagnosis of influenza from Nasopharyngeal swab specimens and should not be used as a sole basis for treatment. Nasal washings and aspirates are unacceptable for Xpert Xpress SARS-CoV-2/FLU/RSV testing.  Fact Sheet for Patients: BloggerCourse.com  Fact Sheet for Healthcare Providers: SeriousBroker.it  This test is not yet approved or cleared by the [place] FDA and has been authorized for detection and/or diagnosis of SARS-CoV-2 by FDA under an Emergency Use Authorization (EUA). This EUA will remain in effect (meaning this test can be used) for the duration of the COVID-19 declaration under Section 564(b)(1) of the Act, 21 U.S.C. section 360bbb-3(b)(1), unless the authorization is terminated or revoked.  Performed at [facility], [address] [place], [place] [address]   Troponin I (High Sensitivity)     Status: None   Collection Time: 11/19/20  3:35 PM  Result Value Ref Range   Troponin I (High Sensitivity) <2 <18 ng/L    Comment: (NOTE) Elevated high sensitivity troponin I (hsTnI) values and significant  changes across serial measurements may suggest ACS but many other  chronic  and acute conditions are known to elevate hsTnI results.  Refer to the "Links" section for chest pain algorithms and additional  guidance. Performed at [facility], [address] [place], [place] [address]   Urinalysis, Routine w reflex microscopic Urine, Clean Catch     Status: Abnormal   Collection Time: 11/19/20  7:30 PM  Result Value Ref Range   Color, Urine YELLOW (A) YELLOW   APPearance HAZY (A) CLEAR   Specific Gravity, Urine 1.025 1.005 - 1.030   pH 6.0 5.0 - 8.0   Glucose, UA NEGATIVE NEGATIVE mg/dL   Hgb urine dipstick NEGATIVE NEGATIVE   Bilirubin Urine MODERATE (A) NEGATIVE   Ketones, ur 40 (A) NEGATIVE mg/dL   Protein, ur NEGATIVE NEGATIVE mg/dL   Nitrite NEGATIVE NEGATIVE   Leukocytes,Ua NEGATIVE NEGATIVE   RBC / HPF 0-5 0 - 5 RBC/hpf   WBC, UA 6-10 0 - 5 WBC/hpf   Bacteria, UA NONE SEEN NONE SEEN   Mucus PRESENT    Hyaline Casts, UA PRESENT     Comment: Performed at [facility], [address], [place], [place] [address]   CT ABDOMEN PELVIS W CONTRAST  Result Date: 11/19/2020 CLINICAL DATA:  Epigastric pain. EXAM: CT ABDOMEN AND PELVIS WITH CONTRAST TECHNIQUE: Multidetector CT imaging of the abdomen and pelvis was performed using the standard protocol following bolus administration of intravenous contrast. CONTRAST:  77mL OMNIPAQUE IOHEXOL 350 MG/ML SOLN COMPARISON:  January 01, 2017 FINDINGS: Lower chest: No acute abnormality. Hepatobiliary: No focal liver abnormality is seen. No gallstones, gallbladder wall thickening, or biliary dilatation. Pancreas: Unremarkable. No pancreatic ductal dilatation or surrounding inflammatory changes. Spleen: Normal in size without focal abnormality. Adrenals/Urinary Tract: Adrenal glands are unremarkable. Kidneys are normal, without renal calculi, focal lesion, or hydronephrosis. Bladder is unremarkable. Stomach/Bowel: Stomach is within normal limits. Appendix appears normal. No evidence of bowel  wall thickening, distention, or inflammatory changes. Vascular/Lymphatic: No significant vascular findings are present. No enlarged abdominal or pelvic lymph nodes. Reproductive: Prostate is unremarkable. Other: No abdominal wall hernia or abnormality. No abdominopelvic ascites. Musculoskeletal: No acute or significant  osseous findings. IMPRESSION: No evidence of acute abnormalities within the abdomen or pelvis. Electronically Signed   By: Ted Mcalpine M.D.   On: 11/19/2020 17:43   DG Chest Portable 1 View  Result Date: 11/19/2020 CLINICAL DATA:  Chest pain and fever. EXAM: PORTABLE CHEST 1 VIEW COMPARISON:  December 31, 2016 FINDINGS: The heart size and mediastinal contours are within normal limits. Both lungs are clear. The visualized skeletal structures are unremarkable. IMPRESSION: No active disease. Electronically Signed   By: Sherian Rein M.D.   On: 11/19/2020 13:57    Pending Labs Unresulted Labs (From admission, onward)     Start     Ordered   11/19/20 2057  Rapid urine drug screen (hospital performed)  ONCE - STAT,   STAT        11/19/20 2056            Vitals/Pain Today's Vitals   11/19/20 1900 11/19/20 1930 11/19/20 2000 11/19/20 2116  BP: (!) 147/82 133/85 129/89   Pulse: 92 71 82   Resp: 18  18   Temp:      TempSrc:      SpO2: 99% 98% 98%   Weight:      Height:      PainSc:    5     Isolation Precautions Airborne and Contact precautions  Medications Medications  lactated ringers bolus 1,000 mL (0 mLs Intravenous Stopped 11/19/20 1913)  morphine 4 MG/ML injection 4 mg (4 mg Intravenous Given 11/19/20 1551)  ondansetron (ZOFRAN) injection 4 mg (4 mg Intravenous Given 11/19/20 1550)  iohexol (OMNIPAQUE) 350 MG/ML injection 80 mL (80 mLs Intravenous Contrast Given 11/19/20 1708)  alum & mag hydroxide-simeth (MAALOX/MYLANTA) 200-200-20 MG/5ML suspension 30 mL (30 mLs Oral Given 11/19/20 1816)    And  lidocaine (XYLOCAINE) 2 % viscous mouth solution 15 mL (15 mLs  Oral Given 11/19/20 1815)  famotidine (PEPCID) IVPB 20 mg premix (0 mg Intravenous Stopped 11/19/20 1913)  droperidol (INAPSINE) 2.5 MG/ML injection 1.25 mg (1.25 mg Intravenous Given 11/19/20 1922)  diphenhydrAMINE (BENADRYL) injection 25 mg (25 mg Intravenous Given 11/19/20 1923)  morphine 4 MG/ML injection 4 mg (4 mg Intravenous Given 11/19/20 2048)    Mobility walks with device

## 2020-11-19 NOTE — ED Provider Notes (Signed)
Emergency Medicine Provider Triage Evaluation Note  Brent Morales , a 45 y.o. male  was evaluated in triage.  Pt complains of nausea, vomiting, diarrhea associated with generalized abdominal pain x4 days.  Patient has had 2 previous hernia repairs.  He also endorses chest pain, cough, and fatigue.  No sick contacts or known COVID exposures.  Review of Systems  Positive: N/V/D Negative:   Physical Exam  BP (!) 128/93   Pulse 77   Temp 98.5 F (36.9 C) (Oral)   Resp 18   Ht 6' (1.829 m)   Wt 71 kg   SpO2 98%   BMI 21.23 kg/m  Gen:   Awake, no distress   Resp:  Normal effort  MSK:   Moves extremities without difficulty  Other:  Diffuse abdominal tenderness  Medical Decision Making  Medically screening exam initiated at 12:36 PM.  Appropriate orders placed.  Brent Libman Abrigo Jr. was informed that the remainder of the evaluation will be completed by another provider, this initial triage assessment does not replace that evaluation, and the importance of remaining in the ED until their evaluation is complete.  Abdominal labs Cardiac labs EKG CXR COVID test   Jesusita Oka 11/19/20 1238    Ernie Avena, MD 11/19/20 1433

## 2020-11-20 ENCOUNTER — Encounter (HOSPITAL_COMMUNITY): Payer: Self-pay | Admitting: Internal Medicine

## 2020-11-20 DIAGNOSIS — R112 Nausea with vomiting, unspecified: Secondary | ICD-10-CM | POA: Diagnosis present

## 2020-11-20 DIAGNOSIS — F32A Depression, unspecified: Secondary | ICD-10-CM | POA: Diagnosis present

## 2020-11-20 DIAGNOSIS — R109 Unspecified abdominal pain: Secondary | ICD-10-CM

## 2020-11-20 DIAGNOSIS — R251 Tremor, unspecified: Secondary | ICD-10-CM | POA: Diagnosis present

## 2020-11-20 DIAGNOSIS — F17210 Nicotine dependence, cigarettes, uncomplicated: Secondary | ICD-10-CM | POA: Diagnosis present

## 2020-11-20 DIAGNOSIS — G894 Chronic pain syndrome: Secondary | ICD-10-CM | POA: Diagnosis present

## 2020-11-20 DIAGNOSIS — R748 Abnormal levels of other serum enzymes: Secondary | ICD-10-CM | POA: Diagnosis present

## 2020-11-20 DIAGNOSIS — E876 Hypokalemia: Secondary | ICD-10-CM | POA: Diagnosis present

## 2020-11-20 DIAGNOSIS — Z79899 Other long term (current) drug therapy: Secondary | ICD-10-CM | POA: Diagnosis not present

## 2020-11-20 DIAGNOSIS — Z79891 Long term (current) use of opiate analgesic: Secondary | ICD-10-CM | POA: Diagnosis not present

## 2020-11-20 DIAGNOSIS — M1990 Unspecified osteoarthritis, unspecified site: Secondary | ICD-10-CM | POA: Diagnosis present

## 2020-11-20 DIAGNOSIS — F411 Generalized anxiety disorder: Secondary | ICD-10-CM | POA: Diagnosis present

## 2020-11-20 DIAGNOSIS — R197 Diarrhea, unspecified: Secondary | ICD-10-CM | POA: Diagnosis present

## 2020-11-20 DIAGNOSIS — Z20822 Contact with and (suspected) exposure to covid-19: Secondary | ICD-10-CM | POA: Diagnosis present

## 2020-11-20 DIAGNOSIS — Z800 Family history of malignant neoplasm of digestive organs: Secondary | ICD-10-CM | POA: Diagnosis not present

## 2020-11-20 DIAGNOSIS — K219 Gastro-esophageal reflux disease without esophagitis: Secondary | ICD-10-CM | POA: Diagnosis present

## 2020-11-20 DIAGNOSIS — F1210 Cannabis abuse, uncomplicated: Secondary | ICD-10-CM | POA: Diagnosis present

## 2020-11-20 DIAGNOSIS — Z8249 Family history of ischemic heart disease and other diseases of the circulatory system: Secondary | ICD-10-CM | POA: Diagnosis not present

## 2020-11-20 DIAGNOSIS — M797 Fibromyalgia: Secondary | ICD-10-CM | POA: Diagnosis present

## 2020-11-20 LAB — COMPREHENSIVE METABOLIC PANEL
ALT: 17 U/L (ref 0–44)
AST: 17 U/L (ref 15–41)
Albumin: 4.2 g/dL (ref 3.5–5.0)
Alkaline Phosphatase: 110 U/L (ref 38–126)
Anion gap: 12 (ref 5–15)
BUN: 15 mg/dL (ref 6–20)
CO2: 24 mmol/L (ref 22–32)
Calcium: 9.6 mg/dL (ref 8.9–10.3)
Chloride: 106 mmol/L (ref 98–111)
Creatinine, Ser: 0.92 mg/dL (ref 0.61–1.24)
GFR, Estimated: >60 mL/min (ref >60)
Glucose, Bld: 125 mg/dL — ABNORMAL HIGH (ref 70–99)
Potassium: 3.6 mmol/L (ref 3.5–5.1)
Sodium: 142 mmol/L (ref 135–145)
Total Bilirubin: 0.6 mg/dL (ref 0.3–1.2)
Total Protein: 7.7 g/dL (ref 6.5–8.1)

## 2020-11-20 LAB — CBC
HCT: 42.1 % (ref 39.0–52.0)
Hemoglobin: 14.5 g/dL (ref 13.0–17.0)
MCH: 30 pg (ref 26.0–34.0)
MCHC: 34.4 g/dL (ref 30.0–36.0)
MCV: 87 fL (ref 80.0–100.0)
Platelets: 290 10*3/uL (ref 150–400)
RBC: 4.84 MIL/uL (ref 4.22–5.81)
RDW: 12.1 % (ref 11.5–15.5)
WBC: 9 10*3/uL (ref 4.0–10.5)
nRBC: 0 % (ref 0.0–0.2)

## 2020-11-20 LAB — HIV ANTIBODY (ROUTINE TESTING W REFLEX): HIV Screen 4th Generation wRfx: NONREACTIVE

## 2020-11-20 MED ORDER — TRAMADOL HCL 50 MG PO TABS
50.0000 mg | ORAL_TABLET | Freq: Once | ORAL | Status: AC
  Administered 2020-11-20: 50 mg via ORAL
  Filled 2020-11-20: qty 1

## 2020-11-20 MED ORDER — OXYCODONE HCL 5 MG PO TABS: 15.0000 mg | ORAL_TABLET | Freq: Two times a day (BID) | ORAL | Status: DC

## 2020-11-20 MED ORDER — SUCRALFATE 1 G PO TABS
1.0000 g | ORAL_TABLET | Freq: Three times a day (TID) | ORAL | Status: DC
  Administered 2020-11-20 – 2020-11-22 (×7): 1 g via ORAL
  Filled 2020-11-20 (×7): qty 1

## 2020-11-20 MED ORDER — ACETAMINOPHEN 325 MG PO TABS
650.0000 mg | ORAL_TABLET | Freq: Four times a day (QID) | ORAL | Status: DC | PRN
  Administered 2020-11-21: 650 mg via ORAL
  Filled 2020-11-20: qty 2

## 2020-11-20 MED ORDER — OXYCODONE HCL 5 MG PO TABS
15.0000 mg | ORAL_TABLET | Freq: Two times a day (BID) | ORAL | Status: DC
  Administered 2020-11-20 – 2020-11-22 (×4): 15 mg via ORAL
  Filled 2020-11-20 (×4): qty 3

## 2020-11-20 MED ORDER — OXYCODONE HCL 5 MG PO TABS
15.0000 mg | ORAL_TABLET | Freq: Once | ORAL | Status: AC
  Administered 2020-11-20: 15 mg via ORAL
  Filled 2020-11-20: qty 3

## 2020-11-20 MED ORDER — GABAPENTIN 300 MG PO CAPS
900.0000 mg | ORAL_CAPSULE | Freq: Three times a day (TID) | ORAL | Status: DC
  Administered 2020-11-20 (×2): 900 mg via ORAL
  Filled 2020-11-20 (×2): qty 3

## 2020-11-20 MED ORDER — POTASSIUM CHLORIDE CRYS ER 20 MEQ PO TBCR
40.0000 meq | EXTENDED_RELEASE_TABLET | Freq: Once | ORAL | Status: AC
  Administered 2020-11-20: 40 meq via ORAL
  Filled 2020-11-20: qty 2

## 2020-11-20 MED ORDER — MIRTAZAPINE 30 MG PO TBDP
30.0000 mg | ORAL_TABLET | Freq: Every day | ORAL | Status: DC
  Administered 2020-11-20 – 2020-11-21 (×2): 30 mg via ORAL
  Filled 2020-11-20 (×2): qty 1

## 2020-11-20 MED ORDER — HYDROXYZINE HCL 25 MG PO TABS
25.0000 mg | ORAL_TABLET | Freq: Every day | ORAL | Status: DC
  Administered 2020-11-20 – 2020-11-21 (×2): 25 mg via ORAL
  Filled 2020-11-20 (×2): qty 1

## 2020-11-20 MED ORDER — IBUPROFEN 200 MG PO TABS: 400.0000 mg | ORAL_TABLET | Freq: Three times a day (TID) | ORAL | Status: DC | PRN

## 2020-11-20 MED ORDER — DULOXETINE HCL 30 MG PO CPEP
30.0000 mg | ORAL_CAPSULE | Freq: Every day | ORAL | Status: DC
  Administered 2020-11-20 – 2020-11-22 (×3): 30 mg via ORAL
  Filled 2020-11-20 (×3): qty 1

## 2020-11-20 NOTE — Progress Notes (Addendum)
PROGRESS NOTE    Brent Morales Brent Morales.  CVE:[id number] DOB: [birth year] DOA: 11/19/2020 PCP: Floreen Comber, MD    Brief Narrative:  [name] was admitted to the hospital with the working diagnosis of intractable nausea and vomiting in the setting of tetrahydrocannabinol use.    45 year old male with past medical history for chronic pain syndrome, fibromyalgia, osteoarthritis, anxiety, cervical stenosis, history of pancreatitis and depression who presented with nausea and vomiting.  Reported 4 days of generalized abdominal pain, associated with nausea and vomiting, not tolerating p.o. diet.  On his initial physical examination temperature 98.5, blood pressure 140/96, heart rate 92, respiratory rate 20, oxygen saturation 95% on room air.  He had dry mucous membranes, his lungs were clear to auscultation bilaterally, heart S1-S2, present, rhythmic, his abdomen had diffuse tenderness, no lower extremity edema.   Sodium 138, potassium 3.7, chloride 104, bicarb 22, glucose 112, BUN 17, creatinine 1.0, white count 10.3, hemoglobin 15.8, hematocrit 46.7, platelets 336. SARS COVID-19 negative.   Urinalysis specific gravity 1.025, 6-10 white cells. Toxicology screen positive for benzodiazepines, opioids and tetrahydrocannabinol.  Alcohol less than 5.   CT of the abdomen and pelvis no acute changes.   EKG 110 bpm, normal axis, normal intervals, sinus rhythm, no significant ST segment or T wave changes, noisy baseline.   Assessment & Plan:   Principal Problem:   Nausea & vomiting Active Problems:   Anxiety state   History of drug dependence (HCC)   Tobacco abuse disorder   Abdominal pain   Intractable nausea and vomiting in the setting of tetrahydrocannabinol use. This am reports improvement of nausea and vomiting, but not yet back to baseline. He has been on a clear liquid diet.   Plan to continue supportive medical therapy with IV fluids and as needed antiemetics.  Continue with antiacid  therapy.  Advance diet to soft as tolerated.   2. Hypokalemia. Renal function stable with serum cr at 0,92, K is 3,6 and serum bicarbonate at 24. Plan to add 40 meq Kc and follow up renal function and electrolytes in am, including Mg.   3. Chronic pain syndrome and anxiety disorder. Resume patient's home regimen of duloxetine, oxycodone, ibuprofen, mirtazapine and gabapentin.  Continue with hydroxyzine.   Patient continue to be at high risk for worsening nausea and vomiting.   Status is: Observation  The patient will require care spanning > 2 midnights and should be moved to inpatient because: IV treatments appropriate due to intensity of illness or inability to take PO  Dispo: The patient is from: Home              Anticipated d/c is to: Home              Patient currently is not medically stable to d/c.   Difficult to place patient No  DVT prophylaxis: Enoxaparin   Code Status:    full  Family Communication:   No family at the bedside     Subjective: Patient with improvement in nausea and vomiting, but not yet back to baseline, he has been only on liquids. No dyspnea or chest pain.   Objective: Vitals:   11/19/20 2000 11/19/20 2116 11/20/20 0137 11/20/20 0552  BP: 129/89 124/78 125/67 (!) 153/97  Pulse: 82 88 78 69  Resp: 18 19 20 20   Temp:  98.2 F (36.8 C) 98.7 F (37.1 C) 98.8 F (37.1 C)  TempSrc:  Oral Oral Oral  SpO2: 98% 99% 100% 99%  Weight:  Height:        Intake/Output Summary (Last 24 hours) at 11/20/2020 1316 Last data filed at 11/20/2020 4174 Gross per 24 hour  Intake 834.29 ml  Output --  Net 834.29 ml   Filed Weights   11/19/20 1216  Weight: 71 kg    Examination:   General: Not in pain or dyspnea, deconditioned  Neurology: Awake and alert, non focal  E ENT: mild pallor, no icterus, oral mucosa moist Cardiovascular: No JVD. S1-S2 present, rhythmic, no gallops, rubs, or murmurs. No lower extremity edema. Pulmonary: positive breath sounds  bilaterally, adequate air movement, no wheezing, rhonchi or rales. Gastrointestinal. Abdomen soft and mild tender to superficial palpation Skin. Right forearm with edema and tenderness (infiltrated IV).  Musculoskeletal: no joint deformities     Data Reviewed: I have personally reviewed following labs and imaging studies  CBC: Recent Labs  Lab 11/19/20 1340 11/20/20 0506  WBC 10.3 9.0  NEUTROABS 8.0*  --   HGB 15.8 14.5  HCT 46.7 42.1  MCV 87.6 87.0  PLT 336 290   Basic Metabolic Panel: Recent Labs  Lab 11/19/20 1340 11/20/20 0506  NA 138 142  K 3.7 3.6  CL 104 106  CO2 22 24  GLUCOSE 112* 125*  BUN 17 15  CREATININE 1.09 0.92  CALCIUM 9.9 9.6   GFR: Estimated Creatinine Clearance: 102.9 mL/min (by C-G formula based on SCr of 0.92 mg/dL). Liver Function Tests: Recent Labs  Lab 11/19/20 1340 11/20/20 0506  AST 22 17  ALT 20 17  ALKPHOS 127* 110  BILITOT 0.8 0.6  PROT 8.2* 7.7  ALBUMIN 4.4 4.2   Recent Labs  Lab 11/19/20 1340  LIPASE 21   No results for input(s): AMMONIA in the last 168 hours. Coagulation Profile: No results for input(s): INR, PROTIME in the last 168 hours. Cardiac Enzymes: No results for input(s): CKTOTAL, CKMB, CKMBINDEX, TROPONINI in the last 168 hours. BNP (last 3 results) No results for input(s): PROBNP in the last 8760 hours. HbA1C: No results for input(s): HGBA1C in the last 72 hours. CBG: No results for input(s): GLUCAP in the last 168 hours. Lipid Profile: No results for input(s): CHOL, HDL, LDLCALC, TRIG, CHOLHDL, LDLDIRECT in the last 72 hours. Thyroid Function Tests: No results for input(s): TSH, T4TOTAL, FREET4, T3FREE, THYROIDAB in the last 72 hours. Anemia Panel: No results for input(s): VITAMINB12, FOLATE, FERRITIN, TIBC, IRON, RETICCTPCT in the last 72 hours.    Radiology Studies: I have reviewed all of the imaging during this hospital visit personally     Scheduled Meds:  DULoxetine  30 mg Oral Daily    enoxaparin (LOVENOX) injection  40 mg Subcutaneous Q24H   gabapentin  900 mg Oral TID   hydrOXYzine  25 mg Oral QHS   mirtazapine  30 mg Oral QHS   nicotine  21 mg Transdermal Daily   oxyCODONE  15 mg Oral BID   pantoprazole  40 mg Oral Daily   potassium chloride  40 mEq Oral Once   sucralfate  1 g Oral TID WC & HS   Continuous Infusions:  dextrose 5 % and 0.9% NaCl Stopped (11/20/20 0814)     LOS: 0 days        Dymond Spreen Annett Gula, MD

## 2020-11-21 LAB — BASIC METABOLIC PANEL
Anion gap: 6 (ref 5–15)
BUN: 16 mg/dL (ref 6–20)
CO2: 23 mmol/L (ref 22–32)
Calcium: 8.9 mg/dL (ref 8.9–10.3)
Chloride: 109 mmol/L (ref 98–111)
Creatinine, Ser: 0.76 mg/dL (ref 0.61–1.24)
GFR, Estimated: >60 mL/min (ref >60)
Glucose, Bld: 112 mg/dL — ABNORMAL HIGH (ref 70–99)
Potassium: 3.6 mmol/L (ref 3.5–5.1)
Sodium: 138 mmol/L (ref 135–145)

## 2020-11-21 LAB — GLUCOSE, CAPILLARY: Glucose-Capillary: 108 mg/dL — ABNORMAL HIGH (ref 70–99)

## 2020-11-21 LAB — MAGNESIUM: Magnesium: 2.1 mg/dL (ref 1.7–2.4)

## 2020-11-21 MED ORDER — ONDANSETRON HCL 4 MG PO TABS: 4.0000 mg | ORAL_TABLET | ORAL | Status: DC | PRN

## 2020-11-21 MED ORDER — CLONAZEPAM 1 MG PO TABS: 2.0000 mg | ORAL_TABLET | Freq: Two times a day (BID) | ORAL | Status: DC

## 2020-11-21 MED ORDER — POTASSIUM CHLORIDE CRYS ER 20 MEQ PO TBCR
40.0000 meq | EXTENDED_RELEASE_TABLET | Freq: Once | ORAL | Status: AC
  Administered 2020-11-21: 40 meq via ORAL
  Filled 2020-11-21: qty 2

## 2020-11-21 MED ORDER — MORPHINE SULFATE (PF) 2 MG/ML IV SOLN
2.0000 mg | Freq: Four times a day (QID) | INTRAVENOUS | Status: DC | PRN
  Administered 2020-11-21 – 2020-11-22 (×2): 2 mg via INTRAVENOUS
  Filled 2020-11-21 (×2): qty 1

## 2020-11-21 MED ORDER — ONDANSETRON HCL 4 MG/2ML IJ SOLN
4.0000 mg | Freq: Four times a day (QID) | INTRAMUSCULAR | Status: DC | PRN
  Administered 2020-11-21: 4 mg via INTRAVENOUS
  Filled 2020-11-21: qty 2

## 2020-11-21 MED ORDER — CLONAZEPAM 1 MG PO TABS
1.0000 mg | ORAL_TABLET | Freq: Every day | ORAL | Status: DC
  Administered 2020-11-21: 1 mg via ORAL
  Filled 2020-11-21: qty 1

## 2020-11-21 MED ORDER — CLONAZEPAM 1 MG PO TABS
1.0000 mg | ORAL_TABLET | Freq: Two times a day (BID) | ORAL | Status: DC
  Administered 2020-11-21 – 2020-11-22 (×2): 1 mg via ORAL
  Filled 2020-11-21 (×2): qty 1

## 2020-11-21 MED ORDER — SODIUM CHLORIDE 0.9 % IV SOLN
25.0000 mg | Freq: Four times a day (QID) | INTRAVENOUS | Status: DC | PRN
  Filled 2020-11-21 (×2): qty 1

## 2020-11-21 MED ORDER — DEXTROSE IN LACTATED RINGERS 5 % IV SOLN: INTRAVENOUS | Status: DC

## 2020-11-21 MED ORDER — GABAPENTIN 250 MG/5ML PO SOLN
900.0000 mg | Freq: Three times a day (TID) | ORAL | Status: DC
  Administered 2020-11-21 – 2020-11-22 (×3): 900 mg via ORAL
  Filled 2020-11-21 (×4): qty 18

## 2020-11-21 NOTE — Progress Notes (Signed)
Pt c/o double vision, RN to room. At 1838 Pt says his head feels full and when he closes his eyes he feels like he's going backward. RN did NIHSS. Pt had LUE drift and said the left side of his face felt tingly compared to r die. RN paged RR. RR to room at 1852. MD has been paged. RN in pt room as well as Rapid Response RN. VS, EKG and BG done. BG 108 and EJG NSR w/ RBBB. VS normal except for rectal temp 100.0 and BP slightly elevated

## 2020-11-21 NOTE — Significant Event (Signed)
Rapid Response Event Note   Reason for Call :   Double Vision    Initial Focused Assessment:  Patient alert and oriented x4, VSS see flow sheet, patient reports that he is seeing double  No drift noted to LUE at this time and patient denies tingling to left side of face. Completed NIH scale see flow sheet   Interventions:  NIH scale  Notified Dr. Ella Jubilee    Plan of Care:  Continue to monitor  Monitor for withdrawal     MD Notified: Dr Ella Jubilee Call Time: 1820 Arrival Time: 1800 End Time: 1830   Sharyn Lull Eythan Jayne, RN

## 2020-11-21 NOTE — Progress Notes (Signed)
PROGRESS NOTE    Brent Morales.  DJM:[id number] DOB: [birth year] DOA: 11/19/2020 PCP: Floreen Comber, MD    Brief Narrative:  [name] was admitted to the hospital with the working diagnosis of intractable nausea and vomiting in the setting of tetrahydrocannabinol use.    45 year old male with past medical history for chronic pain syndrome, fibromyalgia, osteoarthritis, anxiety, cervical stenosis, history of pancreatitis and depression who presented with nausea and vomiting.  Reported 4 days of generalized abdominal pain, associated with nausea and vomiting, not tolerating p.o. diet.  On his initial physical examination temperature 98.5, blood pressure 140/96, heart rate 92, respiratory rate 20, oxygen saturation 95% on room air.  He had dry mucous membranes, his lungs were clear to auscultation bilaterally, heart S1-S2, present, rhythmic, his abdomen had diffuse tenderness, no lower extremity edema.   Sodium 138, potassium 3.7, chloride 104, bicarb 22, glucose 112, BUN 17, creatinine 1.0, white count 10.3, hemoglobin 15.8, hematocrit 46.7, platelets 336. SARS COVID-19 negative.   Urinalysis specific gravity 1.025, 6-10 white cells. Toxicology screen positive for benzodiazepines, opioids and tetrahydrocannabinol.  Alcohol less than 5.   CT of the abdomen and pelvis no acute changes.   EKG 110 bpm, normal axis, normal intervals, sinus rhythm, no significant ST segment or T wave changes, noisy baseline.  Patient was place on supportive IV fluids and as needed antiemetics.  Slowly improving but not yet back to baseline.   Assessment & Plan:   Principal Problem:   Nausea & vomiting Active Problems:   Anxiety state   History of drug dependence (HCC)   Tobacco abuse disorder   Abdominal pain   Intractable nausea and vomiting     Intractable nausea and vomiting in the setting of tetrahydrocannabinol use. Patient continue to have intermittent nausea not back to baseline yet.     Plan to continue with as needed antiemetics (zofran and phenergan) and scheduled antiacids, pantoprazole and sucralfate.  Trail to advance diet to regular, if good toleration possible dc home 11/22/20.  Continue to encourage to get out of bed to chair tid and ambulate in the hallway.    Ok to discontinue telemetry.    2. Hypokalemia.  Persistent hypokalemia with K 3,6, renal function with serum cr at 0.76, Mg is 2,1.  Plan to add 40 meq Kcl and follow up renal function in am, Continue volume repletion with D5 LR at 50 ml per HR.    3. Chronic pain syndrome and anxiety disorder.  Continue with duloxetine, oxycodone, ibuprofen, mirtazapine and gabapentin.  On hydroxyzine.   This am with some tremors, patient at home on clonazepam bid 2 mg, will resume clonazepam at 1 mg bid for now to prevent withdrawals.    Status is: Inpatient  Remains inpatient appropriate because:IV treatments appropriate due to intensity of illness or inability to take PO  Dispo: The patient is from: Home              Anticipated d/c is to: Home possible dc home on 11/22/20              Patient currently is not medically stable to d/c.   Difficult to place patient No   DVT prophylaxis: Enoxaparin   Code Status:    full  Family Communication:  No family at the bedside      Subjective: Patient with still occasional nausea but not vomiting, no chest pain or dyspnea. Positive tremors.   Objective: Vitals:   11/20/20 0552 11/20/20 1818 11/20/20  2134 11/21/20 0515  BP: (!) 153/97 136/67 138/75 (!) 152/86  Pulse: 69 73 64 63  Resp: 20 16 16 16   Temp: 98.8 F (37.1 C) 98.8 F (37.1 C) 98.7 F (37.1 C) 98.3 F (36.8 C)  TempSrc: Oral Oral Oral Oral  SpO2: 99% 95% 97% 98%  Weight:      Height:        Intake/Output Summary (Last 24 hours) at 11/21/2020 1209 Last data filed at 11/21/2020 0534 Gross per 24 hour  Intake 1073.21 ml  Output --  Net 1073.21 ml   Filed Weights   11/19/20 1216  Weight:  71 kg    Examination:   General: Not in pain or dyspnea  Neurology: Awake and alert, no tremors at the time of my examination E ENT: mild pallor, no icterus, oral mucosa moist Cardiovascular: No JVD. S1-S2 present, rhythmic, no gallops, rubs, or murmurs. No lower extremity edema. Right upper extremity improving edema, (infiltrated IV)  Pulmonary: positive breath sounds bilaterally, adequate air movement, no wheezing, rhonchi or rales. Gastrointestinal. Abdomen soft and non tender Skin. No rashes Musculoskeletal: no joint deformities     Data Reviewed: I have personally reviewed following labs and imaging studies  CBC: Recent Labs  Lab 11/19/20 1340 11/20/20 0506  WBC 10.3 9.0  NEUTROABS 8.0*  --   HGB 15.8 14.5  HCT 46.7 42.1  MCV 87.6 87.0  PLT 336 290   Basic Metabolic Panel: Recent Labs  Lab 11/19/20 1340 11/20/20 0506 11/21/20 0434  NA 138 142 138  K 3.7 3.6 3.6  CL 104 106 109  CO2 22 24 23   GLUCOSE 112* 125* 112*  BUN 17 15 16   CREATININE 1.09 0.92 0.76  CALCIUM 9.9 9.6 8.9  MG  --   --  2.1   GFR: Estimated Creatinine Clearance: 118.3 mL/min (by C-G formula based on SCr of 0.76 mg/dL). Liver Function Tests: Recent Labs  Lab 11/19/20 1340 11/20/20 0506  AST 22 17  ALT 20 17  ALKPHOS 127* 110  BILITOT 0.8 0.6  PROT 8.2* 7.7  ALBUMIN 4.4 4.2   Recent Labs  Lab 11/19/20 1340  LIPASE 21   No results for input(s): AMMONIA in the last 168 hours. Coagulation Profile: No results for input(s): INR, PROTIME in the last 168 hours. Cardiac Enzymes: No results for input(s): CKTOTAL, CKMB, CKMBINDEX, TROPONINI in the last 168 hours. BNP (last 3 results) No results for input(s): PROBNP in the last 8760 hours. HbA1C: No results for input(s): HGBA1C in the last 72 hours. CBG: No results for input(s): GLUCAP in the last 168 hours. Lipid Profile: No results for input(s): CHOL, HDL, LDLCALC, TRIG, CHOLHDL, LDLDIRECT in the last 72 hours. Thyroid  Function Tests: No results for input(s): TSH, T4TOTAL, FREET4, T3FREE, THYROIDAB in the last 72 hours. Anemia Panel: No results for input(s): VITAMINB12, FOLATE, FERRITIN, TIBC, IRON, RETICCTPCT in the last 72 hours.    Radiology Studies: I have reviewed all of the imaging during this hospital visit personally     Scheduled Meds:  clonazePAM  1 mg Oral Daily   DULoxetine  30 mg Oral Daily   enoxaparin (LOVENOX) injection  40 mg Subcutaneous Q24H   gabapentin  900 mg Oral Q8H   hydrOXYzine  25 mg Oral QHS   mirtazapine  30 mg Oral QHS   nicotine  21 mg Transdermal Daily   oxyCODONE  15 mg Oral BID   pantoprazole  40 mg Oral Daily   sucralfate  1  g Oral TID WC & HS   Continuous Infusions:  dextrose 5% lactated ringers 75 mL/hr at 11/21/20 1149   promethazine (PHENERGAN) injection (IM or IVPB)       LOS: 1 day        Novie Maggio Annett Gula, MD

## 2020-11-22 LAB — BASIC METABOLIC PANEL
Anion gap: 7 (ref 5–15)
BUN: 14 mg/dL (ref 6–20)
CO2: 26 mmol/L (ref 22–32)
Calcium: 9.5 mg/dL (ref 8.9–10.3)
Chloride: 114 mmol/L — ABNORMAL HIGH (ref 98–111)
Creatinine, Ser: 0.84 mg/dL (ref 0.61–1.24)
GFR, Estimated: >60 mL/min (ref >60)
Glucose, Bld: 104 mg/dL — ABNORMAL HIGH (ref 70–99)
Potassium: 3.9 mmol/L (ref 3.5–5.1)
Sodium: 147 mmol/L — ABNORMAL HIGH (ref 135–145)

## 2020-11-22 MED ORDER — ONDANSETRON HCL 4 MG PO TABS: 4.0000 mg | ORAL_TABLET | ORAL | 0 refills | Status: AC | PRN

## 2020-11-22 MED ORDER — ONDANSETRON HCL 4 MG PO TABS: 4.0000 mg | ORAL_TABLET | ORAL | 0 refills | Status: DC | PRN

## 2020-11-22 NOTE — Plan of Care (Signed)

## 2020-11-22 NOTE — TOC Progression Note (Signed)
Transition of Care (TOC) - Progression Note    Patient Details  Name: Brent Morales. MRN: [id number] Date of Birth: Dec 21, 1975  Transition of Care [facility]) CM/SW Contact  Arelia Volpe, Meriam Sprague, RN Phone Number: 11/22/2020, 9:33 AM  Clinical Narrative:    Coupon for Zofran provided to pt at dc.  Readmission Risk Interventions No flowsheet data found.

## 2020-11-22 NOTE — Discharge Summary (Signed)
Physician Discharge Summary  Brent Morales. MVE:[id number] DOB: [birth year] DOA: 11/19/2020  PCP: Floreen Comber, MD  Admit date: 11/19/2020 Discharge date: 11/22/2020  Admitted From: home Disposition:  home  Recommendations for Outpatient Follow-up:  Follow up with PCP in 1-2 weeks Please obtain BMP/CBC in one week  Home Health: none Equipment/Devices: none  Discharge Condition: stable CODE STATUS: Full code Diet recommendation: regular  HPI: Per admitting MD, Brent Bullocks. is a 45 y.o. male with medical history significant of chronic pain syndrome, fibromyalgia, osteoarthritis, depression with anxiety, cervical stenosis, prior pancreatitis and syncope who presented to the ER with intractable nausea with vomiting.  Also having generalized abdominal pain for the last 4 days.  He did have some diarrhea associated with it.  Denied any sick contact.  Denied any hematemesis or melena.  Denied any bright red blood per rectum.  Patient's pain is rated as 6 out of 10 colicky in nature.  No relationship to food.  He has been unable to keep anything down.  In the ER patient was treated empirically with combination of Zofran, droperidol as well as Phenergan but no relief.  He is therefore being admitted for observation and management of intractable nausea vomiting.  So far initial evaluation including CT Abdo pelvis is nonrevealing.  Suspected acute gastritis.  He does have history of GERD and takes PPIs.  Hospital Course / Discharge diagnoses: Principal problem Intractable nausea and vomiting in the setting of tetrahydrocannabinol use -he was admitted to the hospital with intractable nausea and vomiting likely in the setting of THC use. CT scan abdomen pelvis without acute findings. He was treated conservatively with resolution of his symptoms, able to tolerate a regular diet and clinically he has returned to baseline. He will be discharged home in stable condition with outpatient follow  up   Active problems Hypokalemia -repleted Chronic pain syndrome and anxiety disorder -Continue home regimeh   Sepsis ruled out   Discharge Instructions  Discharge Instructions     Diet - low sodium heart healthy   Complete by: As directed    Discharge instructions   Complete by: As directed    Please follow up with primary care in 7 to 10 days.   Increase activity slowly   Complete by: As directed       Allergies as of 11/22/2020   No Known Allergies      Medication List     STOP taking these medications    ranitidine 150 MG tablet Commonly known as: ZANTAC   sucralfate 1 GM/10ML suspension Commonly known as: Carafate       TAKE these medications    DULoxetine 30 MG capsule Commonly known as: CYMBALTA Take 30 mg by mouth daily.   gabapentin 300 MG capsule Commonly known as: NEURONTIN Take 900 mg by mouth 3 (three) times daily.   hydrOXYzine 25 MG tablet Commonly known as: ATARAX/VISTARIL Take 25 mg by mouth at bedtime.   ibuprofen 200 MG tablet Commonly known as: ADVIL Take 4 tablets (800 mg total) every 8 (eight) hours as needed by mouth for moderate pain. Reported on 09/01/2015 What changed:  how much to take additional instructions   mirtazapine 30 MG disintegrating tablet Commonly known as: REMERON SOL-TAB Take 30 mg by mouth at bedtime.   omeprazole 20 MG capsule Commonly known as: PRILOSEC Take 20 mg by mouth daily.   ondansetron 4 MG tablet Commonly known as: ZOFRAN Take 1 tablet (4 mg total) by mouth every  4 (four) hours as needed for nausea.   oxyCODONE 15 MG immediate release tablet Commonly known as: ROXICODONE Take 15 mg by mouth 2 (two) times daily.         Consultations: None   Procedures/Studies: none  CT ABDOMEN PELVIS W CONTRAST  Result Date: 11/19/2020 CLINICAL DATA:  Epigastric pain. EXAM: CT ABDOMEN AND PELVIS WITH CONTRAST TECHNIQUE: Multidetector CT imaging of the abdomen and pelvis was performed using  the standard protocol following bolus administration of intravenous contrast. CONTRAST:  49mL OMNIPAQUE IOHEXOL 350 MG/ML SOLN COMPARISON:  January 01, 2017 FINDINGS: Lower chest: No acute abnormality. Hepatobiliary: No focal liver abnormality is seen. No gallstones, gallbladder wall thickening, or biliary dilatation. Pancreas: Unremarkable. No pancreatic ductal dilatation or surrounding inflammatory changes. Spleen: Normal in size without focal abnormality. Adrenals/Urinary Tract: Adrenal glands are unremarkable. Kidneys are normal, without renal calculi, focal lesion, or hydronephrosis. Bladder is unremarkable. Stomach/Bowel: Stomach is within normal limits. Appendix appears normal. No evidence of bowel wall thickening, distention, or inflammatory changes. Vascular/Lymphatic: No significant vascular findings are present. No enlarged abdominal or pelvic lymph nodes. Reproductive: Prostate is unremarkable. Other: No abdominal wall hernia or abnormality. No abdominopelvic ascites. Musculoskeletal: No acute or significant osseous findings. IMPRESSION: No evidence of acute abnormalities within the abdomen or pelvis. Electronically Signed   By: Ted Mcalpine M.D.   On: 11/19/2020 17:43   DG Chest Portable 1 View  Result Date: 11/19/2020 CLINICAL DATA:  Chest pain and fever. EXAM: PORTABLE CHEST 1 VIEW COMPARISON:  December 31, 2016 FINDINGS: The heart size and mediastinal contours are within normal limits. Both lungs are clear. The visualized skeletal structures are unremarkable. IMPRESSION: No active disease. Electronically Signed   By: Sherian Rein M.D.   On: 11/19/2020 13:57     Subjective: - no chest pain, shortness of breath, no abdominal pain, nausea or vomiting.   Discharge Exam: BP 127/67 (BP Location: Left Arm)   Pulse 65   Temp 98.3 F (36.8 C) (Oral)   Resp 14   Ht 6' (1.829 m)   Wt 71 kg   SpO2 96%   BMI 21.23 kg/m   General: Pt is alert, awake, not in acute  distress Cardiovascular: RRR, S1/S2 +, no rubs, no gallops Respiratory: CTA bilaterally, no wheezing, no rhonchi Abdominal: Soft, NT, ND, bowel sounds + Extremities: no edema, no cyanosis    The results of significant diagnostics from this hospitalization (including imaging, microbiology, ancillary and laboratory) are listed below for reference.     Microbiology: Recent Results (from the past 240 hour(s))  Resp Panel by RT-PCR (Flu A&B, Covid) Nasopharyngeal Swab     Status: None   Collection Time: 11/19/20  3:35 PM   Specimen: Nasopharyngeal Swab; Nasopharyngeal(NP) swabs in vial transport medium  Result Value Ref Range Status   SARS Coronavirus 2 by RT PCR NEGATIVE NEGATIVE Final    Comment: (NOTE) SARS-CoV-2 target nucleic acids are NOT DETECTED.  The SARS-CoV-2 RNA is generally detectable in upper respiratory specimens during the acute phase of infection. The lowest concentration of SARS-CoV-2 viral copies this assay can detect is 138 copies/mL. A negative result does not preclude SARS-Cov-2 infection and should not be used as the sole basis for treatment or other patient management decisions. A negative result may occur with  improper specimen collection/handling, submission of specimen other than nasopharyngeal swab, presence of viral mutation(s) within the areas targeted by this assay, and inadequate number of viral copies(<138 copies/mL). A negative result must be combined with  clinical observations, patient history, and epidemiological information. The expected result is Negative.  Fact Sheet for Patients:  BloggerCourse.com  Fact Sheet for Healthcare Providers:  SeriousBroker.it  This test is no t yet approved or cleared by the [place] FDA and  has been authorized for detection and/or diagnosis of SARS-CoV-2 by FDA under an Emergency Use Authorization (EUA). This EUA will remain  in effect (meaning this test  can be used) for the duration of the COVID-19 declaration under Section 564(b)(1) of the Act, 21 U.S.C.section 360bbb-3(b)(1), unless the authorization is terminated  or revoked sooner.       Influenza A by PCR NEGATIVE NEGATIVE Final   Influenza B by PCR NEGATIVE NEGATIVE Final    Comment: (NOTE) The Xpert Xpress SARS-CoV-2/FLU/RSV plus assay is intended as an aid in the diagnosis of influenza from Nasopharyngeal swab specimens and should not be used as a sole basis for treatment. Nasal washings and aspirates are unacceptable for Xpert Xpress SARS-CoV-2/FLU/RSV testing.  Fact Sheet for Patients: BloggerCourse.com  Fact Sheet for Healthcare Providers: SeriousBroker.it  This test is not yet approved or cleared by the [place] FDA and has been authorized for detection and/or diagnosis of SARS-CoV-2 by FDA under an Emergency Use Authorization (EUA). This EUA will remain in effect (meaning this test can be used) for the duration of the COVID-19 declaration under Section 564(b)(1) of the Act, 21 U.S.C. section 360bbb-3(b)(1), unless the authorization is terminated or revoked.  Performed at [facility], [address] [place], [place] [address]      Labs: Basic Metabolic Panel: Recent Labs  Lab 11/19/20 1340 11/20/20 0506 11/21/20 0434 11/22/20 0456  NA 138 142 138 147*  K 3.7 3.6 3.6 3.9  CL 104 106 109 114*  CO2 22 24 23 26   GLUCOSE 112* 125* 112* 104*  BUN 17 15 16 14   CREATININE 1.09 0.92 0.76 0.84  CALCIUM 9.9 9.6 8.9 9.5  MG  --   --  2.1  --    Liver Function Tests: Recent Labs  Lab 11/19/20 1340 11/20/20 0506  AST 22 17  ALT 20 17  ALKPHOS 127* 110  BILITOT 0.8 0.6  PROT 8.2* 7.7  ALBUMIN 4.4 4.2   CBC: Recent Labs  Lab 11/19/20 1340 11/20/20 0506  WBC 10.3 9.0  NEUTROABS 8.0*  --   HGB 15.8 14.5  HCT 46.7 42.1  MCV 87.6 87.0  PLT 336 290   CBG: Recent Labs  Lab  11/21/20 1755  GLUCAP 108*   Hgb A1c No results for input(s): HGBA1C in the last 72 hours. Lipid Profile No results for input(s): CHOL, HDL, LDLCALC, TRIG, CHOLHDL, LDLDIRECT in the last 72 hours. Thyroid function studies No results for input(s): TSH, T4TOTAL, T3FREE, THYROIDAB in the last 72 hours.  Invalid input(s): FREET3 Urinalysis    Component Value Date/Time   COLORURINE YELLOW (A) 11/19/2020 1930   APPEARANCEUR HAZY (A) 11/19/2020 1930   LABSPEC 1.025 11/19/2020 1930   PHURINE 6.0 11/19/2020 1930   GLUCOSEU NEGATIVE 11/19/2020 1930   HGBUR NEGATIVE 11/19/2020 1930   HGBUR negative 10/07/2008 0752   BILIRUBINUR MODERATE (A) 11/19/2020 1930   BILIRUBINUR negative 04/03/2015 1740   KETONESUR 40 (A) 11/19/2020 1930   PROTEINUR NEGATIVE 11/19/2020 1930   UROBILINOGEN 0.2 04/03/2015 1740   UROBILINOGEN 1.0 10/01/2014 2010   NITRITE NEGATIVE 11/19/2020 1930   LEUKOCYTESUR NEGATIVE 11/19/2020 1930    FURTHER DISCHARGE INSTRUCTIONS:   Get Medicines reviewed and adjusted: Please take all  your medications with you for your next visit with your Primary MD   Laboratory/radiological data: Please request your Primary MD to go over all hospital tests and procedure/radiological results at the follow up, please ask your Primary MD to get all Hospital records sent to his/her office.   In some cases, they will be blood work, cultures and biopsy results pending at the time of your discharge. Please request that your primary care M.D. goes through all the records of your hospital data and follows up on these results.   Also Note the following: If you experience worsening of your admission symptoms, develop shortness of breath, life threatening emergency, suicidal or homicidal thoughts you must seek medical attention immediately by calling 911 or calling your MD immediately  if symptoms less severe.   You must read complete instructions/literature along with all the possible adverse  reactions/side effects for all the Medicines you take and that have been prescribed to you. Take any new Medicines after you have completely understood and accpet all the possible adverse reactions/side effects.    Do not drive when taking Pain medications or sleeping medications (Benzodaizepines)   Do not take more than prescribed Pain, Sleep and Anxiety Medications. It is not advisable to combine anxiety,sleep and pain medications without talking with your primary care practitioner   Special Instructions: If you have smoked or chewed Tobacco  in the last 2 yrs please stop smoking, stop any regular Alcohol  and or any Recreational drug use.   Wear Seat belts while driving.   Please note: You were cared for by a hospitalist during your hospital stay. Once you are discharged, your primary care physician will handle any further medical issues. Please note that NO REFILLS for any discharge medications will be authorized once you are discharged, as it is imperative that you return to your primary care physician (or establish a relationship with a primary care physician if you do not have one) for your post hospital discharge needs so that they can reassess your need for medications and monitor your lab values.  Time coordinating discharge: 35 minutes  SIGNED:  Pamella Pert, MD, PhD 11/22/2020, 8:27 AM

## 2022-07-11 ENCOUNTER — Emergency Department (HOSPITAL_COMMUNITY): Payer: Medicare Other

## 2022-07-11 ENCOUNTER — Encounter (HOSPITAL_COMMUNITY): Payer: Self-pay

## 2022-07-11 ENCOUNTER — Other Ambulatory Visit: Payer: Self-pay

## 2022-07-11 ENCOUNTER — Emergency Department (HOSPITAL_COMMUNITY)
Admission: EM | Admit: 2022-07-11 | Discharge: 2022-07-11 | Disposition: A | Discharge status: Home / Self Care | Payer: Medicare Other | Source: Home / Self Care | Attending: Emergency Medicine | Admitting: Emergency Medicine

## 2022-07-11 DIAGNOSIS — R059 Cough, unspecified: Secondary | ICD-10-CM

## 2022-07-11 DIAGNOSIS — G928 Other toxic encephalopathy: Secondary | ICD-10-CM | POA: Diagnosis not present

## 2022-07-11 DIAGNOSIS — T40711A Poisoning by cannabis, accidental (unintentional), initial encounter: Secondary | ICD-10-CM | POA: Diagnosis not present

## 2022-07-11 NOTE — ED Provider Notes (Signed)
[place] EMERGENCY DEPARTMENT AT [facility] Provider Note   CSN: [id number] Arrival date & time: 07/11/22  1645     History  No chief complaint on file.   Brent Morales Hageman. is a 47 y.o. male.  The history is provided by the EMS personnel and medical records. No language interpreter was used.     47 year old male significant history of anxiety, fibromyalgia's, pancreatitis, presenting with complaints of cold symptoms. Per ED triage note, patient initially brought from here via EMS from home with cold symptoms.  No report patient has congestion runny nose and cough for several days.  Patient was ambulating per EMS.  However, on exam patient is laying in bed appears to be sleeping, arousable but refused to engage in conversation.  When asked if he has any thoughts of harming himself or anyone else patient does not respond.  He is noncooperative at this time.  Home Medications Prior to Admission medications   Medication Sig Start Date End Date Taking? Authorizing Provider  DULoxetine (CYMBALTA) 30 MG capsule Take 30 mg by mouth daily. 07/14/20   [provider]  gabapentin (NEURONTIN) 300 MG capsule Take 900 mg by mouth 3 (three) times daily. 10/07/20   [provider]  hydrOXYzine (ATARAX/VISTARIL) 25 MG tablet Take 25 mg by mouth at bedtime. 11/15/20   [provider]  ibuprofen (ADVIL,MOTRIN) 200 MG tablet Take 4 tablets (800 mg total) every 8 (eight) hours as needed by mouth for moderate pain. Reported on 09/01/2015 Patient taking differently: Take 400 mg by mouth every 8 (eight) hours as needed for moderate pain. 01/25/17   Elpidio Anis, PA-C  mirtazapine (REMERON SOL-TAB) 30 MG disintegrating tablet Take 30 mg by mouth at bedtime. 02/28/20   [provider]  omeprazole (PRILOSEC) 20 MG capsule Take 20 mg by mouth daily. 07/03/20 07/03/21  [provider]  ondansetron (ZOFRAN) 4 MG tablet Take 1 tablet (4 mg total) by mouth every 4  (four) hours as needed for nausea. 11/22/20   Leatha Gilding, MD  oxyCODONE (ROXICODONE) 15 MG immediate release tablet Take 15 mg by mouth 2 (two) times daily. 10/30/20   [provider]      Allergies    Patient has no known allergies.    Review of Systems   Review of Systems  Unable to perform ROS: Psychiatric disorder    Physical Exam Updated Vital Signs BP (!) 144/94   Pulse 77   Temp 98.3 F (36.8 C) (Oral)   Resp 18   Wt 71 kg   SpO2 98%   BMI 21.23 kg/m  Physical Exam Vitals and nursing note reviewed.  Constitutional:      General: He is not in acute distress.    Appearance: He is well-developed.     Comments: Patient laying in bed eyes closed but appears to be in no acute discomfort.  Does not seem to be engaging in conversation.  HENT:     Head: Normocephalic and atraumatic.  Eyes:     Conjunctiva/sclera: Conjunctivae normal.  Cardiovascular:     Rate and Rhythm: Normal rate and regular rhythm.     Pulses: Normal pulses.     Heart sounds: Normal heart sounds.  Pulmonary:     Effort: Pulmonary effort is normal.     Breath sounds: Normal breath sounds. No wheezing, rhonchi or rales.  Abdominal:     Palpations: Abdomen is soft.     Tenderness: There is no abdominal tenderness.  Musculoskeletal:     Cervical back: Neck supple.  Skin:    Findings: No rash.  Neurological:     Comments: Able to move all 4 extremities but does not want to follow any commands.     ED Results / Procedures / Treatments   Labs (all labs ordered are listed, but only abnormal results are displayed) Labs Reviewed - No data to display  EKG None  Radiology No results found.  Procedures Procedures    Medications Ordered in ED Medications - No data to display  ED Course/ Medical Decision Making/ A&P                             Medical Decision Making Amount and/or Complexity of Data Reviewed Radiology: ordered.   BP (!) 144/94   Pulse 77   Temp 98.3 F  (36.8 C) (Oral)   Resp 18   Wt 71 kg   SpO2 98%   BMI 21.23 kg/m   35:3 PM 47 year old male significant history of anxiety, fibromyalgia's, pancreatitis, presenting with complaints of cold symptoms. Per ED triage note, patient initially brought from here via EMS from home with cold symptoms.  No report patient has congestion runny nose and cough for several days.  Patient was ambulating per EMS.  However, on exam patient is laying in bed appears to be sleeping, arousable but refused to engage in conversation.  When asked if he has any thoughts of harming himself or anyone else patient does not respond.  He is noncooperative at this time.  On exam, patient is laying bed his eyes closed, refused to participate in any history and physical exam.  He does not appear to be in any acute discomfort.  Heart with normal rate and rhythm, lungs otherwise clear to auscultation bilaterally he does not have any reproducible chest wall or abdominal tenderness.  Vital signs overall reassuring no fever no hypoxia.  I have ordered a chest x-ray but patient refused.  EMR reviewed patient does have significant psychiatric illness and has been seen evaluated by psychiatry multiple times in the past.  At this time I have not identified any acute emergent medical condition or psychiatric illness.  Patient will be discharged.  I have low suspicion for catatonia, I doubt patient is having an acute psychosis.  However, patient does have a high likelihood of return to the ER and I have strong encouraged patient to return if he would like for [place] to evaluate for his complaint.        Final Clinical Impression(s) / ED Diagnoses Final diagnoses:  Cough, unspecified type    Rx / DC Orders ED Discharge Orders     None         Fayrene Helper, PA-C 07/11/22 1736    Mardene Sayer, MD 07/11/22 [phone]

## 2022-07-11 NOTE — Discharge Instructions (Signed)
Unfortunately we were unable to evaluate you as you are unwilling to communicate.  You may follow-up with your doctor for further care.

## 2022-07-11 NOTE — ED Triage Notes (Signed)
BIBA from home with c/o congestion, runny nose, and cough for "several days" Pt ambulatory with EMS.

## 2022-07-12 ENCOUNTER — Emergency Department (HOSPITAL_COMMUNITY): Payer: Medicare Other

## 2022-07-12 ENCOUNTER — Other Ambulatory Visit: Payer: Self-pay

## 2022-07-12 ENCOUNTER — Inpatient Hospital Stay (HOSPITAL_COMMUNITY)
Admission: EM | Admit: 2022-07-12 | Discharge: 2022-07-17 | DRG: 917 | Disposition: A | Discharge status: Home / Self Care | Payer: Medicare Other | Attending: Student | Admitting: Student

## 2022-07-12 ENCOUNTER — Encounter (HOSPITAL_COMMUNITY): Payer: Self-pay

## 2022-07-12 DIAGNOSIS — F1210 Cannabis abuse, uncomplicated: Secondary | ICD-10-CM | POA: Diagnosis present

## 2022-07-12 DIAGNOSIS — Z5320 Procedure and treatment not carried out because of patient's decision for unspecified reasons: Secondary | ICD-10-CM | POA: Diagnosis present

## 2022-07-12 DIAGNOSIS — T17218A Gastric contents in pharynx causing other injury, initial encounter: Secondary | ICD-10-CM | POA: Diagnosis present

## 2022-07-12 DIAGNOSIS — G8929 Other chronic pain: Secondary | ICD-10-CM | POA: Diagnosis present

## 2022-07-12 DIAGNOSIS — M797 Fibromyalgia: Secondary | ICD-10-CM | POA: Diagnosis present

## 2022-07-12 DIAGNOSIS — Z800 Family history of malignant neoplasm of digestive organs: Secondary | ICD-10-CM | POA: Diagnosis not present

## 2022-07-12 DIAGNOSIS — Z8249 Family history of ischemic heart disease and other diseases of the circulatory system: Secondary | ICD-10-CM

## 2022-07-12 DIAGNOSIS — E876 Hypokalemia: Secondary | ICD-10-CM | POA: Diagnosis present

## 2022-07-12 DIAGNOSIS — R400 Somnolence: Secondary | ICD-10-CM | POA: Diagnosis not present

## 2022-07-12 DIAGNOSIS — F419 Anxiety disorder, unspecified: Secondary | ICD-10-CM | POA: Diagnosis present

## 2022-07-12 DIAGNOSIS — F32A Depression, unspecified: Secondary | ICD-10-CM | POA: Diagnosis present

## 2022-07-12 DIAGNOSIS — T424X1A Poisoning by benzodiazepines, accidental (unintentional), initial encounter: Secondary | ICD-10-CM | POA: Diagnosis present

## 2022-07-12 DIAGNOSIS — T40711A Poisoning by cannabis, accidental (unintentional), initial encounter: Secondary | ICD-10-CM | POA: Diagnosis present

## 2022-07-12 DIAGNOSIS — R0689 Other abnormalities of breathing: Secondary | ICD-10-CM | POA: Diagnosis not present

## 2022-07-12 DIAGNOSIS — Z79899 Other long term (current) drug therapy: Secondary | ICD-10-CM | POA: Diagnosis not present

## 2022-07-12 DIAGNOSIS — R4182 Altered mental status, unspecified: Secondary | ICD-10-CM | POA: Diagnosis not present

## 2022-07-12 DIAGNOSIS — G9340 Encephalopathy, unspecified: Secondary | ICD-10-CM | POA: Diagnosis not present

## 2022-07-12 DIAGNOSIS — Z9189 Other specified personal risk factors, not elsewhere classified: Secondary | ICD-10-CM | POA: Diagnosis not present

## 2022-07-12 DIAGNOSIS — F17210 Nicotine dependence, cigarettes, uncomplicated: Secondary | ICD-10-CM | POA: Diagnosis present

## 2022-07-12 DIAGNOSIS — G928 Other toxic encephalopathy: Secondary | ICD-10-CM | POA: Diagnosis present

## 2022-07-12 DIAGNOSIS — F1910 Other psychoactive substance abuse, uncomplicated: Secondary | ICD-10-CM | POA: Diagnosis not present

## 2022-07-12 DIAGNOSIS — A084 Viral intestinal infection, unspecified: Secondary | ICD-10-CM | POA: Diagnosis present

## 2022-07-12 DIAGNOSIS — Z1152 Encounter for screening for COVID-19: Secondary | ICD-10-CM

## 2022-07-12 DIAGNOSIS — J189 Pneumonia, unspecified organism: Secondary | ICD-10-CM | POA: Diagnosis present

## 2022-07-12 DIAGNOSIS — R569 Unspecified convulsions: Secondary | ICD-10-CM | POA: Diagnosis not present

## 2022-07-12 DIAGNOSIS — T43021A Poisoning by tetracyclic antidepressants, accidental (unintentional), initial encounter: Secondary | ICD-10-CM | POA: Diagnosis present

## 2022-07-12 DIAGNOSIS — M4802 Spinal stenosis, cervical region: Secondary | ICD-10-CM | POA: Diagnosis present

## 2022-07-12 DIAGNOSIS — J9601 Acute respiratory failure with hypoxia: Secondary | ICD-10-CM | POA: Diagnosis present

## 2022-07-12 DIAGNOSIS — K226 Gastro-esophageal laceration-hemorrhage syndrome: Secondary | ICD-10-CM | POA: Diagnosis present

## 2022-07-12 DIAGNOSIS — K209 Esophagitis, unspecified without bleeding: Secondary | ICD-10-CM | POA: Diagnosis present

## 2022-07-12 DIAGNOSIS — I639 Cerebral infarction, unspecified: Secondary | ICD-10-CM | POA: Diagnosis present

## 2022-07-12 DIAGNOSIS — Z79891 Long term (current) use of opiate analgesic: Secondary | ICD-10-CM

## 2022-07-12 DIAGNOSIS — R112 Nausea with vomiting, unspecified: Secondary | ICD-10-CM | POA: Diagnosis not present

## 2022-07-12 DIAGNOSIS — J00 Acute nasopharyngitis [common cold]: Secondary | ICD-10-CM | POA: Diagnosis present

## 2022-07-12 DIAGNOSIS — I4510 Unspecified right bundle-branch block: Secondary | ICD-10-CM | POA: Diagnosis present

## 2022-07-12 DIAGNOSIS — J9600 Acute respiratory failure, unspecified whether with hypoxia or hypercapnia: Secondary | ICD-10-CM | POA: Diagnosis not present

## 2022-07-12 DIAGNOSIS — K859 Acute pancreatitis without necrosis or infection, unspecified: Secondary | ICD-10-CM | POA: Diagnosis present

## 2022-07-12 LAB — CBC WITH DIFFERENTIAL/PLATELET
Abs Immature Granulocytes: 0.05 10*3/uL (ref 0.00–0.07)
Basophils Absolute: 0 10*3/uL (ref 0.0–0.1)
Basophils Relative: 0 %
Eosinophils Absolute: 0 10*3/uL (ref 0.0–0.5)
Eosinophils Relative: 0 %
HCT: 49.4 % (ref 39.0–52.0)
Hemoglobin: 16.4 g/dL (ref 13.0–17.0)
Immature Granulocytes: 0 %
Lymphocytes Relative: 10 %
Lymphs Abs: 1.7 10*3/uL (ref 0.7–4.0)
MCH: 30.4 pg (ref 26.0–34.0)
MCHC: 33.2 g/dL (ref 30.0–36.0)
MCV: 91.5 fL (ref 80.0–100.0)
Monocytes Absolute: 1.8 10*3/uL — ABNORMAL HIGH (ref 0.1–1.0)
Monocytes Relative: 11 %
Neutro Abs: 13.7 10*3/uL — ABNORMAL HIGH (ref 1.7–7.7)
Neutrophils Relative %: 79 %
Platelets: 416 10*3/uL — ABNORMAL HIGH (ref 150–400)
RBC: 5.4 MIL/uL (ref 4.22–5.81)
RDW: 14.3 % (ref 11.5–15.5)
WBC: 17.2 10*3/uL — ABNORMAL HIGH (ref 4.0–10.5)
nRBC: 0 % (ref 0.0–0.2)

## 2022-07-12 LAB — I-STAT VENOUS BLOOD GAS, ED
Acid-Base Excess: 7 mmol/L — ABNORMAL HIGH (ref 0.0–2.0)
Bicarbonate: 30 mmol/L — ABNORMAL HIGH (ref 20.0–28.0)
Calcium, Ion: 1.08 mmol/L — ABNORMAL LOW (ref 1.15–1.40)
HCT: 49 % (ref 39.0–52.0)
Hemoglobin: 16.7 g/dL (ref 13.0–17.0)
O2 Saturation: 88 %
Potassium: 2.8 mmol/L — ABNORMAL LOW (ref 3.5–5.1)
Sodium: 146 mmol/L — ABNORMAL HIGH (ref 135–145)
TCO2: 31 mmol/L (ref 22–32)
pCO2, Ven: 36.3 mmHg — ABNORMAL LOW (ref 44–60)
pH, Ven: 7.526 — ABNORMAL HIGH (ref 7.25–7.43)
pO2, Ven: 49 mmHg — ABNORMAL HIGH (ref 32–45)

## 2022-07-12 LAB — I-STAT ARTERIAL BLOOD GAS, ED
Acid-Base Excess: 5 mmol/L — ABNORMAL HIGH (ref 0.0–2.0)
Bicarbonate: 28.5 mmol/L — ABNORMAL HIGH (ref 20.0–28.0)
Calcium, Ion: 1.13 mmol/L — ABNORMAL LOW (ref 1.15–1.40)
HCT: 42 % (ref 39.0–52.0)
Hemoglobin: 14.3 g/dL (ref 13.0–17.0)
O2 Saturation: 100 %
Patient temperature: 99.8
Potassium: 2.8 mmol/L — ABNORMAL LOW (ref 3.5–5.1)
Sodium: 145 mmol/L (ref 135–145)
TCO2: 30 mmol/L (ref 22–32)
pCO2 arterial: 37.7 mmHg (ref 32–48)
pH, Arterial: 7.488 — ABNORMAL HIGH (ref 7.35–7.45)
pO2, Arterial: 236 mmHg — ABNORMAL HIGH (ref 83–108)

## 2022-07-12 LAB — COMPREHENSIVE METABOLIC PANEL
ALT: 24 U/L (ref 0–44)
AST: 35 U/L (ref 15–41)
Albumin: 4.6 g/dL (ref 3.5–5.0)
Alkaline Phosphatase: 116 U/L (ref 38–126)
Anion gap: 18 — ABNORMAL HIGH (ref 5–15)
BUN: 42 mg/dL — ABNORMAL HIGH (ref 6–20)
CO2: 26 mmol/L (ref 22–32)
Calcium: 9.9 mg/dL (ref 8.9–10.3)
Chloride: 101 mmol/L (ref 98–111)
Creatinine, Ser: 1.08 mg/dL (ref 0.61–1.24)
GFR, Estimated: >60 mL/min (ref >60)
Glucose, Bld: 116 mg/dL — ABNORMAL HIGH (ref 70–99)
Potassium: 2.8 mmol/L — ABNORMAL LOW (ref 3.5–5.1)
Sodium: 145 mmol/L (ref 135–145)
Total Bilirubin: 0.8 mg/dL (ref 0.3–1.2)
Total Protein: 8.7 g/dL — ABNORMAL HIGH (ref 6.5–8.1)

## 2022-07-12 LAB — TROPONIN I (HIGH SENSITIVITY)
Troponin I (High Sensitivity): 9 ng/L (ref <18)
Troponin I (High Sensitivity): 11 ng/L (ref <18)

## 2022-07-12 LAB — RAPID URINE DRUG SCREEN, HOSP PERFORMED
Amphetamines: NOT DETECTED
Barbiturates: NOT DETECTED
Benzodiazepines: POSITIVE — AB
Cocaine: NOT DETECTED
Opiates: NOT DETECTED
Tetrahydrocannabinol: POSITIVE — AB

## 2022-07-12 LAB — I-STAT CHEM 8, ED
BUN: 41 mg/dL — ABNORMAL HIGH (ref 6–20)
Calcium, Ion: 1.09 mmol/L — ABNORMAL LOW (ref 1.15–1.40)
Chloride: 105 mmol/L (ref 98–111)
Creatinine, Ser: 1 mg/dL (ref 0.61–1.24)
Glucose, Bld: 117 mg/dL — ABNORMAL HIGH (ref 70–99)
HCT: 50 % (ref 39.0–52.0)
Hemoglobin: 17 g/dL (ref 13.0–17.0)
Potassium: 2.8 mmol/L — ABNORMAL LOW (ref 3.5–5.1)
Sodium: 146 mmol/L — ABNORMAL HIGH (ref 135–145)
TCO2: 28 mmol/L (ref 22–32)

## 2022-07-12 LAB — ETHANOL: Alcohol, Ethyl (B): <10 mg/dL (ref <10)

## 2022-07-12 LAB — SALICYLATE LEVEL: Salicylate Lvl: <7 mg/dL — ABNORMAL LOW (ref 7.0–30.0)

## 2022-07-12 LAB — MAGNESIUM: Magnesium: 2.7 mg/dL — ABNORMAL HIGH (ref 1.7–2.4)

## 2022-07-12 LAB — LACTIC ACID, PLASMA: Lactic Acid, Venous: 1.5 mmol/L (ref 0.5–1.9)

## 2022-07-12 LAB — AMMONIA: Ammonia: 35 umol/L (ref 9–35)

## 2022-07-12 LAB — LIPASE, BLOOD: Lipase: 38 U/L (ref 11–51)

## 2022-07-12 LAB — OCCULT BLOOD X 1 CARD TO LAB, STOOL: Fecal Occult Bld: POSITIVE — AB

## 2022-07-12 LAB — ACETAMINOPHEN LEVEL: Acetaminophen (Tylenol), Serum: <10 ug/mL — ABNORMAL LOW (ref 10–30)

## 2022-07-12 MED ORDER — POLYETHYLENE GLYCOL 3350 17 G PO PACK: 17.0000 g | PACK | Freq: Every day | ORAL | Status: DC | PRN

## 2022-07-12 MED ORDER — PROPOFOL 1000 MG/100ML IV EMUL
5.0000 ug/kg/min | INTRAVENOUS | Status: DC
  Administered 2022-07-12: 20 ug/kg/min via INTRAVENOUS
  Filled 2022-07-12: qty 100

## 2022-07-12 MED ORDER — MAGNESIUM SULFATE 2 GM/50ML IV SOLN
2.0000 g | Freq: Once | INTRAVENOUS | Status: AC
  Administered 2022-07-13: 2 g via INTRAVENOUS
  Filled 2022-07-12: qty 50

## 2022-07-12 MED ORDER — SODIUM CHLORIDE 0.9 % IV BOLUS
1000.0000 mL | Freq: Once | INTRAVENOUS | Status: AC
  Administered 2022-07-12: 1000 mL via INTRAVENOUS

## 2022-07-12 MED ORDER — ETOMIDATE 2 MG/ML IV SOLN
20.0000 mg | Freq: Once | INTRAVENOUS | Status: AC
  Administered 2022-07-12: 20 mg via INTRAVENOUS

## 2022-07-12 MED ORDER — DOCUSATE SODIUM 50 MG/5ML PO LIQD: 100.0000 mg | Freq: Two times a day (BID) | ORAL | Status: DC | PRN

## 2022-07-12 MED ORDER — FENTANYL BOLUS VIA INFUSION: 50.0000 ug | INTRAVENOUS | Status: DC | PRN

## 2022-07-12 MED ORDER — DEXMEDETOMIDINE HCL IN NACL 400 MCG/100ML IV SOLN
0.0000 ug/kg/h | INTRAVENOUS | Status: DC
  Administered 2022-07-12: 0.7 ug/kg/h via INTRAVENOUS

## 2022-07-12 MED ORDER — HYDRALAZINE HCL 20 MG/ML IJ SOLN
10.0000 mg | INTRAMUSCULAR | Status: DC | PRN
  Administered 2022-07-14: 20 mg via INTRAVENOUS
  Filled 2022-07-12: qty 1

## 2022-07-12 MED ORDER — ETOMIDATE 2 MG/ML IV SOLN
INTRAVENOUS | Status: AC | PRN
  Administered 2022-07-12: 20 mg via INTRAVENOUS

## 2022-07-12 MED ORDER — ONDANSETRON HCL 4 MG/2ML IJ SOLN: 4.0000 mg | Freq: Once | INTRAMUSCULAR | Status: DC

## 2022-07-12 MED ORDER — MIDAZOLAM HCL (PF) 10 MG/2ML IJ SOLN
INTRAMUSCULAR | Status: AC
  Administered 2022-07-12: 5 mg
  Filled 2022-07-12: qty 2

## 2022-07-12 MED ORDER — SUCCINYLCHOLINE CHLORIDE 20 MG/ML IJ SOLN
INTRAMUSCULAR | Status: AC | PRN
  Administered 2022-07-12: 140 mg via INTRAVENOUS

## 2022-07-12 MED ORDER — ACETAMINOPHEN 325 MG PO TABS: 650.0000 mg | ORAL_TABLET | ORAL | Status: DC | PRN

## 2022-07-12 MED ORDER — FENTANYL 2500MCG IN NS 250ML (10MCG/ML) PREMIX INFUSION
50.0000 ug/h | INTRAVENOUS | Status: DC
  Administered 2022-07-12: 100 ug/h via INTRAVENOUS
  Filled 2022-07-12: qty 250

## 2022-07-12 MED ORDER — FENTANYL 2500MCG IN NS 250ML (10MCG/ML) PREMIX INFUSION
50.0000 ug/h | INTRAVENOUS | Status: DC
  Administered 2022-07-13 – 2022-07-14 (×3): 200 ug/h via INTRAVENOUS
  Filled 2022-07-12 (×4): qty 250

## 2022-07-12 MED ORDER — FENTANYL BOLUS VIA INFUSION
50.0000 ug | INTRAVENOUS | Status: DC | PRN
  Administered 2022-07-13 (×7): 100 ug via INTRAVENOUS

## 2022-07-12 MED ORDER — PANTOPRAZOLE 80MG IVPB - SIMPLE MED
80.0000 mg | Freq: Once | INTRAVENOUS | Status: AC
  Administered 2022-07-13: 80 mg via INTRAVENOUS
  Filled 2022-07-12 (×2): qty 100

## 2022-07-12 MED ORDER — ONDANSETRON HCL 4 MG/2ML IJ SOLN
4.0000 mg | Freq: Four times a day (QID) | INTRAMUSCULAR | Status: DC | PRN
  Administered 2022-07-16 – 2022-07-17 (×3): 4 mg via INTRAVENOUS
  Filled 2022-07-12 (×3): qty 2

## 2022-07-12 MED ORDER — POTASSIUM CHLORIDE 10 MEQ/100ML IV SOLN
10.0000 meq | INTRAVENOUS | Status: AC
  Administered 2022-07-13 (×6): 10 meq via INTRAVENOUS
  Filled 2022-07-12 (×6): qty 100

## 2022-07-12 MED ORDER — SUCCINYLCHOLINE CHLORIDE 200 MG/10ML IV SOSY
100.0000 mg | PREFILLED_SYRINGE | Freq: Once | INTRAVENOUS | Status: AC
  Administered 2022-07-12: 140 mg via INTRAVENOUS

## 2022-07-12 MED ORDER — PROPOFOL 1000 MG/100ML IV EMUL
0.0000 ug/kg/min | INTRAVENOUS | Status: DC
  Administered 2022-07-13 – 2022-07-14 (×8): 50 ug/kg/min via INTRAVENOUS
  Filled 2022-07-12: qty 100
  Filled 2022-07-12: qty 200
  Filled 2022-07-12 (×5): qty 100

## 2022-07-12 MED ORDER — PANTOPRAZOLE SODIUM 40 MG IV SOLR
40.0000 mg | Freq: Two times a day (BID) | INTRAVENOUS | Status: DC
  Administered 2022-07-13 – 2022-07-14 (×4): 40 mg via INTRAVENOUS
  Filled 2022-07-12 (×4): qty 10

## 2022-07-12 MED ORDER — IOHEXOL 350 MG/ML SOLN
75.0000 mL | Freq: Once | INTRAVENOUS | Status: AC | PRN
  Administered 2022-07-12: 75 mL via INTRAVENOUS

## 2022-07-12 NOTE — H&P (Cosign Needed Addendum)
NAME:  Brent Walsingham., MRN:  [id number], DOB:  1975-08-05, LOS: 0 ADMISSION DATE:  07/12/2022, CONSULTATION DATE:  5/3 REFERRING MD:  Dr. Jacqulyn Bath, CHIEF COMPLAINT:  Stroke   History of Present Illness:  Patient is a 47 year old male with pertinent PMH anxiety/depression, cervical stenosis, pancreatitis presents to [facility] ED on 5/30 with AMS.  Over the past 4 days, patient has been having increased somnolence and weakness associated associated w/ cold like symptoms and low grade fever 99.5 F.  Per mother he has been coughing up brown sputum. Patient also having episodes of N/V.  On 5/2 patient went to [facility] ED but patient refused work up and was discharged.   Patient came back to [facility] on 5/3 for worsening AMS. BP 170/101. Afebrile. Patient altered and localizing w/ LUE>RUE. Patient throwing up coffee ground emesis and not protecting airway requiring intubation. Ethanol, acetaminophen, salicylate level wnl. Ammonia 35. UDS pending. 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 Neuro consulted. MRI ordered. CT chest/abd/pelvis w/ no acute abnormality; distal esophageal wall thickening likely esophagitis. Ifob positive, hgb 16.4. Patient given Protonix bolus. PCCM consulted for icu admission.  Pertinent ed labs: K 2.8, glucose 116, wbc 17.2, la 1.5  Pertinent  Medical History   Past Medical History:  Diagnosis Date   Anxiety    Arthritis    Cervical stenosis of spinal canal    Depression    Fibromyalgia    Headache    Hernia    Bilateral Inguinal   Pancreatitis    Syncope    April 2016     Significant Hospital Events: Including procedures, antibiotic start and stop dates in addition to other pertinent events   5/3 patient altered; intubated for airway protection; ct head showing posterior circulation stroke.  Interim History / Subjective:  Sedated w/ prop, fentanyl, precedex  Objective   Blood pressure 130/83, pulse 64, temperature 98.4 F (36.9 C), resp. rate 16, height 6' (1.829 m), weight 71  kg, SpO2 100 %.    Vent Mode: PRVC FiO2 (%):  [100 %] 100 % Set Rate:  [16 bmp] 16 bmp Vt Set:  [620 mL] 620 mL PEEP:  [5 cmH20] 5 cmH20 Plateau Pressure:  [19 cmH20] 19 cmH20  No intake or output data in the 24 hours ending 07/12/22 2154 Filed Weights   07/12/22 1902  Weight: 71 kg    Examination: General:  critically ill appearing on mech vent HEENT: MM pink/moist; ETT in place Neuro: sedate on prop/fent/precedex; moves ble to pain; no mvt appreciated on UE; PERRL; cough/gag present CV: s1s2, RRR, no m/r/g PULM:  dim clear BS bilaterally; on mech vent PRVC GI: soft, bsx4 active  Extremities: warm/dry, no edema  Skin: no rashes or lesions appreciated   Resolved Hospital Problem list     Assessment & Plan:  L occipital stroke: CT head: L occipital lobe stroke Acute encephalopathy: likely to stroke; also on multiple anxiety meds and oxy -ethanol, ammonia, salicylate/acetaminophen level wnl -uds pending P: -neuro following; appreciate recs -MRI pending -frequent neuro checks -limit sedating meds -BP goal per neuro -statin, DAPT per neuro -echo -Continue neuroprotective measures- normothermia, euglycemia, HOB greater than 30, head in neutral alignment, normocapnia, normoxia.  -PT/OT/SLP when appropriate  Acute respiratory failure: intubated for airway protection due to above P: -LTVV strategy with tidal volumes of 6-8 cc/kg ideal body weight -Wean PEEP/FiO2 for SpO2 >92% -VAP bundle in place -Daily SAT and SBT -PAD protocol in place -wean sedation for RASS goal 0 to -  1  Hypokalemia P: -check mag -replete k w/ mag  Possible GI bleed: throwing up coffee ground emesis -Ifob positive -possible Esophagitis seen on CT chest/abd/pelvis N/V P: -PPI bid -prn zofran -hgb stable; trend cbc and monitor for acute bleeding -GI consult in am   Leukocytosis P: -likely reactive -trend wbc/fever curve  Anxiety/depression Fibromyalgia P: -hold home meds   Best  Practice (right click and "Reselect all SmartList Selections" daily)   Diet/type: NPO DVT prophylaxis: SCD GI prophylaxis: PPI Lines: N/A Foley:  N/A Code Status:  full code Last date of multidisciplinary goals of care discussion [5/3 talked with mother at bedside and updated]  Labs   CBC: Recent Labs  Lab 07/12/22 1925 07/12/22 1927  WBC 17.2*  --   NEUTROABS 13.7*  --   HGB 16.4 17.0  16.7  HCT 49.4 50.0  49.0  MCV 91.5  --   PLT 416*  --     Basic Metabolic Panel: Recent Labs  Lab 07/12/22 1925 07/12/22 1927  NA 145 146*  146*  K 2.8* 2.8*  2.8*  CL 101 105  CO2 26  --   GLUCOSE 116* 117*  BUN 42* 41*  CREATININE 1.08 1.00  CALCIUM 9.9  --    GFR: Estimated Creatinine Clearance: 93.7 mL/min (by C-G formula based on SCr of 1 mg/dL). Recent Labs  Lab 07/12/22 1925  WBC 17.2*  LATICACIDVEN 1.5    Liver Function Tests: Recent Labs  Lab 07/12/22 1925  AST 35  ALT 24  ALKPHOS 116  BILITOT 0.8  PROT 8.7*  ALBUMIN 4.6   Recent Labs  Lab 07/12/22 1925  LIPASE 38   Recent Labs  Lab 07/12/22 2057  AMMONIA 35    ABG    Component Value Date/Time   HCO3 30.0 (H) 07/12/2022 1927   TCO2 28 07/12/2022 1927   TCO2 31 07/12/2022 1927   O2SAT 88 07/12/2022 1927     Coagulation Profile: No results for input(s): "INR", "PROTIME" in the last 168 hours.  Cardiac Enzymes: No results for input(s): "CKTOTAL", "CKMB", "CKMBINDEX", "TROPONINI" in the last 168 hours.  HbA1C: No results found for: "HGBA1C"  CBG: No results for input(s): "GLUCAP" in the last 168 hours.  Review of Systems:   Patient is encephalopathic and/or intubated; therefore, history has been obtained from chart review.    Past Medical History:  He,  has a past medical history of Anxiety, Arthritis, Cervical stenosis of spinal canal, Depression, Fibromyalgia, Headache, Hernia, Pancreatitis, and Syncope.   Surgical History:   Past Surgical History:  Procedure Laterality  Date   INGUINAL HERNIA REPAIR     MULTIPLE EXTRACTIONS WITH ALVEOLOPLASTY N/A 10/13/2014   Procedure: Extraction of tooth #'s 2,4,5,6,7,8,9,10,11,12,13,14,15, 40,98,11,91,47,82,95,62,13,08 with alveoloplasty and bilateral mandibular tori reductions;  Surgeon: Charlynne Pander, DDS;  Location: [facility] OR;  Service: Oral Surgery;  Laterality: N/A;  NASAL TUBE   thumb surgery       Social History:   reports that he has been smoking cigarettes. He has a 77.00 pack-year smoking history. He has never used smokeless tobacco. He reports that he does not drink alcohol and does not use drugs.   Family History:  His family history includes Colon cancer in his paternal grandfather; Crohn's disease in his maternal aunt; Heart disease in his father; Hypertension in his mother.   Allergies No Known Allergies   Home Medications  Prior to Admission medications   Medication Sig Start Date End Date Taking? Authorizing Provider  DULoxetine (  CYMBALTA) 30 MG capsule Take 30 mg by mouth daily. 07/14/20   [provider]  gabapentin (NEURONTIN) 300 MG capsule Take 900 mg by mouth 3 (three) times daily. 10/07/20   [provider]  hydrOXYzine (ATARAX/VISTARIL) 25 MG tablet Take 25 mg by mouth at bedtime. 11/15/20   [provider]  ibuprofen (ADVIL,MOTRIN) 200 MG tablet Take 4 tablets (800 mg total) every 8 (eight) hours as needed by mouth for moderate pain. Reported on 09/01/2015 Patient taking differently: Take 400 mg by mouth every 8 (eight) hours as needed for moderate pain. 01/25/17   Elpidio Anis, PA-C  mirtazapine (REMERON SOL-TAB) 30 MG disintegrating tablet Take 30 mg by mouth at bedtime. 02/28/20   [provider]  omeprazole (PRILOSEC) 20 MG capsule Take 20 mg by mouth daily. 07/03/20 07/03/21  [provider]  ondansetron (ZOFRAN) 4 MG tablet Take 1 tablet (4 mg total) by mouth every 4 (four) hours as needed for nausea. 11/22/20   Leatha Gilding, MD  oxyCODONE  (ROXICODONE) 15 MG immediate release tablet Take 15 mg by mouth 2 (two) times daily. 10/30/20   [provider]     Critical care time: 45 minutes    JD Daryel [facility] 07/12/2022, 9:54 PM  Please see [email or web address] for pager details.  From 7A-7P if no response, please call [phone]. After hours, please call ELink [phone].

## 2022-07-12 NOTE — Consult Note (Addendum)
Neurology Consultation Reason for Consult: Possible Stroke on Head CT Requesting Physician: Alona Bene  CC: URI symptoms, lethargy, vomiting  History is obtained from: Mom at bedside, chart review   HPI: Brent Morales. is a 47 y.o. male with a PMHx significant for bipolar disorder, C-spine stenosis, anxiety/depression/fibromyalgia w/ prior SI, smoking, pancreatitis, snycope w/ negative tilt table testing (07/2010),   Evaluated by ED 5/2 for URI symptoms, arousable and moving all extremities but poorly participatory in exam and discharged for refusal of treatment.   Today presented again due to continued symptoms, and given emesis (with concern for some coffee ground emesis) and lethargy was intubated. Prior to intubation localizing at least with LUE and likely with RUE as well maybe more brisk with the left. He has been requiring high levels of sedation w/ propofol, fentanyl and precedex due to localizing to ETT and concern he may self-extubate.   History is provided by his mom, whom he lives with but who reports he is fully independent at baseline, driving and managing all of his own affairs.   She reports about 1 week prior they both had URI symptoms and he was complaining his chest hurting, felt he might have a pneumonia and had a productive cough with initially frothy sputum but then more brown in color. However, while she improved, he has been worsening with poor appetite progressing to vomiting 2-3 days ago. Since that time he has not been verbal and has been very lethargic. They have been sitting him up at home due to concern that he may aspirate his emesis. Tmax at home 99.4 but he has been diaphoretic, no skin rashes/changes noted.  He follows with [facility] behavioral health and Seroquel was uptitrated in mid April for his symptoms: "Dr. Carmelina Noun recommended the following during 06/17/2022 psychiatric consult meeting:  - Stop hydroxyzine - Increase quetiapine to 125 mg for 1 week, then if  still not sleeping well increase to 150 mg - Future consideration include stopping mirtazapine and further titrating sertraline and quetiapine"  No seizure like activity. One prior GTC of unclear etiology in the past per mom, on chart review listed as related to benzodiazepine withdrawal  ROS: Unable to obtain due to altered mental status.   Past Medical History:  Diagnosis Date   Anxiety    Arthritis    Cervical stenosis of spinal canal    Depression    Fibromyalgia    Headache    Hernia    Bilateral Inguinal   Pancreatitis    Syncope    April 2016   Past Surgical History:  Procedure Laterality Date   INGUINAL HERNIA REPAIR     MULTIPLE EXTRACTIONS WITH ALVEOLOPLASTY N/A 10/13/2014   Procedure: Extraction of tooth #'s 2,4,5,6,7,8,9,10,11,12,13,14,15, 13,08,65,78,46,96,29,52,84,13 with alveoloplasty and bilateral mandibular tori reductions;  Surgeon: Charlynne Pander, DDS;  Location: [facility] OR;  Service: Oral Surgery;  Laterality: N/A;  NASAL TUBE   thumb surgery     Current Outpatient Medications  Medication Instructions   DULoxetine (CYMBALTA) 30 mg, Oral, Daily   gabapentin (NEURONTIN) 900 mg, Oral, 3 times daily   hydrOXYzine (ATARAX) 25 mg, Oral, Daily at bedtime   ibuprofen (ADVIL) 800 mg, Oral, Every 8 hours PRN, Reported on 09/01/2015   mirtazapine (REMERON SOL-TAB) 30 mg, Oral, Daily at bedtime   omeprazole (PRILOSEC) 20 mg, Oral, Daily   ondansetron (ZOFRAN) 4 mg, Oral, Every 4 hours PRN   oxyCODONE (ROXICODONE) 15 mg, Oral, 2 times daily    Family  History  Problem Relation Age of Onset   Hypertension Mother    Heart disease Father    Crohn's disease Maternal Aunt    Colon cancer Paternal Grandfather     Social History:  reports that he has been smoking cigarettes. He has a 77.00 pack-year smoking history. He has never used smokeless tobacco. He reports that he does not drink alcohol and does not use drugs.   Exam: Current vital signs: BP 130/83   Pulse 64    Temp 98.4 F (36.9 C)   Resp 16   Ht 6' (1.829 m)   Wt 71 kg   SpO2 100%   BMI 21.23 kg/m  Vital signs in last 24 hours: Temp:  [98.4 F (36.9 C)-98.7 F (37.1 C)] 98.4 F (36.9 C) (05/03 2050) Pulse Rate:  [64-92] 64 (05/03 2050) Resp:  [16-18] 16 (05/03 2050) BP: (130-170)/(83-101) 130/83 (05/03 2050) SpO2:  [96 %-100 %] 100 % (05/03 2050) FiO2 (%):  [100 %] 100 % (05/03 2000) Weight:  [71 kg] 71 kg (05/03 1902)   Physical Exam  Constitutional: Appears well-developed and well-nourished.  Psych: Heavily sedated, not interactive, propofol only paused for exam Eyes: No scleral injection HENT: ETT, comfortable with heavy sedation MSK: no joint deformities.  Cardiovascular: Normal rate and regular rhythm.  Respiratory: Ventilated  GI: Soft.  No distension.  Skin: Warm dry and intact visible skin  Neuro: Mental Status: Heavily seda Cranial Nerves: II: Pupils small and reactive c/w fentanyl administration III,IV, VI: No VOR V/VII: No blink to eyelash brush VIII: No clear response to voice  X/XI: Strong cough Motor/Sensory: Withdraws to noxious stim in all four extremities, grossly equally   Deep Tendon Reflexes: 2+ and symmetric in the brachioradialis and patellae.  Cerebellar: Unable to assess secondary to patient's mental status  Gait:  Deferred as patient is intubated   NIHSS not valid in the setting of heavy sedation, see exam above   I have reviewed labs in epic and the results pertinent to this consultation are:  Basic Metabolic Panel: Recent Labs  Lab 07/12/22 1925 07/12/22 1927 07/12/22 2235  NA 145 146*  146* 145  K 2.8* 2.8*  2.8* 2.8*  CL 101 105  --   CO2 26  --   --   GLUCOSE 116* 117*  --   BUN 42* 41*  --   CREATININE 1.08 1.00  --   CALCIUM 9.9  --   --     CBC: Recent Labs  Lab 07/12/22 1925 07/12/22 1927 07/12/22 2235  WBC 17.2*  --   --   NEUTROABS 13.7*  --   --   HGB 16.4 17.0  16.7 14.3  HCT 49.4 50.0  49.0 42.0   MCV 91.5  --   --   PLT 416*  --   --     Coagulation Studies: No results for input(s): "LABPROT", "INR" in the last 72 hours.    I have reviewed the images obtained:  CT head w/o contrast personally reviewed, agree with radiology:   1. Suggestion of left occipital lobe acute to subacute infarction. Recommend MRI brain noncontrast for further evaluation. 2. No intracranial hemorrhage.  CTA chest/abdomen/pelvis w/ contrast 1. No acute intrathoracic, intra-abdominal normal or intrapelvic abnormality. 2. Other imaging findings of potential clinical significance: Persistent distal esophageal wall thickening. Correlate with signs and symptoms of esophagitis. Consider direct visualization.  Initial impression: Most likely a toxic/metabolic process given chronicity of symptoms and history provided however given emesis,  obtundation, will need to emergently rule out basilar process. Needs MRI to clarify extent of possible stroke given time course of symptoms and to assess risk of possible intervention so will proceed directly to MRI brain  Emergent recommendations:  - Given head CT findings concerning for possible stroke of the posterior circulation, MRI brain to evaluate for stroke - MRA head w/o if MRI brain is positive for acute stroke    Data reviewed:  MRI brain personally reviewed, agree with radiology:  No acute intracranial process. No evidence of acute or subacute infarct.   Further assessment:  MRI brain confirms CT findings were artifactual.  No further stroke workup is indicated.  Regarding his altered mental status, this is likely to be toxic/metabolic in the setting of his significant emesis recently  Overall, low concern for meningitis given reassuring MRI brain and clinical course as described by family as well as afebrile patient.  Potentially substance use related issues given his history.  However if his mental status is not improving with supportive care or if he  becomes febrile without other obvious source, he will need a lumbar puncture  Given thiamine levels can be depleted within 14 days, will also treat empirically with high-dose thiamine pending thiamine level collection  Further Recommendations: -No further stroke workup, no indication for antiplatelet agents from stroke prevention perspective -Routine EEG -Thiamine level added to primary team's toxic/metabolic workup -Empiric thiamine 500 mg every 8 hours, timed to start after level collected -Consider lumbar puncture if mental status is not improving with supportive care or if he becomes febrile without other source -Appreciate management of comorbidities per primary team  Brooke Dare MD-PhD [facility] [phone] Available 7 PM to 7 AM, outside of these hours please call Neurologist on call as listed on Amion.    Total critical care time: 60 minutes   Critical care time was exclusive of separately billable procedures and treating other patients.   Critical care was necessary to treat or prevent imminent or life-threatening deterioration and, concern for potential basilar occlusion and acute stroke requiring emergent evaluation   Critical care was time spent personally by me on the following activities: development of treatment plan with patient and/or surrogate as well as nursing, discussions with consultants/primary team, evaluation of patient's response to treatment, examination of patient, obtaining history from patient or surrogate, ordering and performing treatments and interventions, ordering and review of laboratory studies, ordering and review of radiographic studies, and re-evaluation of patient's condition as needed, as documented above.

## 2022-07-12 NOTE — ED Provider Notes (Signed)
Emergency Department Provider Note   I have reviewed the triage vital signs and the nursing notes.   HISTORY  Chief Complaint Altered Mental Status   HPI Brent Morales. is a 47 y.o. male past history reviewed below including pancreatitis presents to the emergency department for evaluation of continued somnolence, vomiting, weakness.  EMS arrive describing 4 days of those symptoms.  He apparently presented to the [facility] emergency department yesterday in similar state but ultimately was discharged.  In speaking with the patient's mom, she states that he has had bronchitis symptoms and productive cough over the past week which have gradually deteriorated into his current state.  She has noticed dark brown emesis.  Patient is not speaking and is having to be lifted and moved around the house by getting help from friends and neighbors.  She denies any active history of alcohol or drug abuse.  She does not have concerned that he may have taken something trying to harm himself.  No other new medications.  No falls.  Level 5 caveat: AMS   Past Medical History:  Diagnosis Date   Anxiety    Arthritis    Cervical stenosis of spinal canal    Depression    Fibromyalgia    Headache    Hernia    Bilateral Inguinal   Pancreatitis    Syncope    April 2016    Review of Systems  Level 5 caveat: AMS ____________________________________________   PHYSICAL EXAM:  VITAL SIGNS: ED Triage Vitals  Enc Vitals Group     BP 07/12/22 1907 (!) 170/101     Pulse Rate 07/12/22 1907 92     Resp 07/12/22 1907 18     Temp 07/12/22 1906 98.7 F (37.1 C)     Temp Source 07/12/22 1906 Oral     SpO2 07/12/22 1853 96 %     Weight 07/12/22 1902 156 lb 8.4 oz (71 kg)     Height 07/12/22 1902 6' (1.829 m)   Constitutional: Somnolent. Groaning with sternal rub and localizing to pain. Not following commands. Active vomiting on arrival.  Eyes: Conjunctivae are normal.  Head: Atraumatic. Nose:  No congestion/rhinnorhea. Mouth/Throat: Mucous membranes are moist.  Neck: No stridor.  Cardiovascular: Normal rate, regular rhythm. Good peripheral circulation. Grossly normal heart sounds.   Respiratory: Normal respiratory effort.  No retractions. Lungs CTAB. Gastrointestinal: Soft and nontender. No distention.  Musculoskeletal: No gross deformities of extremities. Neurologic: Appears encephalopathic with no clear localizing neuro findings but low GCS.  Skin:  Skin is warm, dry and intact. No rash noted.  ____________________________________________   LABS (all labs ordered are listed, but only abnormal results are displayed)  Labs Reviewed  COMPREHENSIVE METABOLIC PANEL - Abnormal; Notable for the following components:      Result Value   Potassium 2.8 (*)    Glucose, Bld 116 (*)    BUN 42 (*)    Total Protein 8.7 (*)    Anion gap 18 (*)    All other components within normal limits  CBC WITH DIFFERENTIAL/PLATELET - Abnormal; Notable for the following components:   WBC 17.2 (*)    Platelets 416 (*)    Neutro Abs 13.7 (*)    Monocytes Absolute 1.8 (*)    All other components within normal limits  RAPID URINE DRUG SCREEN, HOSP PERFORMED - Abnormal; Notable for the following components:   Benzodiazepines POSITIVE (*)    Tetrahydrocannabinol POSITIVE (*)    All other components within  normal limits  ACETAMINOPHEN LEVEL - Abnormal; Notable for the following components:   Acetaminophen (Tylenol), Serum <10 (*)    All other components within normal limits  SALICYLATE LEVEL - Abnormal; Notable for the following components:   Salicylate Lvl <7.0 (*)    All other components within normal limits  OCCULT BLOOD X 1 CARD TO LAB, STOOL - Abnormal; Notable for the following components:   Fecal Occult Bld POSITIVE (*)    All other components within normal limits  TRIGLYCERIDES - Abnormal; Notable for the following components:   Triglycerides 229 (*)    All other components within normal  limits  MAGNESIUM - Abnormal; Notable for the following components:   Magnesium 2.7 (*)    All other components within normal limits  URINALYSIS, ROUTINE W REFLEX MICROSCOPIC - Abnormal; Notable for the following components:   Specific Gravity, Urine >1.046 (*)    Hgb urine dipstick SMALL (*)    Ketones, ur 20 (*)    Protein, ur 30 (*)    All other components within normal limits  MAGNESIUM - Abnormal; Notable for the following components:   Magnesium 3.3 (*)    All other components within normal limits  COMPREHENSIVE METABOLIC PANEL - Abnormal; Notable for the following components:   Sodium 146 (*)    Potassium 2.9 (*)    BUN 40 (*)    All other components within normal limits  CBC - Abnormal; Notable for the following components:   WBC 14.0 (*)    All other components within normal limits  TRIGLYCERIDES - Abnormal; Notable for the following components:   Triglycerides 225 (*)    All other components within normal limits  AMMONIA - Abnormal; Notable for the following components:   Ammonia 44 (*)    All other components within normal limits  TSH - Abnormal; Notable for the following components:   TSH 0.321 (*)    All other components within normal limits  GLUCOSE, CAPILLARY - Abnormal; Notable for the following components:   Glucose-Capillary 105 (*)    All other components within normal limits  GLUCOSE, CAPILLARY - Abnormal; Notable for the following components:   Glucose-Capillary 108 (*)    All other components within normal limits  CBC WITH DIFFERENTIAL/PLATELET - Abnormal; Notable for the following components:   WBC 11.6 (*)    RBC 4.12 (*)    Hemoglobin 12.4 (*)    HCT 38.1 (*)    All other components within normal limits  COMPREHENSIVE METABOLIC PANEL - Abnormal; Notable for the following components:   Potassium 3.2 (*)    BUN 28 (*)    Calcium 8.3 (*)    Total Protein 6.0 (*)    Albumin 3.1 (*)    All other components within normal limits  MAGNESIUM -  Abnormal; Notable for the following components:   Magnesium 2.5 (*)    All other components within normal limits  I-STAT CHEM 8, ED - Abnormal; Notable for the following components:   Sodium 146 (*)    Potassium 2.8 (*)    BUN 41 (*)    Glucose, Bld 117 (*)    Calcium, Ion 1.09 (*)    All other components within normal limits  I-STAT VENOUS BLOOD GAS, ED - Abnormal; Notable for the following components:   pH, Ven 7.526 (*)    pCO2, Ven 36.3 (*)    pO2, Ven 49 (*)    Bicarbonate 30.0 (*)    Acid-Base Excess 7.0 (*)  Sodium 146 (*)    Potassium 2.8 (*)    Calcium, Ion 1.08 (*)    All other components within normal limits  I-STAT ARTERIAL BLOOD GAS, ED - Abnormal; Notable for the following components:   pH, Arterial 7.488 (*)    pO2, Arterial 236 (*)    Bicarbonate 28.5 (*)    Acid-Base Excess 5.0 (*)    Potassium 2.8 (*)    Calcium, Ion 1.13 (*)    All other components within normal limits  CULTURE, BLOOD (ROUTINE X 2)  CULTURE, BLOOD (ROUTINE X 2)  MRSA NEXT GEN BY PCR, NASAL  SARS CORONAVIRUS 2 BY RT PCR  SARS CORONAVIRUS 2 BY RT PCR  ETHANOL  LIPASE, BLOOD  LACTIC ACID, PLASMA  AMMONIA  RPR  HIV ANTIBODY (ROUTINE TESTING W REFLEX)  VITAMIN B12  FOLATE  GLUCOSE, CAPILLARY  PROCALCITONIN  GLUCOSE, CAPILLARY  GLUCOSE, CAPILLARY  PHOSPHORUS  GLUCOSE, CAPILLARY  GLUCOSE, CAPILLARY  GLUCOSE, CAPILLARY  GLUCOSE, CAPILLARY  BLOOD GAS, ARTERIAL  VITAMIN B1  GASTRIC OCCULT BLOOD (1-CARD TO LAB)  TROPONIN I (HIGH SENSITIVITY)  TROPONIN I (HIGH SENSITIVITY)   ____________________________________________  EKG   EKG Interpretation  Date/Time:  Friday Jul 12 2022 19:07:18 EDT Ventricular Rate:  92 PR Interval:  152 QRS Duration: 124 QT Interval:  396 QTC Calculation: 490 R Axis:   13 Text Interpretation: Sinus rhythm Right bundle branch block Inferior infarct, old Probable anterolateral infarct, old Confirmed by Alona Bene [phone]) on 07/12/2022 9:07:47 PM         ____________________________________________   PROCEDURES  Procedure(s) performed:   .Critical Care  Performed by: Maia Plan, MD Authorized by: Maia Plan, MD   Critical care provider statement:    Critical care time (minutes):  75   Critical care time was exclusive of:  Separately billable procedures and treating other patients and teaching time   Critical care was necessary to treat or prevent imminent or life-threatening deterioration of the following conditions:  CNS failure or compromise   Critical care was time spent personally by me on the following activities:  Development of treatment plan with patient or surrogate, discussions with consultants, evaluation of patient's response to treatment, examination of patient, ordering and review of laboratory studies, ordering and review of radiographic studies, ordering and performing treatments and interventions, pulse oximetry, re-evaluation of patient's condition, review of old charts, obtaining history from patient or surrogate, blood draw for specimens and ventilator management    ____________________________________________   INITIAL IMPRESSION / ASSESSMENT AND PLAN / ED COURSE  Pertinent labs & imaging results that were available during my care of the patient were reviewed by me and considered in my medical decision making (see chart for details).   This patient is Presenting for Evaluation of AMS, which does require a range of treatment options, and is a complaint that involves a high risk of morbidity and mortality.  The Differential Diagnoses includes but is not exclusive to alcohol, illicit or prescription medications, intracranial pathology such as stroke, intracerebral hemorrhage, fever or infectious causes including sepsis, hypoxemia, uremia, trauma, endocrine related disorders such as diabetes, hypoglycemia, thyroid-related diseases, etc.   Critical Interventions-    Medications  docusate (COLACE)  50 MG/5ML liquid 100 mg (has no administration in time range)  polyethylene glycol (MIRALAX / GLYCOLAX) packet 17 g (has no administration in time range)  pantoprazole (PROTONIX) injection 40 mg (40 mg Intravenous Given 07/14/22 0934)  acetaminophen (TYLENOL) tablet 650 mg (has no administration in  time range)  ondansetron (ZOFRAN) injection 4 mg (has no administration in time range)  hydrALAZINE (APRESOLINE) injection 10-40 mg (has no administration in time range)  Chlorhexidine Gluconate Cloth 2 % PADS 6 each (6 each Topical Given 07/13/22 2201)  Oral care mouth rinse (15 mLs Mouth Rinse Given 07/14/22 0935)  Oral care mouth rinse (has no administration in time range)  Chlorhexidine Gluconate Cloth 2 % PADS 6 each (6 each Topical Given 07/13/22 2201)  thiamine (VITAMIN B1) 500 mg in sodium chloride 0.9 % 50 mL IVPB (0 mg Intravenous Stopped 07/14/22 0621)  midazolam (VERSED) injection 1 mg (1 mg Intravenous Given 07/13/22 1626)  cefTRIAXone (ROCEPHIN) 2 g in sodium chloride 0.9 % 100 mL IVPB (0 g Intravenous Stopped 07/14/22 0633)  azithromycin (ZITHROMAX) 500 mg in sodium chloride 0.9 % 250 mL IVPB (500 mg Intravenous New Bag/Given 07/14/22 0938)  metoCLOPramide (REGLAN) injection 10 mg (10 mg Intravenous Given 07/14/22 0547)  enoxaparin (LOVENOX) injection 40 mg (40 mg Subcutaneous Given 07/14/22 0933)  lactated ringers infusion ( Intravenous Infusion Verify 07/14/22 0800)  potassium chloride 10 mEq in 100 mL IVPB (10 mEq Intravenous New Bag/Given 07/14/22 1038)  sertraline (ZOLOFT) tablet 100 mg (100 mg Per Tube Not Given 07/14/22 0936)  QUEtiapine (SEROQUEL) tablet 150 mg (has no administration in time range)  dexmedetomidine (PRECEDEX) 400 MCG/100ML (4 mcg/mL) infusion (0 mcg/kg/hr  90.7 kg Intravenous Hold 07/14/22 0809)  sodium chloride 0.9 % bolus 1,000 mL (0 mLs Intravenous Stopped 07/12/22 2235)  etomidate (AMIDATE) injection 20 mg (20 mg Intravenous Given 07/12/22 1954)  succinylcholine (ANECTINE) syringe 100  mg (140 mg Intravenous Given 07/12/22 2021)  etomidate (AMIDATE) injection (20 mg Intravenous Given 07/12/22 1954)  succinylcholine (ANECTINE) injection (140 mg Intravenous Given 07/12/22 1956)  midazolam PF (VERSED) 10 MG/2ML injection (5 mg  Given 07/12/22 2018)  iohexol (OMNIPAQUE) 350 MG/ML injection 75 mL (75 mLs Intravenous Contrast Given 07/12/22 2135)  pantoprazole (PROTONIX) 80 mg /NS 100 mL IVPB (0 mg Intravenous Stopped 07/13/22 0117)  magnesium sulfate IVPB 2 g 50 mL (0 g Intravenous Stopped 07/13/22 0224)  potassium chloride 10 mEq in 100 mL IVPB (0 mEq Intravenous Paused 07/13/22 0738)  Chlorhexidine Gluconate Cloth 2 % PADS 6 each (6 each Topical Given 07/13/22 0143)  potassium chloride 10 mEq in 100 mL IVPB (0 mEq Intravenous Stopped 07/13/22 1245)  potassium chloride (KLOR-CON) packet 20 mEq (20 mEq Per Tube Given 07/14/22 0651)  potassium chloride 10 mEq in 100 mL IVPB (0 mEq Intravenous Stopped 07/14/22 0753)    Reassessment after intervention: sedated well in the ED. Interfacing well with vent.    I did obtain Additional Historical Information from Mom at bedside, as the patient is unresponsive.  I decided to review pertinent External Data, and in summary ED visit yesterday. .   Clinical Laboratory Tests Ordered, included UDS pending. Mild leukocytosis. Etoh, salicylates, and acetaminophen negative. No AKI.   Radiologic Tests Ordered, included CT head, CXR. I independently interpreted the images and agree with radiology interpretation.   Cardiac Monitor Tracing which shows NSR.    Social Determinants of Health Risk patient is a smoker.   Consult complete with Radiology who after review of CT head has concern for occipital CVA. Recommends MRI.   Consulted with Neurology (Dr. Iver Nestle). Patient taken emergently to MRI given CT findings.   ICU: Plan fro admit.   Medical Decision Making: Summary:  Arrives to the emergency department encephalopathic with active vomiting.  I am concerned  that  he is not adequately protecting his airway and the decision was made, shortly after arrival, to intubate for airway protection.   Reevaluation with update and discussion with patient's Mom at bedside. Plan for admit to ICU and continued workup.   Patient's presentation is most consistent with acute presentation with potential threat to life or bodily function.   Disposition: admit  ____________________________________________  FINAL CLINICAL IMPRESSION(S) / ED DIAGNOSES  Final diagnoses:  Somnolence  Nausea and vomiting, unspecified vomiting type    Note:  This document was prepared using Dragon voice recognition software and may include unintentional dictation errors.  Alona Bene, MD, [facility]    Athony Coppa, Arlyss Repress, MD 07/14/22 1125

## 2022-07-12 NOTE — ED Provider Notes (Signed)
  [place] EMERGENCY DEPARTMENT AT [facility] Procedure Note   Procedure Note  Procedure Name: Intubation Date/Time: 07/12/2022 8:03 PM  Performed by: Curley Spice, MDPre-anesthesia Checklist: Patient identified Preoxygenation: Pre-oxygenation with 100% oxygen Induction Type: Rapid sequence Laryngoscope Size: Glidescope Grade View: Grade II Tube size: 7.5 mm Number of attempts: 1 Airway Equipment and Method: Video-laryngoscopy and Rigid stylet Placement Confirmation: ETT inserted through vocal cords under direct vision, Positive ETCO2, Breath sounds checked- equal and bilateral and CO2 detector Secured at: 25 cm Tube secured with: ETT holder Dental Injury: Teeth and Oropharynx as per pre-operative assessment        Curley Spice, MD 07/12/22 2004    Maia Plan, MD 07/14/22 1125

## 2022-07-12 NOTE — ED Triage Notes (Signed)
Was called out for AMS past 4 days failure to thrive with AMS N/V seen here yesterday but refused treatment so pt was d/c and they family stated that he has worsened. Ems reports coughing a lot .

## 2022-07-13 ENCOUNTER — Inpatient Hospital Stay (HOSPITAL_COMMUNITY): Payer: Medicare Other

## 2022-07-13 DIAGNOSIS — R569 Unspecified convulsions: Secondary | ICD-10-CM

## 2022-07-13 DIAGNOSIS — R4182 Altered mental status, unspecified: Secondary | ICD-10-CM

## 2022-07-13 DIAGNOSIS — G928 Other toxic encephalopathy: Secondary | ICD-10-CM | POA: Diagnosis not present

## 2022-07-13 DIAGNOSIS — R0689 Other abnormalities of breathing: Secondary | ICD-10-CM

## 2022-07-13 LAB — COMPREHENSIVE METABOLIC PANEL
ALT: 24 U/L (ref 0–44)
AST: 33 U/L (ref 15–41)
Albumin: 4 g/dL (ref 3.5–5.0)
Alkaline Phosphatase: 101 U/L (ref 38–126)
Anion gap: 14 (ref 5–15)
BUN: 40 mg/dL — ABNORMAL HIGH (ref 6–20)
CO2: 26 mmol/L (ref 22–32)
Calcium: 9.2 mg/dL (ref 8.9–10.3)
Chloride: 106 mmol/L (ref 98–111)
Creatinine, Ser: 1.06 mg/dL (ref 0.61–1.24)
GFR, Estimated: >60 mL/min (ref >60)
Glucose, Bld: 88 mg/dL (ref 70–99)
Potassium: 2.9 mmol/L — ABNORMAL LOW (ref 3.5–5.1)
Sodium: 146 mmol/L — ABNORMAL HIGH (ref 135–145)
Total Bilirubin: 0.9 mg/dL (ref 0.3–1.2)
Total Protein: 7.6 g/dL (ref 6.5–8.1)

## 2022-07-13 LAB — VITAMIN B12: Vitamin B-12: 406 pg/mL (ref 180–914)

## 2022-07-13 LAB — URINALYSIS, ROUTINE W REFLEX MICROSCOPIC
Bacteria, UA: NONE SEEN
Bilirubin Urine: NEGATIVE
Glucose, UA: NEGATIVE mg/dL
Ketones, ur: 20 mg/dL — AB
Leukocytes,Ua: NEGATIVE
Nitrite: NEGATIVE
Protein, ur: 30 mg/dL — AB
Specific Gravity, Urine: >1.046 — ABNORMAL HIGH (ref 1.005–1.030)
pH: 5 (ref 5.0–8.0)

## 2022-07-13 LAB — AMMONIA: Ammonia: 44 umol/L — ABNORMAL HIGH (ref 9–35)

## 2022-07-13 LAB — CBC
HCT: 45.1 % (ref 39.0–52.0)
Hemoglobin: 15.3 g/dL (ref 13.0–17.0)
MCH: 30.7 pg (ref 26.0–34.0)
MCHC: 33.9 g/dL (ref 30.0–36.0)
MCV: 90.6 fL (ref 80.0–100.0)
Platelets: 323 10*3/uL (ref 150–400)
RBC: 4.98 MIL/uL (ref 4.22–5.81)
RDW: 14.1 % (ref 11.5–15.5)
WBC: 14 10*3/uL — ABNORMAL HIGH (ref 4.0–10.5)
nRBC: 0 % (ref 0.0–0.2)

## 2022-07-13 LAB — GLUCOSE, CAPILLARY
Glucose-Capillary: 105 mg/dL — ABNORMAL HIGH (ref 70–99)
Glucose-Capillary: 98 mg/dL (ref 70–99)
Glucose-Capillary: 108 mg/dL — ABNORMAL HIGH (ref 70–99)
Glucose-Capillary: 84 mg/dL (ref 70–99)
Glucose-Capillary: 80 mg/dL (ref 70–99)
Glucose-Capillary: 94 mg/dL (ref 70–99)
Glucose-Capillary: 81 mg/dL (ref 70–99)

## 2022-07-13 LAB — FOLATE: Folate: 12.6 ng/mL (ref >5.9)

## 2022-07-13 LAB — TRIGLYCERIDES
Triglycerides: 229 mg/dL — ABNORMAL HIGH (ref <150)
Triglycerides: 225 mg/dL — ABNORMAL HIGH (ref <150)

## 2022-07-13 LAB — RPR: RPR Ser Ql: NONREACTIVE

## 2022-07-13 LAB — MRSA NEXT GEN BY PCR, NASAL: MRSA by PCR Next Gen: NOT DETECTED

## 2022-07-13 LAB — SARS CORONAVIRUS 2 BY RT PCR: SARS Coronavirus 2 by RT PCR: NEGATIVE

## 2022-07-13 LAB — MAGNESIUM: Magnesium: 3.3 mg/dL — ABNORMAL HIGH (ref 1.7–2.4)

## 2022-07-13 LAB — HIV ANTIBODY (ROUTINE TESTING W REFLEX): HIV Screen 4th Generation wRfx: NONREACTIVE

## 2022-07-13 LAB — PROCALCITONIN: Procalcitonin: <0.1 ng/mL

## 2022-07-13 LAB — TSH: TSH: 0.321 u[IU]/mL — ABNORMAL LOW (ref 0.350–4.500)

## 2022-07-13 MED ORDER — CHLORHEXIDINE GLUCONATE CLOTH 2 % EX PADS
6.0000 | MEDICATED_PAD | Freq: Every day | CUTANEOUS | Status: DC
  Administered 2022-07-13 – 2022-07-16 (×3): 6 via TOPICAL

## 2022-07-13 MED ORDER — CHLORHEXIDINE GLUCONATE CLOTH 2 % EX PADS
6.0000 | MEDICATED_PAD | Freq: Once | CUTANEOUS | Status: AC
  Administered 2022-07-13: 6 via TOPICAL

## 2022-07-13 MED ORDER — MIDAZOLAM HCL 2 MG/2ML IJ SOLN
1.0000 mg | INTRAMUSCULAR | Status: DC | PRN
  Administered 2022-07-13 (×4): 1 mg via INTRAVENOUS
  Filled 2022-07-13 (×4): qty 2

## 2022-07-13 MED ORDER — ENOXAPARIN SODIUM 40 MG/0.4ML IJ SOSY
40.0000 mg | PREFILLED_SYRINGE | Freq: Every day | INTRAMUSCULAR | Status: DC
  Administered 2022-07-13 – 2022-07-17 (×5): 40 mg via SUBCUTANEOUS
  Filled 2022-07-13 (×5): qty 0.4

## 2022-07-13 MED ORDER — MIRTAZAPINE 15 MG PO TABS
30.0000 mg | ORAL_TABLET | Freq: Every day | ORAL | Status: DC
  Administered 2022-07-13: 30 mg
  Filled 2022-07-13: qty 2

## 2022-07-13 MED ORDER — METOCLOPRAMIDE HCL 5 MG/ML IJ SOLN
10.0000 mg | Freq: Four times a day (QID) | INTRAMUSCULAR | Status: DC
  Administered 2022-07-13 – 2022-07-15 (×9): 10 mg via INTRAVENOUS
  Filled 2022-07-13 (×9): qty 2

## 2022-07-13 MED ORDER — ORAL CARE MOUTH RINSE: 15.0000 mL | OROMUCOSAL | Status: DC | PRN

## 2022-07-13 MED ORDER — SODIUM CHLORIDE 0.9 % IV SOLN
2.0000 g | INTRAVENOUS | Status: AC
  Administered 2022-07-13 – 2022-07-17 (×5): 2 g via INTRAVENOUS
  Filled 2022-07-13 (×5): qty 20

## 2022-07-13 MED ORDER — THIAMINE HCL 100 MG/ML IJ SOLN
500.0000 mg | Freq: Three times a day (TID) | INTRAVENOUS | Status: AC
  Administered 2022-07-13 – 2022-07-15 (×9): 500 mg via INTRAVENOUS
  Filled 2022-07-13 (×9): qty 5

## 2022-07-13 MED ORDER — CHLORHEXIDINE GLUCONATE CLOTH 2 % EX PADS
6.0000 | MEDICATED_PAD | Freq: Every day | CUTANEOUS | Status: DC
  Administered 2022-07-13 – 2022-07-15 (×3): 6 via TOPICAL

## 2022-07-13 MED ORDER — SODIUM CHLORIDE 0.9 % IV SOLN
500.0000 mg | INTRAVENOUS | Status: DC
  Administered 2022-07-13 – 2022-07-14 (×2): 500 mg via INTRAVENOUS
  Filled 2022-07-13 (×2): qty 5

## 2022-07-13 MED ORDER — POTASSIUM CHLORIDE 10 MEQ/100ML IV SOLN
10.0000 meq | INTRAVENOUS | Status: AC
  Administered 2022-07-13 (×4): 10 meq via INTRAVENOUS
  Filled 2022-07-13 (×4): qty 100

## 2022-07-13 MED ORDER — RINGERS IV SOLN: INTRAVENOUS | Status: DC

## 2022-07-13 MED ORDER — ORAL CARE MOUTH RINSE
15.0000 mL | OROMUCOSAL | Status: DC
  Administered 2022-07-13 – 2022-07-14 (×22): 15 mL via OROMUCOSAL

## 2022-07-13 NOTE — Plan of Care (Signed)
Progressing toward goals. Hopefully he will be successfully weaned off sedatives/IV narcotics and extubated tomorrow with PO diet and d/c restraints.

## 2022-07-13 NOTE — Progress Notes (Signed)
EEG complete - results pending 

## 2022-07-13 NOTE — Progress Notes (Signed)
NAME:  Brent Morales., MRN:  [id number], DOB:  03/05/1976, LOS: 1 ADMISSION DATE:  07/12/2022, CONSULTATION DATE:  5/3 REFERRING MD:  Dr. Jacqulyn Bath, CHIEF COMPLAINT:  Stroke   History of Present Illness:  Patient is a 47 year old male with pertinent PMH anxiety/depression, cervical stenosis, pancreatitis presents to [facility] ED on 5/3 with AMS.  Over the past 4 days, patient has been having increased somnolence and weakness associated associated w/ cold like symptoms and low grade fever 99.5 F.  Per mother he has been coughing up brown sputum. Patient also having episodes of N/V.  On 5/2 patient went to [facility] ED but patient refused work up and was discharged.   Patient came back to [facility] on 5/3 for worsening AMS. BP 170/101. Afebrile. Patient altered and localizing w/ LUE>RUE. Patient throwing up coffee ground emesis and not protecting airway requiring intubation. Ethanol, acetaminophen, salicylate level wnl. Ammonia 35. UDS pending. 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 Neuro consulted. MRI ordered. CT chest/abd/pelvis w/ no acute abnormality; distal esophageal wall thickening likely esophagitis. Ifob positive, hgb 16.4. Patient given Protonix bolus. PCCM consulted for icu admission.  Pertinent ed labs: K 2.8, glucose 116, wbc 17.2, la 1.5  Pertinent  Medical History   Past Medical History:  Diagnosis Date   Anxiety    Arthritis    Cervical stenosis of spinal canal    Depression    Fibromyalgia    Headache    Hernia    Bilateral Inguinal   Pancreatitis    Syncope    April 2016     Significant Hospital Events: Including procedures, antibiotic start and stop dates in addition to other pertinent events   5/3 patient altered; intubated for airway protection; ct head showing posterior circulation stroke.  Interim History / Subjective:  Patient was agitated, trying to get out of bed Still with large amount of gastric output via G-tube Remained afebrile  Objective   Blood pressure (!) 142/92,  pulse 66, temperature (!) 97.5 F (36.4 C), resp. rate 16, height 6' (1.829 m), weight 90.7 kg, SpO2 100 %.    Vent Mode: PRVC FiO2 (%):  [40 %-100 %] 40 % Set Rate:  [16 bmp] 16 bmp Vt Set:  [620 mL] 620 mL PEEP:  [5 cmH20] 5 cmH20 Plateau Pressure:  [19 cmH20] 19 cmH20   Intake/Output Summary (Last 24 hours) at 07/13/2022 0724 Last data filed at 07/13/2022 6213 Gross per 24 hour  Intake 2334.25 ml  Output 210 ml  Net 2124.25 ml   Filed Weights   07/12/22 1902 07/13/22 0400  Weight: 71 kg 90.7 kg    Examination: Physical exam: General: Crtitically ill-appearing male, orally intubated, trying to get out of bed, agitated HEENT: [place]/AT, eyes anicteric.  ETT and OGT in place Neuro: Eyes closed, does not open, following commands, moving all 4 extremities spontaneously Chest: Coarse breath sounds, no wheezes or rhonchi Heart: Regular rate and rhythm, no murmurs or gallops Abdomen: Soft, nondistended, bowel sounds present Skin: No rash  Labs and images were reviewed  Resolved Hospital Problem list     Assessment & Plan:  L occipital stroke was ruled out CT head: Showed suspicion of acute/subacute L occipital lobe stroke, MRI brain ruled out acute stroke Appreciate neurology consult  Acute toxic encephalopathy: In the setting of THC and multiple anxiety medications and oxycodone  Patient is agitated on mechanical ventilator Ethanol, ammonia, salicylate/acetaminophen level negative UDS showed THC and benzos Continue neuro watch Try to minimize sedation, currently  on propofol and fentanyl Resume mirtazapine Hold duloxetine and gabapentin  Acute respiratory insufficiency in the setting of frequent vomiting Continue lung protective ventilation VAP prevention bundle in place PAD protocol with propofol and fentanyl with RASS goal -1 Patient is tolerating spontaneous breathing trial but he is agitated and still vomiting, will hold off on SBT and extubation  Acute  gastroenteritis Patient presented with frequent vomiting, CT abdomen pelvis is suggestive of esophagitis Continue Protonix Continue Reglan Continue OGT on low wall suction Continue IV fluid  Hypokalemia In the setting of frequent vomiting Continue aggressive electrolyte supplement and monitor  Probable Mallory-Weiss tear Patient came with frequent vomiting FOBT is positive Continue Protonix twice daily Continue Zofran and Reglan Monitor H&H  Anxiety/depression Fibromyalgia Resume mirtazapine, hold gabapentin and duloxetine  Best Practice (right click and "Reselect all SmartList Selections" daily)   Diet/type: NPO DVT prophylaxis: Subcu Lovenox GI prophylaxis: PPI Lines: N/A Foley:  N/A Code Status:  full code Last date of multidisciplinary goals of care discussion [5/3 talked with mother at bedside and updated]  Labs   CBC: Recent Labs  Lab 07/12/22 1925 07/12/22 1927 07/12/22 2235 07/13/22 0220  WBC 17.2*  --   --  14.0*  NEUTROABS 13.7*  --   --   --   HGB 16.4 17.0  16.7 14.3 15.3  HCT 49.4 50.0  49.0 42.0 45.1  MCV 91.5  --   --  90.6  PLT 416*  --   --  323    Basic Metabolic Panel: Recent Labs  Lab 07/12/22 1925 07/12/22 1927 07/12/22 2203 07/12/22 2235 07/13/22 0220  NA 145 146*  146*  --  145 146*  K 2.8* 2.8*  2.8*  --  2.8* 2.9*  CL 101 105  --   --  106  CO2 26  --   --   --  26  GLUCOSE 116* 117*  --   --  88  BUN 42* 41*  --   --  40*  CREATININE 1.08 1.00  --   --  1.06  CALCIUM 9.9  --   --   --  9.2  MG  --   --  2.7*  --  3.3*   GFR: Estimated Creatinine Clearance: 96.6 mL/min (by C-G formula based on SCr of 1.06 mg/dL). Recent Labs  Lab 07/12/22 1925 07/13/22 0220 07/13/22 0611  PROCALCITON  --   --  <0.10  WBC 17.2* 14.0*  --   LATICACIDVEN 1.5  --   --     Liver Function Tests: Recent Labs  Lab 07/12/22 1925 07/13/22 0220  AST 35 33  ALT 24 24  ALKPHOS 116 101  BILITOT 0.8 0.9  PROT 8.7* 7.6  ALBUMIN 4.6  4.0   Recent Labs  Lab 07/12/22 1925  LIPASE 38   Recent Labs  Lab 07/12/22 2057 07/13/22 0220  AMMONIA 35 44*    ABG    Component Value Date/Time   PHART 7.488 (H) 07/12/2022 2235   PCO2ART 37.7 07/12/2022 2235   PO2ART 236 (H) 07/12/2022 2235   HCO3 28.5 (H) 07/12/2022 2235   TCO2 30 07/12/2022 2235   O2SAT 100 07/12/2022 2235     Coagulation Profile: No results for input(s): "INR", "PROTIME" in the last 168 hours.  Cardiac Enzymes: No results for input(s): "CKTOTAL", "CKMB", "CKMBINDEX", "TROPONINI" in the last 168 hours.  HbA1C: No results found for: "HGBA1C"  CBG: Recent Labs  Lab 07/13/22 0117 07/13/22 0351  GLUCAP 105* 98  This patient is critically ill with multiple organ system failure which requires frequent high complexity decision making, assessment, support, evaluation, and titration of therapies. This was completed through the application of advanced monitoring technologies and extensive interpretation of multiple databases.  During this encounter critical care time was devoted to patient care services described in this note for 39 minutes.    Cheri Fowler, MD [facility] See Amion for pager If no response to pager, please call [phone] until 7pm After 7pm, Please call E-link [phone]

## 2022-07-13 NOTE — Progress Notes (Signed)
Neurology Progress Note   S:// Seen and examined   O:// Current vital signs: BP (!) 140/86   Pulse 60   Temp 97.9 F (36.6 C)   Resp 16   Ht 6' (1.829 m)   Wt 90.7 kg   SpO2 100%   BMI 27.12 kg/m  Vital signs in last 24 hours: Temp:  [97.5 F (36.4 C)-99.8 F (37.7 C)] 97.9 F (36.6 C) (05/04 0900) Pulse Rate:  [46-92] 60 (05/04 0900) Resp:  [16-18] 16 (05/04 0900) BP: (108-170)/(68-105) 140/86 (05/04 0900) SpO2:  [96 %-100 %] 100 % (05/04 0900) FiO2 (%):  [40 %-100 %] 40 % (05/04 0830) Weight:  [71 kg-90.7 kg] 90.7 kg (05/04 0400) General: Sedated and intubated.  Sedation with propofol held for exam HNT: Normocephalic atraumatic Lungs: Clear Cardiovascular: Regular rhythm Neurological exam Sedated intubated Sedation held for exam Upon holding sedation, awakens to voice. Nods yes and no appropriately to questions Unable to assess speech due to the endotracheal tube in place. Cranial nerves II through XII appear intact Motor examination with antigravity strength in upper extremities.  Wiggling toes to command in bilateral lower extremities. Sensation appears intact to light touch Coordination difficult to assess  Medications  Current Facility-Administered Medications:    acetaminophen (TYLENOL) tablet 650 mg, 650 mg, Per Tube, Q4H PRN, Lidia Collum, PA-C   azithromycin (ZITHROMAX) 500 mg in sodium chloride 0.9 % 250 mL IVPB, 500 mg, Intravenous, Q24H, Charlotte Sanes, MD, Stopped at 07/13/22 0710   cefTRIAXone (ROCEPHIN) 2 g in sodium chloride 0.9 % 100 mL IVPB, 2 g, Intravenous, Q24H, Charlotte Sanes, MD, Stopped at 07/13/22 0604   Chlorhexidine Gluconate Cloth 2 % PADS 6 each, 6 each, Topical, Daily, Charlotte Sanes, MD, 6 each at 07/13/22 0013   Chlorhexidine Gluconate Cloth 2 % PADS 6 each, 6 each, Topical, QHS, Paliwal, Aditya, MD   docusate (COLACE) 50 MG/5ML liquid 100 mg, 100 mg, Per Tube, BID PRN, Pia Mau D, PA-C   enoxaparin (LOVENOX) injection  40 mg, 40 mg, Subcutaneous, Daily, Chand, Garnet Sierras, MD, 40 mg at 07/13/22 0829   fentaNYL (SUBLIMAZE) bolus via infusion 50-100 mcg, 50-100 mcg, Intravenous, Q15 min PRN, Lidia Collum, PA-C, 100 mcg at 07/13/22 0815   fentaNYL in NS (64mcg/ml) infusion-PREMIX, 50-200 mcg/hr, Intravenous, Continuous, Payne, John D, PA-C, Last Rate: 20 mL/hr at 07/13/22 0800, 200 mcg/hr at 07/13/22 0800   hydrALAZINE (APRESOLINE) injection 10-40 mg, 10-40 mg, Intravenous, Q4H PRN, Pia Mau D, PA-C   metoCLOPramide (REGLAN) injection 10 mg, 10 mg, Intravenous, Q6H, Chand, Sudham, MD, 10 mg at 07/13/22 0826   midazolam (VERSED) injection 1 mg, 1 mg, Intravenous, Q1H PRN, Charlotte Sanes, MD, 1 mg at 07/13/22 0846   mirtazapine (REMERON) tablet 30 mg, 30 mg, Per Tube, QHS, Chand, Garnet Sierras, MD   ondansetron (ZOFRAN) injection 4 mg, 4 mg, Intravenous, Q6H PRN, Lidia Collum, PA-C   Oral care mouth rinse, 15 mL, Mouth Rinse, Q2H, Charlotte Sanes, MD, 15 mL at 07/13/22 1610   Oral care mouth rinse, 15 mL, Mouth Rinse, PRN, Charlotte Sanes, MD   pantoprazole (PROTONIX) injection 40 mg, 40 mg, Intravenous, Q12H, Lidia Collum, PA-C, 40 mg at 07/13/22 9604   polyethylene glycol (MIRALAX / GLYCOLAX) packet 17 g, 17 g, Per Tube, Daily PRN, Pia Mau D, PA-C   potassium chloride 10 mEq in 100 mL IVPB, 10 mEq, Intravenous, Q1 Hr x 4, Chand, Sudham, MD, Last Rate: 100 mL/hr at 07/13/22 0923, 10 mEq at  07/13/22 0923   propofol (DIPRIVAN) 1000 MG/100ML infusion, 0-50 mcg/kg/min, Intravenous, Continuous, Lidia Collum, PA-C, Last Rate: 21.3 mL/hr at 07/13/22 0851, 50 mcg/kg/min at 07/13/22 0851   ringers solution, , Intravenous, Continuous, Cheri Fowler, MD, Last Rate: 125 mL/hr at 07/13/22 0832, New Bag at 07/13/22 8295   thiamine (VITAMIN B1) 500 mg in sodium chloride 0.9 % 50 mL IVPB, 500 mg, Intravenous, Q8H, Bhagat, Srishti L, MD, Stopped at 07/13/22 0652 Labs CBC    Component Value Date/Time   WBC 14.0 (H)  07/13/2022 0220   RBC 4.98 07/13/2022 0220   HGB 15.3 07/13/2022 0220   HCT 45.1 07/13/2022 0220   PLT 323 07/13/2022 0220   MCV 90.6 07/13/2022 0220   MCV 89.2 04/03/2015 1738   MCH 30.7 07/13/2022 0220   MCHC 33.9 07/13/2022 0220   RDW 14.1 07/13/2022 0220   LYMPHSABS 1.7 07/12/2022 1925   MONOABS 1.8 (H) 07/12/2022 1925   EOSABS 0.0 07/12/2022 1925   BASOSABS 0.0 07/12/2022 1925    CMP     Component Value Date/Time   NA 146 (H) 07/13/2022 0220   NA 141 07/20/2013 1356   K 2.9 (L) 07/13/2022 0220   K 3.8 07/20/2013 1356   CL 106 07/13/2022 0220   CL 107 07/20/2013 1356   CO2 26 07/13/2022 0220   CO2 26 07/20/2013 1356   GLUCOSE 88 07/13/2022 0220   GLUCOSE 93 07/20/2013 1356   BUN 40 (H) 07/13/2022 0220   BUN 4 (L) 07/20/2013 1356   CREATININE 1.06 07/13/2022 0220   CREATININE 0.84 04/03/2015 1808   CALCIUM 9.2 07/13/2022 0220   CALCIUM 8.7 07/20/2013 1356   PROT 7.6 07/13/2022 0220   PROT 7.7 07/20/2013 1356   ALBUMIN 4.0 07/13/2022 0220   ALBUMIN 3.8 07/20/2013 1356   AST 33 07/13/2022 0220   AST 17 07/20/2013 1356   ALT 24 07/13/2022 0220   ALT 17 07/20/2013 1356   ALKPHOS 101 07/13/2022 0220   ALKPHOS 110 07/20/2013 1356   BILITOT 0.9 07/13/2022 0220   BILITOT 0.4 07/20/2013 1356   GFRNONAA >60 07/13/2022 0220   GFRNONAA >60 07/20/2013 1356   GFRAA >60 12/31/2016 2108   GFRAA >60 07/20/2013 1356     Lipid Panel     Component Value Date/Time   TRIG 225 (H) 07/13/2022 6213   Urinary toxicology screen positive for benzodiazepines and THC.  Imaging I have reviewed images in epic and the results pertinent to this consultation are: MRI brain-NAICP  Assessment: 47 year old presenting for evaluation of symptoms of lethargy and vomiting requiring intubation with concern for underlying stroke versus seizure.  MRI brain was done stat which did not reveal any acute findings.  On multiple medications for anxiety as well as oxycodone plus extensive THC abuse.   Question if he had cyclical vomiting leading to acute respiratory insufficiency due to aspiration etc. Most likely toxic metabolic process given chronicity.  Low suspicion for CNS infection.   Impression:  Toxic metabolic encephalopathy  Recommendations: Continue supportive care per primary team as you are Routine EEG completed-will follow the read Aggressive thiamine replenishment-Wernicke's protocol given preceding vomiting could lead to  thiamine deficiency. Will repeat exam and extubated if desired by the critical care service. Low suspicion for CNS infection-do not see a need for LP at this time.  -- Milon Dikes, MD Neurologist [facility] Pager: [phone]  CRITICAL CARE ATTESTATION Performed by: Milon Dikes, MD Total critical care time: 30 minutes Critical care time was exclusive of  separately billable procedures and treating other patients and/or supervising APPs/Residents/Students Critical care was necessary to treat or prevent imminent or life-threatening deterioration. This patient is critically ill and at significant risk for neurological worsening and/or death and care requires constant monitoring. Critical care was time spent personally by me on the following activities: development of treatment plan with patient and/or surrogate as well as nursing, discussions with consultants, evaluation of patient's response to treatment, examination of patient, obtaining history from patient or surrogate, ordering and performing treatments and interventions, ordering and review of laboratory studies, ordering and review of radiographic studies, pulse oximetry, re-evaluation of patient's condition, participation in multidisciplinary rounds and medical decision making of high complexity in the care of this patient.

## 2022-07-13 NOTE — Progress Notes (Addendum)
Procedures with the exception of eLink Physician-Brief Progress Note Patient Name: Brent Morales. DOB: [birth year] MRN: [id number]   Date of Service  07/13/2022  HPI/Events of Note  47 year old male with a history of anxiety and depression with cervical stenosis and pancreatitis set presents to the hospital with acute encephalopathy.  Has had worsening somnolence, low-grade fevers, and coffee ground emesis. He was intubated for airway protection.  Initially hypertensive but now normotensive and bradycardic. On propofol and Fentanyl.   ABG with hypoxia. CMP with hypokalemia, CBC with leukocytosis, metabolic workup unremarkable. UDS + benzos and THC.   CT head with suspected Occipital infarct. MRI with no acute processes.   EKG with RBBB.   eICU Interventions  Initially suspected left occipital stroke although MRI does not confirm this.  Continue neurochecks, dual antiplatelet therapy.  Will add Ammonia. TSH, B12, folic acid testing for metabolic encephalopathy workup  Stable on the ventilator.  SCDs for DVT prophylaxis, PPI twice daily in the setting of potential GI bleed.  Adequately sedated, no immediate intervention required.   0300 -restraints ordered   Intervention Category Evaluation Type: New Patient Evaluation  Brent Morales 07/13/2022, 12:54 AM

## 2022-07-13 NOTE — Procedures (Signed)
Patient Name: Woodward Teply.  MRN: [id number]  Epilepsy Attending: Charlsie Quest  Referring Physician/Provider: Gordy Councilman, MD  Date: 07/13/2022 Duration: 22.16 mins  Patient history: 46yo M with ams getting eeg to evaluate for seizure  Level of alertness: Awake  AEDs during EEG study: propofol  Technical aspects: This EEG study was done with scalp electrodes positioned according to the 10-20 International system of electrode placement. Electrical activity was reviewed with band pass filter of 1-70Hz , sensitivity of 7 uV/mm, display speed of 57mm/sec with a 60Hz  notched filter applied as appropriate. EEG data were recorded continuously and digitally stored.  Video monitoring was available and reviewed as appropriate.  Description: The posterior dominant rhythm consists of 7.5 Hz activity of moderate voltage (25-35 uV) seen predominantly in posterior head regions, symmetric and reactive to eye opening and eye closing. EEG showed intermittent generalized predominantly 5 to 7 Hz theta slowing admixed with 2-3hz  delta slowing. Hyperventilation and photic stimulation were not performed.     ABNORMALITY - Intermittent slow, generalized  IMPRESSION: This study is suggestive of mild diffuse encephalopathy, nonspecific etiology. No seizures or epileptiform discharges were seen throughout the recording.  Finnian Husted Annabelle Harman

## 2022-07-13 NOTE — ED Notes (Signed)
Temperature sensing catheter removed in MRI as the techs were adamant it could not stay in place. I questioned this, but was told definitively it could not stay. Upon arrival in ICU, they questioned why catheter was removed. I advised I was told he could not have it in MRI and was advised that was inaccurate.

## 2022-07-14 DIAGNOSIS — F1910 Other psychoactive substance abuse, uncomplicated: Secondary | ICD-10-CM

## 2022-07-14 LAB — CBC WITH DIFFERENTIAL/PLATELET
Abs Immature Granulocytes: 0.02 10*3/uL (ref 0.00–0.07)
Basophils Absolute: 0 10*3/uL (ref 0.0–0.1)
Basophils Relative: 0 %
Eosinophils Absolute: 0.1 10*3/uL (ref 0.0–0.5)
Eosinophils Relative: 1 %
HCT: 38.1 % — ABNORMAL LOW (ref 39.0–52.0)
Hemoglobin: 12.4 g/dL — ABNORMAL LOW (ref 13.0–17.0)
Immature Granulocytes: 0 %
Lymphocytes Relative: 24 %
Lymphs Abs: 2.8 10*3/uL (ref 0.7–4.0)
MCH: 30.1 pg (ref 26.0–34.0)
MCHC: 32.5 g/dL (ref 30.0–36.0)
MCV: 92.5 fL (ref 80.0–100.0)
Monocytes Absolute: 1 10*3/uL (ref 0.1–1.0)
Monocytes Relative: 9 %
Neutro Abs: 7.7 10*3/uL (ref 1.7–7.7)
Neutrophils Relative %: 66 %
Platelets: 232 10*3/uL (ref 150–400)
RBC: 4.12 MIL/uL — ABNORMAL LOW (ref 4.22–5.81)
RDW: 14 % (ref 11.5–15.5)
WBC: 11.6 10*3/uL — ABNORMAL HIGH (ref 4.0–10.5)
nRBC: 0 % (ref 0.0–0.2)

## 2022-07-14 LAB — COMPREHENSIVE METABOLIC PANEL
ALT: 20 U/L (ref 0–44)
AST: 23 U/L (ref 15–41)
Albumin: 3.1 g/dL — ABNORMAL LOW (ref 3.5–5.0)
Alkaline Phosphatase: 81 U/L (ref 38–126)
Anion gap: 6 (ref 5–15)
BUN: 28 mg/dL — ABNORMAL HIGH (ref 6–20)
CO2: 25 mmol/L (ref 22–32)
Calcium: 8.3 mg/dL — ABNORMAL LOW (ref 8.9–10.3)
Chloride: 111 mmol/L (ref 98–111)
Creatinine, Ser: 0.95 mg/dL (ref 0.61–1.24)
GFR, Estimated: >60 mL/min (ref >60)
Glucose, Bld: 81 mg/dL (ref 70–99)
Potassium: 3.2 mmol/L — ABNORMAL LOW (ref 3.5–5.1)
Sodium: 142 mmol/L (ref 135–145)
Total Bilirubin: 1 mg/dL (ref 0.3–1.2)
Total Protein: 6 g/dL — ABNORMAL LOW (ref 6.5–8.1)

## 2022-07-14 LAB — CULTURE, BLOOD (ROUTINE X 2): Culture: NO GROWTH

## 2022-07-14 LAB — GLUCOSE, CAPILLARY
Glucose-Capillary: 80 mg/dL (ref 70–99)
Glucose-Capillary: 85 mg/dL (ref 70–99)
Glucose-Capillary: 78 mg/dL (ref 70–99)
Glucose-Capillary: 103 mg/dL — ABNORMAL HIGH (ref 70–99)
Glucose-Capillary: 102 mg/dL — ABNORMAL HIGH (ref 70–99)

## 2022-07-14 LAB — PHOSPHORUS: Phosphorus: 3 mg/dL (ref 2.5–4.6)

## 2022-07-14 LAB — MAGNESIUM: Magnesium: 2.5 mg/dL — ABNORMAL HIGH (ref 1.7–2.4)

## 2022-07-14 MED ORDER — SERTRALINE HCL 100 MG PO TABS
100.0000 mg | ORAL_TABLET | Freq: Every day | ORAL | Status: DC
  Administered 2022-07-15 – 2022-07-16 (×2): 100 mg via ORAL
  Filled 2022-07-14 (×2): qty 2

## 2022-07-14 MED ORDER — SERTRALINE HCL 50 MG PO TABS: 100.0000 mg | ORAL_TABLET | Freq: Every day | ORAL | Status: DC

## 2022-07-14 MED ORDER — LACTATED RINGERS IV SOLN: INTRAVENOUS | Status: DC

## 2022-07-14 MED ORDER — DOCUSATE SODIUM 100 MG PO CAPS
100.0000 mg | ORAL_CAPSULE | Freq: Two times a day (BID) | ORAL | Status: DC | PRN
  Administered 2022-07-16: 100 mg via ORAL
  Filled 2022-07-14: qty 1

## 2022-07-14 MED ORDER — POTASSIUM CHLORIDE 10 MEQ/100ML IV SOLN
10.0000 meq | INTRAVENOUS | Status: AC
  Administered 2022-07-14 (×4): 10 meq via INTRAVENOUS
  Filled 2022-07-14 (×4): qty 100

## 2022-07-14 MED ORDER — POLYETHYLENE GLYCOL 3350 17 G PO PACK: 17.0000 g | PACK | Freq: Every day | ORAL | Status: DC | PRN

## 2022-07-14 MED ORDER — AZITHROMYCIN 500 MG PO TABS
500.0000 mg | ORAL_TABLET | Freq: Every day | ORAL | Status: DC
  Administered 2022-07-15 – 2022-07-16 (×2): 500 mg via ORAL
  Filled 2022-07-14 (×2): qty 1

## 2022-07-14 MED ORDER — POTASSIUM CHLORIDE 10 MEQ/100ML IV SOLN
10.0000 meq | INTRAVENOUS | Status: AC
  Administered 2022-07-14 (×4): 10 meq via INTRAVENOUS
  Filled 2022-07-14 (×3): qty 100

## 2022-07-14 MED ORDER — QUETIAPINE FUMARATE 25 MG PO TABS: 150.0000 mg | ORAL_TABLET | Freq: Every day | ORAL | Status: DC

## 2022-07-14 MED ORDER — ORAL CARE MOUTH RINSE
15.0000 mL | OROMUCOSAL | Status: DC
  Administered 2022-07-15: 15 mL via OROMUCOSAL

## 2022-07-14 MED ORDER — DEXMEDETOMIDINE HCL IN NACL 400 MCG/100ML IV SOLN: 0.0000 ug/kg/h | INTRAVENOUS | Status: DC

## 2022-07-14 MED ORDER — HYDROXYZINE HCL 25 MG PO TABS
25.0000 mg | ORAL_TABLET | Freq: Every evening | ORAL | Status: DC | PRN
  Administered 2022-07-15 – 2022-07-16 (×2): 25 mg via ORAL
  Filled 2022-07-14 (×2): qty 1

## 2022-07-14 MED ORDER — QUETIAPINE FUMARATE 50 MG PO TABS
150.0000 mg | ORAL_TABLET | Freq: Every day | ORAL | Status: DC
  Administered 2022-07-14 – 2022-07-16 (×3): 150 mg via ORAL
  Filled 2022-07-14: qty 2
  Filled 2022-07-14: qty 1
  Filled 2022-07-14: qty 2

## 2022-07-14 MED ORDER — POTASSIUM CHLORIDE 20 MEQ PO PACK
20.0000 meq | PACK | ORAL | Status: AC
  Administered 2022-07-14 (×2): 20 meq
  Filled 2022-07-14: qty 1

## 2022-07-14 MED ORDER — ACETAMINOPHEN 325 MG PO TABS
650.0000 mg | ORAL_TABLET | ORAL | Status: DC | PRN
  Administered 2022-07-14 – 2022-07-16 (×2): 650 mg via ORAL
  Filled 2022-07-14 (×2): qty 2

## 2022-07-14 NOTE — Progress Notes (Signed)
The patient had a couple of episodes overnight of raising his head off the bed and sitting up in the bed. He seemed to be hanging his head down so he could self extubate but no aggressive movements to this end. B/l wrist restraints and mittens so I put a pillow over the restrained arm and let him do this while I watched him closely. He would last about 15-20 minutes before laying back down. The patient this AM appears to not allow me to open his eyes for exam/pupil check. He forces them closed like there is soap in his eyes. I cleansed his eyelashes. Otherwise uneventful night. Unable to wean the fentanyl and propofol gtts both at max dose but at least I did not have to give any boluses.   Minimal output despite LR at 136ml/hr but at least his BUN is improving. 23ml/hr u/o.

## 2022-07-14 NOTE — Progress Notes (Signed)
[facility] ADULT ICU REPLACEMENT PROTOCOL   The patient does apply for the [facility] Adult ICU Electrolyte Replacment Protocol based on the criteria listed below:   1.Exclusion criteria: TCTS, ECMO, Dialysis, and Myasthenia Gravis patients 2. Is GFR >/= 30 ml/min? Yes.    Patient's GFR today is > 60 3. Is SCr </= 2? Yes.   Patient's SCr is 0.95 mg/dL 4. Did SCr increase >/= 0.5 in 24 hours? No. 5.Pt's weight >40kg  Yes.   6. Abnormal electrolyte(s): K+ 3.2  7. Electrolytes replaced per protocol 8.  Call MD STAT for K+ </= 2.5, Phos </= 1, or Mag </= 1 Physician:  Dr Elby Showers 07/14/2022 2:48 AM

## 2022-07-14 NOTE — Procedures (Signed)
Extubation Procedure Note  Patient Details:   Name: Brent Morales. DOB: [birth year] MRN: [id number]   Airway Documentation:    Vent end date: 07/14/22 Vent end time: 0830   Evaluation  O2 sats: stable throughout Complications: No apparent complications Patient did tolerate procedure well. Bilateral Breath Sounds: Clear, Diminished   Yes  Pt extubated to 4l Bell with RN at bedside. Positive cuff leak and pt is tolerating well. RT will monitor.  Lajuan Lines 07/14/2022, 8:32 AM

## 2022-07-14 NOTE — Progress Notes (Signed)
NAME:  Brent Michelena., MRN:  [id number], DOB:  Sep 06, 1975, LOS: 2 ADMISSION DATE:  07/12/2022, CONSULTATION DATE:  5/3 REFERRING MD:  Dr. Jacqulyn Bath, CHIEF COMPLAINT:  Stroke   History of Present Illness:  Patient is a 47 year old male with pertinent PMH anxiety/depression, cervical stenosis, pancreatitis presents to [facility] ED on 5/3 with AMS.  Over the past 4 days, patient has been having increased somnolence and weakness associated associated w/ cold like symptoms and low grade fever 99.5 F.  Per mother he has been coughing up brown sputum. Patient also having episodes of N/V.  On 5/2 patient went to [facility] ED but patient refused work up and was discharged.   Patient came back to [facility] on 5/3 for worsening AMS. BP 170/101. Afebrile. Patient altered and localizing w/ LUE>RUE. Patient throwing up coffee ground emesis and not protecting airway requiring intubation. Ethanol, acetaminophen, salicylate level wnl. Ammonia 35. UDS pending. 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 Neuro consulted. MRI ordered. CT chest/abd/pelvis w/ no acute abnormality; distal esophageal wall thickening likely esophagitis. Ifob positive, hgb 16.4. Patient given Protonix bolus. PCCM consulted for icu admission.  Pertinent ed labs: K 2.8, glucose 116, wbc 17.2, la 1.5  Pertinent  Medical History   Past Medical History:  Diagnosis Date   Anxiety    Arthritis    Cervical stenosis of spinal canal    Depression    Fibromyalgia    Headache    Hernia    Bilateral Inguinal   Pancreatitis    Syncope    April 2016     Significant Hospital Events: Including procedures, antibiotic start and stop dates in addition to other pertinent events   5/3 patient altered; intubated for airway protection; ct head showing posterior circulation stroke.  Interim History / Subjective:  Patient remained agitated overnight, trying to self extubate Does not open eyes  Remained afebrile  Objective   Blood pressure 133/78, pulse (!) 58,  temperature 98.4 F (36.9 C), resp. rate 16, height 6' (1.829 m), weight 90.7 kg, SpO2 99 %.    Vent Mode: PRVC FiO2 (%):  [35 %-40 %] 35 % Set Rate:  [16 bmp] 16 bmp Vt Set:  [620 mL] 620 mL PEEP:  [5 cmH20] 5 cmH20 Plateau Pressure:  [16 cmH20-19 cmH20] 18 cmH20   Intake/Output Summary (Last 24 hours) at 07/14/2022 0731 Last data filed at 07/14/2022 0600 Gross per 24 hour  Intake 4394.61 ml  Output 795 ml  Net 3599.61 ml   Filed Weights   07/12/22 1902 07/13/22 0400  Weight: 71 kg 90.7 kg    Examination:   Physical exam: General: Crtitically ill-appearing male, orally intubated HEENT: [place]/AT, eyes anicteric.  ETT and OGT in place Neuro: Eyes are forcibly closed, does not open, not following commands, moving all 4 extremities Chest: Rhonchorous breath sounds bilaterally, no wheezes Heart: Regular rate and rhythm, no murmurs or gallops Abdomen: Soft, nondistended, bowel sounds present Skin: No rash   Labs and images were reviewed  Resolved Hospital Problem list     Assessment & Plan:  L occipital stroke was ruled out CT head: Showed suspicion of acute/subacute L occipital lobe stroke, MRI brain ruled out acute stroke Appreciate neurology consult  Acute toxic encephalopathy: In the setting of THC and multiple anxiety medications and oxycodone  Patient is agitated on mechanical ventilator Ethanol, ammonia, salicylate/acetaminophen level negative UDS showed THC and benzos Continue neuro watch Will taper off sedation to see if he wakes up and follows commands  Mirtazapine was stopped as an outpatient He was started on sertraline and quetiapine which was restarted here  Acute respiratory insufficiency in the setting of frequent vomiting Continue lung protective ventilation VAP prevention bundle in place PAD protocol with propofol and fentanyl with RASS goal -0 Patient is tolerating spontaneous breathing trial, will try to extubate him today if he remains calm  Acute  gastroenteritis Patient presented with frequent vomiting, CT abdomen pelvis is suggestive of esophagitis Continue Protonix Continue Reglan Continue IV fluid  Hypokalemia In the setting of frequent vomiting Serum potassium is still remain low at 2.9 Continue aggressive electrolyte supplement and monitor  Probable Mallory-Weiss tear Patient came with frequent vomiting FOBT is positive Continue Protonix twice daily Continue Zofran and Reglan H&H dropped from 15-12 likely dilutional Monitor H&H  Anxiety/depression Fibromyalgia Resume sertraline and Seroquel  Best Practice (right click and "Reselect all SmartList Selections" daily)   Diet/type: NPO DVT prophylaxis: Subcu Lovenox GI prophylaxis: PPI Lines: N/A Foley:  N/A Code Status:  full code Last date of multidisciplinary goals of care discussion [5/3 talked with mother at bedside and updated]  Labs   CBC: Recent Labs  Lab 07/12/22 1925 07/12/22 1927 07/12/22 2235 07/13/22 0220 07/14/22 0033  WBC 17.2*  --   --  14.0* 11.6*  NEUTROABS 13.7*  --   --   --  7.7  HGB 16.4 17.0  16.7 14.3 15.3 12.4*  HCT 49.4 50.0  49.0 42.0 45.1 38.1*  MCV 91.5  --   --  90.6 92.5  PLT 416*  --   --  323 232    Basic Metabolic Panel: Recent Labs  Lab 07/12/22 1925 07/12/22 1927 07/12/22 2203 07/12/22 2235 07/13/22 0220 07/14/22 0033  NA 145 146*  146*  --  145 146* 142  K 2.8* 2.8*  2.8*  --  2.8* 2.9* 3.2*  CL 101 105  --   --  106 111  CO2 26  --   --   --  26 25  GLUCOSE 116* 117*  --   --  88 81  BUN 42* 41*  --   --  40* 28*  CREATININE 1.08 1.00  --   --  1.06 0.95  CALCIUM 9.9  --   --   --  9.2 8.3*  MG  --   --  2.7*  --  3.3* 2.5*  PHOS  --   --   --   --   --  3.0   GFR: Estimated Creatinine Clearance: 107.8 mL/min (by C-G formula based on SCr of 0.95 mg/dL). Recent Labs  Lab 07/12/22 1925 07/13/22 0220 07/13/22 0611 07/14/22 0033  PROCALCITON  --   --  <0.10  --   WBC 17.2* 14.0*  --  11.6*   LATICACIDVEN 1.5  --   --   --     Liver Function Tests: Recent Labs  Lab 07/12/22 1925 07/13/22 0220 07/14/22 0033  AST 35 33 23  ALT 24 24 20   ALKPHOS 116 101 81  BILITOT 0.8 0.9 1.0  PROT 8.7* 7.6 6.0*  ALBUMIN 4.6 4.0 3.1*   Recent Labs  Lab 07/12/22 1925  LIPASE 38   Recent Labs  Lab 07/12/22 2057 07/13/22 0220  AMMONIA 35 44*    ABG    Component Value Date/Time   PHART 7.488 (H) 07/12/2022 2235   PCO2ART 37.7 07/12/2022 2235   PO2ART 236 (H) 07/12/2022 2235   HCO3 28.5 (H) 07/12/2022 2235   TCO2  30 07/12/2022 2235   O2SAT 100 07/12/2022 2235     Coagulation Profile: No results for input(s): "INR", "PROTIME" in the last 168 hours.  Cardiac Enzymes: No results for input(s): "CKTOTAL", "CKMB", "CKMBINDEX", "TROPONINI" in the last 168 hours.  HbA1C: No results found for: "HGBA1C"  CBG: Recent Labs  Lab 07/13/22 1225 07/13/22 1552 07/13/22 1949 07/13/22 2315 07/14/22 0257  GLUCAP 84 80 94 81 80    This patient is critically ill with multiple organ system failure which requires frequent high complexity decision making, assessment, support, evaluation, and titration of therapies. This was completed through the application of advanced monitoring technologies and extensive interpretation of multiple databases.  During this encounter critical care time was devoted to patient care services described in this note for 35 minutes.    Cheri Fowler, MD [facility] See Amion for pager If no response to pager, please call [phone] until 7pm After 7pm, Please call E-link [phone]

## 2022-07-15 DIAGNOSIS — E876 Hypokalemia: Secondary | ICD-10-CM

## 2022-07-15 DIAGNOSIS — G928 Other toxic encephalopathy: Secondary | ICD-10-CM

## 2022-07-15 LAB — CBC
HCT: 36.4 % — ABNORMAL LOW (ref 39.0–52.0)
Hemoglobin: 12.6 g/dL — ABNORMAL LOW (ref 13.0–17.0)
MCH: 30.4 pg (ref 26.0–34.0)
MCHC: 34.6 g/dL (ref 30.0–36.0)
MCV: 87.7 fL (ref 80.0–100.0)
Platelets: 207 10*3/uL (ref 150–400)
RBC: 4.15 MIL/uL — ABNORMAL LOW (ref 4.22–5.81)
RDW: 13.4 % (ref 11.5–15.5)
WBC: 10 10*3/uL (ref 4.0–10.5)
nRBC: 0 % (ref 0.0–0.2)

## 2022-07-15 LAB — GLUCOSE, CAPILLARY
Glucose-Capillary: 108 mg/dL — ABNORMAL HIGH (ref 70–99)
Glucose-Capillary: 79 mg/dL (ref 70–99)
Glucose-Capillary: 83 mg/dL (ref 70–99)
Glucose-Capillary: 83 mg/dL (ref 70–99)
Glucose-Capillary: 91 mg/dL (ref 70–99)
Glucose-Capillary: 92 mg/dL (ref 70–99)
Glucose-Capillary: 97 mg/dL (ref 70–99)

## 2022-07-15 LAB — BASIC METABOLIC PANEL
Anion gap: 12 (ref 5–15)
BUN: 14 mg/dL (ref 6–20)
CO2: 21 mmol/L — ABNORMAL LOW (ref 22–32)
Calcium: 8.4 mg/dL — ABNORMAL LOW (ref 8.9–10.3)
Chloride: 109 mmol/L (ref 98–111)
Creatinine, Ser: 0.88 mg/dL (ref 0.61–1.24)
GFR, Estimated: >60 mL/min (ref >60)
Glucose, Bld: 91 mg/dL (ref 70–99)
Potassium: 3.3 mmol/L — ABNORMAL LOW (ref 3.5–5.1)
Sodium: 142 mmol/L (ref 135–145)

## 2022-07-15 LAB — CULTURE, BLOOD (ROUTINE X 2): Special Requests: ADEQUATE

## 2022-07-15 LAB — T4, FREE: Free T4: 1.14 ng/dL — ABNORMAL HIGH (ref 0.61–1.12)

## 2022-07-15 MED ORDER — ORAL CARE MOUTH RINSE
15.0000 mL | Freq: Two times a day (BID) | OROMUCOSAL | Status: DC
  Administered 2022-07-15 – 2022-07-17 (×4): 15 mL via OROMUCOSAL

## 2022-07-15 MED ORDER — HYDRALAZINE HCL 20 MG/ML IJ SOLN
10.0000 mg | Freq: Four times a day (QID) | INTRAMUSCULAR | Status: DC | PRN
  Administered 2022-07-16: 10 mg via INTRAVENOUS
  Filled 2022-07-15: qty 1

## 2022-07-15 MED ORDER — POTASSIUM CHLORIDE CRYS ER 20 MEQ PO TBCR
20.0000 meq | EXTENDED_RELEASE_TABLET | ORAL | Status: AC
  Administered 2022-07-15 (×2): 20 meq via ORAL
  Filled 2022-07-15 (×2): qty 1

## 2022-07-15 MED ORDER — ONDANSETRON HCL 4 MG/2ML IJ SOLN
4.0000 mg | Freq: Once | INTRAMUSCULAR | Status: AC
  Administered 2022-07-15: 4 mg via INTRAVENOUS
  Filled 2022-07-15: qty 2

## 2022-07-15 MED ORDER — POTASSIUM CHLORIDE 10 MEQ/100ML IV SOLN
10.0000 meq | INTRAVENOUS | Status: AC
  Administered 2022-07-15 (×4): 10 meq via INTRAVENOUS
  Filled 2022-07-15 (×4): qty 100

## 2022-07-15 MED ORDER — PANTOPRAZOLE SODIUM 40 MG PO TBEC
40.0000 mg | DELAYED_RELEASE_TABLET | Freq: Two times a day (BID) | ORAL | Status: DC
  Administered 2022-07-15 – 2022-07-17 (×3): 40 mg via ORAL
  Filled 2022-07-15 (×4): qty 1

## 2022-07-15 NOTE — Progress Notes (Addendum)
Pt refused to eat his lunch tray despite several attempts... Offered pt Ensure but he refused.... education given.   Mother at bedside was encourage to try to get his son to eat.... sts pt does not like meatloaf, mac and cheese, and broccoli... Sts she bought him pizza and a [place] sandwich.... mom will try to encourage pt to eat as well.

## 2022-07-15 NOTE — Progress Notes (Signed)
[facility] ADULT ICU REPLACEMENT PROTOCOL   The patient does apply for the [facility] Adult ICU Electrolyte Replacment Protocol based on the criteria listed below:   1.Exclusion criteria: TCTS, ECMO, Dialysis, and Myasthenia Gravis patients 2. Is GFR >/= 30 ml/min? Yes.    Patient's GFR today is > 60 3. Is SCr </= 2? Yes.   Patient's SCr is 0.88 mg/dL 4. Did SCr increase >/= 0.5 in 24 hours? No. 5.Pt's weight >40kg  Yes.   6. Abnormal electrolyte(s): K+ 3.3  7. Electrolytes replaced per protocol 8.  Call MD STAT for K+ </= 2.5, Phos </= 1, or Mag </= 1 Physician:  Dr Ellwood Dense, [place] 07/15/2022 6:18 AM

## 2022-07-15 NOTE — Progress Notes (Addendum)
PROGRESS NOTE   Brent Morales.  ZOX:[id number]    DOB: 07/07/1975    DOA: 07/12/2022  PCP: Floreen Comber, MD   I have briefly reviewed patients previous medical records in [facility].  Chief Complaint  Patient presents with   Altered Mental Status    Brief Narrative:  47 year old male, reportedly lives with his mother, independent, mows lawn, medical history significant for anxiety, depression,?  Chronic pain/fibromyalgia, tobacco use, presented to the ED on 5/3 with altered mental status.  In ED, he was noted to be altered, localizing with LUE >RUE, throwing up coffee-ground emesis, not protecting airway, required intubation.  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  Admitted to ICU by CCM.  Neurology consulted.  Extubated 5/5.  Stabilized and care transferred to [facility] and progressive floor on 5/6.   Assessment & Plan:  Principal Problem:   Stroke [facility]) Active Problems:   Acute respiratory failure (HCC)   Encephalopathy acute   Acute toxic metabolic encephalopathy: In the setting of THC and multiple anxiety medications and oxycodone.  He was intubated on arrival for airway protection due to AMS and vomiting.  Extubated 5/5.  Ethanol, ammonia, salicylate, acetaminophen levels negative.  UDS positive for THC and benzodiazepines.  Remeron was stopped as outpatient.  Patient has now been resumed on sertraline and Seroquel.  Mental status has improved but will need to discuss with family regarding baseline and if he is back to his baseline.  Neurology had altered for concern of stroke versus seizures.  Stroke ruled out.  EEG suggestive of mild diffuse encephalopathy, nonspecific etiology without seizures or epileptiform discharges.  Aggressive IV thiamine replacement due to concern for Wernicke's given preceding vomiting.  They suspected AMS likely due to toxic metabolic process.  Acute respiratory failure: Secondary to AMS and vomiting, intubated for airway protection and  extubated 5/6.  Currently saturating in the high 90s on 1 L/min [place] oxygen.  Wean as tolerated.  Incentive spirometry.  Aggressive pulmonary toilet and mobilize.  Community-acquired pneumonia: Complete course of antibiotics.  Currently on IV ceftriaxone and oral azithromycin.  Afebrile.  Leukocytosis almost resolved.  Acute viral gastroenteritis: Presented with frequent vomiting.  CT abdomen and pelvis suggestive of esophagitis.  Continue Protonix.  Discontinue and monitor off of Reglan given side effects.  Diet as tolerated.  Hypokalemia:  Replace and follow.  Magnesium 2.5.  Probable Mallory-Weiss tear: Patient came in with frequent vomiting.  FOBT positive.  Continue Protonix but will change to oral twice daily.  Continue as needed Zofran.  Discontinued Reglan.  Hemoglobin had dropped from 15-12 likely dilutional but stable since in the mid 12 g range.  Abnormal CT head: CT head had shown suspicion of acute/subacute left occipital lobe stroke.  However MRI brain ruled out acute stroke.  Neurology consulted and signed off.  Anxiety/depression: Has been resumed on sertraline and Seroquel.  Fibromyalgia/?  Opioid dependence Currently off of oxycodone.  Body mass index is 27.12 kg/m.   ACP Documents: None present DVT prophylaxis: enoxaparin (LOVENOX) injection 40 mg Start: 07/13/22 1000 SCDs Start: 07/13/22 0739 SCDs Start: 07/12/22 2239     Code Status: Full Code:  Family Communication: Left VM message for Mom. Disposition:  Status is: Inpatient Remains inpatient appropriate because: IV antibiotics     Consultants:   PCCM Neurology  Procedures:   As above  Antimicrobials:   IV ceftriaxone and oral azithromycin   Subjective:  Seen this morning along with his nurse in room.  Patient is awake, alert and oriented but per nursing, stooled on himself this morning.  Patient cannot say why.  Speaking in low tones.  Reports smoking a pack of cigarettes per day but denies  alcohol or substance abuse but UDS positive for THC.  States that he does "moving".  Currently denies complaints.  Objective:   Vitals:   07/15/22 0400 07/15/22 0500 07/15/22 0600 07/15/22 0800  BP: (!) 146/85 (!) 150/85 134/79 (!) 167/84  Pulse: 62  73 69  Resp: 14 16 14 16   Temp: 99.2 F (37.3 C)     TempSrc: Axillary     SpO2: 96%  99% 99%  Weight:      Height:        General exam: Young male, moderately built and nourished lying comfortably propped up in bed without distress. Respiratory system: Clear to auscultation. Respiratory effort normal. Cardiovascular system: S1 & S2 heard, RRR. No JVD, murmurs, rubs, gallops or clicks. No pedal edema.  Telemetry personally reviewed: Sinus rhythm Gastrointestinal system: Abdomen is nondistended, soft and nontender. No organomegaly or masses felt. Normal bowel sounds heard. Central nervous system: Alert and oriented. No focal neurological deficits. Extremities: Symmetric 5 x 5 power. Skin: No rashes, lesions or ulcers Psychiatry: Judgement and insight appear at least somewhat impaired. Mood & affect flat, low tone voice.    Data Reviewed:   I have personally reviewed following labs and imaging studies   CBC: Recent Labs  Lab 07/12/22 1925 07/12/22 1927 07/13/22 0220 07/14/22 0033 07/15/22 0540  WBC 17.2*  --  14.0* 11.6* 10.0  NEUTROABS 13.7*  --   --  7.7  --   HGB 16.4   < > 15.3 12.4* 12.6*  HCT 49.4   < > 45.1 38.1* 36.4*  MCV 91.5  --  90.6 92.5 87.7  PLT 416*  --  323 232 207   < > = values in this interval not displayed.    Basic Metabolic Panel: Recent Labs  Lab 07/12/22 1925 07/12/22 1927 07/12/22 2203 07/12/22 2235 07/13/22 0220 07/14/22 0033 07/15/22 0540  NA 145 146*  146*  --  145 146* 142 142  K 2.8* 2.8*  2.8*  --  2.8* 2.9* 3.2* 3.3*  CL 101 105  --   --  106 111 109  CO2 26  --   --   --  26 25 21*  GLUCOSE 116* 117*  --   --  88 81 91  BUN 42* 41*  --   --  40* 28* 14  CREATININE 1.08  1.00  --   --  1.06 0.95 0.88  CALCIUM 9.9  --   --   --  9.2 8.3* 8.4*  MG  --   --  2.7*  --  3.3* 2.5*  --   PHOS  --   --   --   --   --  3.0  --     Liver Function Tests: Recent Labs  Lab 07/12/22 1925 07/13/22 0220 07/14/22 0033  AST 35 33 23  ALT 24 24 20   ALKPHOS 116 101 81  BILITOT 0.8 0.9 1.0  PROT 8.7* 7.6 6.0*  ALBUMIN 4.6 4.0 3.1*    CBG: Recent Labs  Lab 07/15/22 0003 07/15/22 0409 07/15/22 0805  GLUCAP 83 83 97    Microbiology Studies:   Recent Results (from the past 240 hour(s))  Culture, blood (routine x 2)     Status: None (Preliminary result)   Collection  Time: 07/12/22  6:50 PM   Specimen: BLOOD  Result Value Ref Range Status   Specimen Description BLOOD SITE NOT SPECIFIED  Final   Special Requests   Final    BOTTLES DRAWN AEROBIC AND ANAEROBIC Blood Culture results may not be optimal due to an inadequate volume of blood received in culture bottles   Culture   Final    NO GROWTH 2 DAYS Performed at [facility], [address] [place], [place] [address]    Report Status PENDING  Incomplete  Culture, blood (routine x 2)     Status: None (Preliminary result)   Collection Time: 07/12/22  7:25 PM   Specimen: BLOOD  Result Value Ref Range Status   Specimen Description BLOOD RIGHT ANTECUBITAL  Final   Special Requests   Final    BOTTLES DRAWN AEROBIC AND ANAEROBIC Blood Culture adequate volume   Culture   Final    NO GROWTH 2 DAYS Performed at [facility], [address] [place], [place] [address]    Report Status PENDING  Incomplete  MRSA Next Gen by PCR, Nasal     Status: None   Collection Time: 07/13/22 12:24 AM   Specimen: Nasal Mucosa; Nasal Swab  Result Value Ref Range Status   MRSA by PCR Next Gen NOT DETECTED NOT DETECTED Final    Comment: (NOTE) The GeneXpert MRSA Assay (FDA approved for NASAL specimens only), is one component of a comprehensive MRSA colonization surveillance program. It is not intended to diagnose  MRSA infection nor to guide or monitor treatment for MRSA infections. Test performance is not FDA approved in patients less than 91 years old. Performed at [facility], [address] [place], [place] [address]   SARS Coronavirus 2 by RT PCR (hospital order, performed in [facility] hospital lab) *cepheid single result test*     Status: None   Collection Time: 07/13/22  1:12 AM  Result Value Ref Range Status   SARS Coronavirus 2 by RT PCR NEGATIVE NEGATIVE Final    Comment: Performed at [facility], [address], [place], [place] [address]    Radiology Studies:  EEG adult  Result Date: 07/13/2022 Charlsie Quest, MD     07/13/2022  1:28 PM Patient Name: Brent Libman Danzy Montez Morales. MRN: [id number] Epilepsy Attending: Charlsie Quest Referring Physician/Provider: Gordy Councilman, MD Date: 07/13/2022 Duration: 22.16 mins Patient history: 47yo M with ams getting eeg to evaluate for seizure Level of alertness: Awake AEDs during EEG study: propofol Technical aspects: This EEG study was done with scalp electrodes positioned according to the 10-20 International system of electrode placement. Electrical activity was reviewed with band pass filter of 1-70Hz , sensitivity of 7 uV/mm, display speed of 20mm/sec with a 60Hz  notched filter applied as appropriate. EEG data were recorded continuously and digitally stored.  Video monitoring was available and reviewed as appropriate. Description: The posterior dominant rhythm consists of 7.5 Hz activity of moderate voltage (25-35 uV) seen predominantly in posterior head regions, symmetric and reactive to eye opening and eye closing. EEG showed intermittent generalized predominantly 5 to 7 Hz theta slowing admixed with 2-3hz  delta slowing. Hyperventilation and photic stimulation were not performed.   ABNORMALITY - Intermittent slow, generalized IMPRESSION: This study is suggestive of mild diffuse encephalopathy, nonspecific etiology. No seizures or epileptiform  discharges were seen throughout the recording. Priyanka Annabelle Harman    Scheduled Meds:    azithromycin  500 mg Oral Daily  Chlorhexidine Gluconate Cloth  6 each Topical Daily   Chlorhexidine Gluconate Cloth  6 each Topical QHS   enoxaparin (LOVENOX) injection  40 mg Subcutaneous Daily   metoCLOPramide (REGLAN) injection  10 mg Intravenous Q6H   mouth rinse  15 mL Mouth Rinse BID   pantoprazole (PROTONIX) IV  40 mg Intravenous Q12H   potassium chloride  20 mEq Oral Q4H   QUEtiapine  150 mg Oral QHS   sertraline  100 mg Oral Daily    Continuous Infusions:    cefTRIAXone (ROCEPHIN)  IV Stopped (07/15/22 0600)   dexmedetomidine (PRECEDEX) IV infusion Stopped (07/14/22 0809)   lactated ringers Stopped (07/15/22 0744)   potassium chloride 100 mL/hr at 07/15/22 0800   thiamine (VITAMIN B1) injection Stopped (07/15/22 8119)     LOS: 3 days     Marcellus Scott, MD,  FACP, FHM, [facility], [facility], [facility]   [facility] [place]     To contact the attending provider between 7A-7P or the covering provider during after hours 7P-7A, please log into the web site [email or web address] and access using universal [place] password for that web site. If you do not have the password, please call the hospital operator.  07/15/2022, 8:28 AM

## 2022-07-15 NOTE — Progress Notes (Signed)
Pt is A&O x2, self and time... he refused to eat breakfast this morning... education given.

## 2022-07-15 NOTE — Evaluation (Signed)
Physical Therapy Evaluation Patient Details Name: Brent Morales. MRN: [id number] DOB: [birth year] Today's Date: 07/15/2022  History of Present Illness  47 year old male presents to ED on 5/03 with AMS; began vomiting coffee ground emesis and intubated 5/3; CT head w/ L occipital lobe stroke, however MRI no acute process;  acute gastroenteritis; extubated 5/5;  PMH anxiety/depression, cervical stenosis, pancreatitis  Clinical Impression   Pt admitted secondary to problem above with deficits below. PTA patient was living with his mother in a one level home with 6 steps to enter. He was independent with all mobility and managed his own finances. He is disabled due to neck and back injuries, per mother.  Pt currently requires mod assist to get to sitting on EOB, and then stands/walks with min assist x 120 ft. Pt nearly non-verbal throughout session and only oriented to self. Mother concerned and reports he was talking much more normal yesterday. RN aware and has contacted MD. Anticipate patient will benefit from PT to address problems listed below. Will continue to follow acutely to maximize functional mobility independence and safety.       Recommendations for follow up therapy are one component of a multi-disciplinary discharge planning process, led by the attending physician.  Recommendations may be updated based on patient status, additional functional criteria and insurance authorization.  Follow Up Recommendations       Assistance Recommended at Discharge Frequent or constant Supervision/Assistance (unless cognition clears)  Patient can return home with the following  A little help with bathing/dressing/bathroom;Assistance with cooking/housework;Direct supervision/assist for medications management;Direct supervision/assist for financial management;Assist for transportation;Help with stairs or ramp for entrance    Equipment Recommendations None recommended by PT  Recommendations for Other  Services  Speech consult    Functional Status Assessment Patient has had a recent decline in their functional status and demonstrates the ability to make significant improvements in function in a reasonable and predictable amount of time.     Precautions / Restrictions Precautions Precautions: Fall;Other (comment) Precaution Comments: nearly non-verbal      Mobility  Bed Mobility Overal bed mobility: Needs Assistance Bed Mobility: Rolling, Sidelying to Sit, Sit to Sidelying Rolling: Supervision Sidelying to sit: Min guard, HOB elevated     Sit to sidelying: Min guard General bed mobility comments: pt required step by step cues to sequence getting in and out of bed    Transfers Overall transfer level: Needs assistance Equipment used: None Transfers: Sit to/from Stand Sit to Stand: Min guard           General transfer comment: stood x 3 reps from EOB    Ambulation/Gait Ambulation/Gait assistance: Min guard Gait Distance (Feet): 120 Feet Assistive device: 1 person hand held assist Gait Pattern/deviations: Step-through pattern, Decreased stride length, Shuffle   Gait velocity interpretation: <1.8 ft/sec, indicate of risk for recurrent falls Pre-gait activities: marching, side-stepping at EOB General Gait Details: VERY slow cadence with minimal ability to incr velocity or to maintain incr velocity; shuffling steps with poor clearance  Stairs            Wheelchair Mobility    Modified Rankin (Stroke Patients Only)       Balance Overall balance assessment: No apparent balance deficits (not formally assessed)                                           Pertinent Vitals/Pain Pain  Assessment Pain Assessment: No/denies pain    Home Living Family/patient expects to be discharged to:: Private residence Living Arrangements: Parent (mother) Available Help at Discharge: Family;Available PRN/intermittently (mother works part-time) Type of Home:  Mobile home Home Access: Stairs to enter Entrance Stairs-Rails: Right;Left;Can reach both Secretary/administrator of Steps: 6   Home Layout: One level Home Equipment: None      Prior Function Prior Level of Function : Independent/Modified Independent (on disability for neck and back injuries)               ADLs Comments: normally handles his own finances     Hand Dominance        Extremity/Trunk Assessment   Upper Extremity Assessment Upper Extremity Assessment: Defer to OT evaluation    Lower Extremity Assessment Lower Extremity Assessment: Overall WFL for tasks assessed    Cervical / Trunk Assessment Cervical / Trunk Assessment: Normal  Communication   Communication: Expressive difficulties (mostly stares when asked a question; some verbalizations "[employer]")  Cognition Arousal/Alertness: Awake/alert Behavior During Therapy: Flat affect Overall Cognitive Status: Impaired/Different from baseline Area of Impairment: Orientation, Attention, Memory, Following commands, Awareness, Problem solving                 Orientation Level: Place, Time, Situation Current Attention Level: Sustained Memory: Decreased short-term memory Following Commands: Follows one step commands inconsistently   Awareness:  (difficult to assess due to mostly non-verbal) Problem Solving: Slow processing, Decreased initiation, Difficulty sequencing, Requires verbal cues, Requires tactile cues General Comments: difficulty sequencing supine with HOB elevated to sitting on EOB; max verbal cues and mod physical assist        General Comments General comments (skin integrity, edema, etc.): Mother and Aunt present. State pt was talking after extubated yesterday and today barely talking. RN aware    Exercises     Assessment/Plan    PT Assessment Patient needs continued PT services  PT Problem List Decreased mobility;Decreased cognition       PT Treatment Interventions Gait  training;Stair training;Functional mobility training;Therapeutic activities;Cognitive remediation;Patient/family education    PT Goals (Current goals can be found in the Care Plan section)  Acute Rehab PT Goals Patient Stated Goal: pt unable to state; mother wants him to "return to his usual self" PT Goal Formulation: With family Time For Goal Achievement: 07/29/22 Potential to Achieve Goals: Good    Frequency Min 3X/week     Co-evaluation               AM-PAC PT "6 Clicks" Mobility  Outcome Measure Help needed turning from your back to your side while in a flat bed without using bedrails?: A Little Help needed moving from lying on your back to sitting on the side of a flat bed without using bedrails?: A Lot Help needed moving to and from a bed to a chair (including a wheelchair)?: A Little Help needed standing up from a chair using your arms (e.g., wheelchair or bedside chair)?: A Little Help needed to walk in hospital room?: A Little Help needed climbing 3-5 steps with a railing? : A Little 6 Click Score: 17    End of Session Equipment Utilized During Treatment: Gait belt Activity Tolerance: Patient tolerated treatment well Patient left: in bed;with call bell/phone within reach;with bed alarm set;with family/visitor present Nurse Communication: Mobility status PT Visit Diagnosis: Difficulty in walking, not elsewhere classified (R26.2)    Time: 5284-1324 PT Time Calculation (min) (ACUTE ONLY): 31 min   Charges:  PT Evaluation $PT Eval Low Complexity: 1 Low PT Treatments $Gait Training: 8-22 mins         Jerolyn Center, PT Acute Rehabilitation Services  Office [phone]   Zena Amos 07/15/2022, 3:19 PM

## 2022-07-15 NOTE — Progress Notes (Signed)
  Transition of Care [facility]) Screening Note   Patient Details  Name: Brent Morales. Date of Birth: 22-Jan-1976   Transition of Care [facility]) CM/SW Contact:    Mearl Latin, LCSW Phone Number: 07/15/2022, 9:09 AM    Transition of Care Department [facility]) has reviewed patient from home with his mother. We will continue to monitor patient advancement through interdisciplinary progression rounds. If new patient transition needs arise, please place a TOC consult.

## 2022-07-16 DIAGNOSIS — E876 Hypokalemia: Secondary | ICD-10-CM | POA: Diagnosis not present

## 2022-07-16 DIAGNOSIS — G928 Other toxic encephalopathy: Secondary | ICD-10-CM | POA: Diagnosis not present

## 2022-07-16 LAB — GLUCOSE, CAPILLARY
Glucose-Capillary: 87 mg/dL (ref 70–99)
Glucose-Capillary: 84 mg/dL (ref 70–99)

## 2022-07-16 LAB — BASIC METABOLIC PANEL
Anion gap: 12 (ref 5–15)
BUN: 12 mg/dL (ref 6–20)
CO2: 20 mmol/L — ABNORMAL LOW (ref 22–32)
Calcium: 8.5 mg/dL — ABNORMAL LOW (ref 8.9–10.3)
Chloride: 106 mmol/L (ref 98–111)
Creatinine, Ser: 0.82 mg/dL (ref 0.61–1.24)
GFR, Estimated: >60 mL/min (ref >60)
Glucose, Bld: 84 mg/dL (ref 70–99)
Potassium: 3.2 mmol/L — ABNORMAL LOW (ref 3.5–5.1)
Sodium: 138 mmol/L (ref 135–145)

## 2022-07-16 LAB — CULTURE, BLOOD (ROUTINE X 2): Culture: NO GROWTH

## 2022-07-16 LAB — T3, FREE: T3, Free: 1.8 pg/mL — ABNORMAL LOW (ref 2.0–4.4)

## 2022-07-16 LAB — MAGNESIUM: Magnesium: 1.9 mg/dL (ref 1.7–2.4)

## 2022-07-16 LAB — VITAMIN B1: Vitamin B1 (Thiamine): 139.1 nmol/L (ref 66.5–200.0)

## 2022-07-16 MED ORDER — POTASSIUM CHLORIDE CRYS ER 20 MEQ PO TBCR
40.0000 meq | EXTENDED_RELEASE_TABLET | ORAL | Status: AC
  Administered 2022-07-16 (×2): 40 meq via ORAL
  Filled 2022-07-16 (×2): qty 2

## 2022-07-16 MED ORDER — NICOTINE 21 MG/24HR TD PT24
21.0000 mg | MEDICATED_PATCH | Freq: Every day | TRANSDERMAL | Status: DC
  Administered 2022-07-17: 21 mg via TRANSDERMAL
  Filled 2022-07-16 (×2): qty 1

## 2022-07-16 MED ORDER — AZITHROMYCIN 500 MG PO TABS: 500.0000 mg | ORAL_TABLET | Freq: Every day | ORAL | Status: DC

## 2022-07-16 NOTE — Plan of Care (Signed)
  Problem: Education: Goal: Knowledge of General Education information will improve Description Including pain rating scale, medication(s)/side effects and non-pharmacologic comfort measures Outcome: Progressing   

## 2022-07-16 NOTE — Evaluation (Signed)
Occupational Therapy Evaluation Patient Details Name: Brent Morales. MRN: [id number] DOB: [birth year] Today's Date: 07/16/2022   History of Present Illness 47 year old male presents to ED on 5/03 with AMS; began vomiting coffee ground emesis and intubated 5/3; CT head w/ L occipital lobe stroke, however MRI no acute process;  acute gastroenteritis; extubated 5/5. DX: Acute toxic metabolic encephalopathy due to [facility] and multiple anxiety medications and oxycodone Acute respiratory failure, and CAP.   PMH anxiety/depression, cervical stenosis, pancreatitis.   Clinical Impression   This 47 yo male admitted with above presents to acute OT with PLOF of being totally independent with basic ADLs, IADLs, and driving. Currently he is at a min A level for mobility and ADLs and cognition is not at baseline, but needs to be independent to go home due to his mother (that he lives with) works part-time. We will continue to follow.      Recommendations for follow up therapy are one component of a multi-disciplinary discharge planning process, led by the attending physician.  Recommendations may be updated based on patient status, additional functional criteria and insurance authorization.   Assistance Recommended at Discharge Frequent or constant Supervision/Assistance  Patient can return home with the following A little help with walking and/or transfers;A little help with bathing/dressing/bathroom;Assistance with cooking/housework;Assist for transportation;Help with stairs or ramp for entrance;Direct supervision/assist for financial management;Direct supervision/assist for medications management    Functional Status Assessment  Patient has had a recent decline in their functional status and demonstrates the ability to make significant improvements in function in a reasonable and predictable amount of time.  Equipment Recommendations  None recommended by OT       Precautions / Restrictions  Precautions Precautions: Fall Restrictions Weight Bearing Restrictions: No      Mobility Bed Mobility Overal bed mobility: Needs Assistance Bed Mobility: Supine to Sit, Sit to Supine     Supine to sit: Min guard Sit to supine: Min guard        Transfers Overall transfer level: Needs assistance Equipment used: 1 person hand held assist Transfers: Sit to/from Stand Sit to Stand: Min assist                  Balance Overall balance assessment: Needs assistance Sitting-balance support: No upper extremity supported, Feet supported Sitting balance-Leahy Scale: Good     Standing balance support: Single extremity supported Standing balance-Leahy Scale: Poor                             ADL either performed or assessed with clinical judgement   ADL Overall ADL's : Needs assistance/impaired         Upper Body Bathing: Minimal assistance;Sitting Upper Body Bathing Details (indicate cue type and reason): Needed VCs to switch washcloth from RUE to LUE to wash under right arm pit (was trying to use RUE to wash under right arm pit) Lower Body Bathing: Minimal assistance;Sit to/from stand   Upper Body Dressing : Minimal assistance;Sitting   Lower Body Dressing: Minimal assistance;Sit to/from stand Lower Body Dressing Details (indicate cue type and reason): Can cross legs over one another in "figure 4" position to get to socks Toilet Transfer: Minimal assistance;Ambulation Toilet Transfer Details (indicate cue type and reason): One person HHA from bed>other bed in hallway to transfer to 3w Toileting- Clothing Manipulation and Hygiene: Moderate assistance Toileting - Clothing Manipulation Details (indicate cue type and reason): min A sit<>stand  Vision Patient Visual Report: No change from baseline              Pertinent Vitals/Pain Pain Assessment Pain Assessment: No/denies pain     Hand Dominance Right   Extremity/Trunk Assessment  Upper Extremity Assessment Upper Extremity Assessment: Overall WFL for tasks assessed           Communication Communication Communication:  (answering questions readily today)   Cognition Arousal/Alertness: Awake/alert Behavior During Therapy: Flat affect (interpersed with smiles) Overall Cognitive Status: Impaired/Different from baseline Area of Impairment: Orientation, Attention, Following commands, Awareness, Safety/judgement, Problem solving                 Orientation Level:  (oriented to all except why he is here (response was sick, but did not know specifcs)) Current Attention Level: Sustained   Following Commands: Follows one step commands inconsistently (needed increased cues ~25% of time) Safety/Judgement: Decreased awareness of deficits Awareness: Intellectual Problem Solving: Slow processing, Decreased initiation, Difficulty sequencing, Requires verbal cues, Requires tactile cues General Comments: No physical A today to get to EOB, but it took 3 different verbal/gestural cues before he actually scooted foreward to EOB                Home Living Family/patient expects to be discharged to:: Private residence Living Arrangements: Parent (mother) Available Help at Discharge: Family;Available PRN/intermittently (mother works part time) Type of Home: Mobile home Home Access: Stairs to enter Secretary/administrator of Steps: 6 Entrance Stairs-Rails: Right;Left;Can reach both Home Layout: One level     Bathroom Shower/Tub: Chief Strategy Officer: Standard     Home Equipment: None   Additional Comments: worked as a Psychologist, clinical Prior Level of Function : Independent/Modified Independent (on disability for neck and back injuries)               ADLs Comments: normally handles his own finances        OT Problem List: Impaired balance (sitting and/or standing);Decreased cognition      OT  Treatment/Interventions: DME and/or AE instruction;Patient/family education;Balance training    OT Goals(Current goals can be found in the care plan section) Acute Rehab OT Goals Patient Stated Goal: to be able to walk on my own out of here OT Goal Formulation: With patient Time For Goal Achievement: 07/30/22 Potential to Achieve Goals: Good  OT Frequency: Min 1X/week       AM-PAC OT "6 Clicks" Daily Activity     Outcome Measure Help from another person eating meals?: A Little Help from another person taking care of personal grooming?: A Little Help from another person toileting, which includes using toliet, bedpan, or urinal?: A Little Help from another person bathing (including washing, rinsing, drying)?: A Little Help from another person to put on and taking off regular upper body clothing?: A Little Help from another person to put on and taking off regular lower body clothing?: A Little 6 Click Score: 18   End of Session Equipment Utilized During Treatment: Gait belt  Activity Tolerance: Patient tolerated treatment well Patient left: in bed (with nursing getting ready to transfer him to 3w)  OT Visit Diagnosis: Unsteadiness on feet (R26.81);Other abnormalities of gait and mobility (R26.89);Other symptoms and signs involving cognitive function                Time: 1610-9604 OT Time Calculation (min): 39 min Charges:  OT General Charges $OT Visit: 1 Visit OT Evaluation $  OT Eval Moderate Complexity: 1 Mod OT Treatments $Self Care/Home Management : 23-37 mins  Lindon Romp OT Acute Rehabilitation Services Office [phone]    Evette Georges 07/16/2022, 5:06 PM

## 2022-07-16 NOTE — Progress Notes (Signed)
PROGRESS NOTE   Brent Morales.  BJY:[id number]    DOB: 04-05-1975    DOA: 07/12/2022  PCP: Floreen Comber, MD   I have briefly reviewed patients previous medical records in [facility].  Chief Complaint  Patient presents with   Altered Mental Status    Brief Narrative:  47 year old male, reportedly lives with his mother, independent, mows lawn, medical history significant for anxiety, depression,?  Chronic pain/fibromyalgia, tobacco use, presented to the ED on 5/3 with altered mental status.  In ED, he was noted to be altered, localizing with LUE >RUE, throwing up coffee-ground emesis, not protecting airway, required intubation.  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  Admitted to ICU by CCM.  Neurology consulted.  Extubated 5/5.  Stabilized and care transferred to [facility] and progressive floor on 5/6.  Although mental status is improved and he answers coherency questions appropriately, still appears somewhat confused.   Assessment & Plan:  Principal Problem:   Stroke [facility]) Active Problems:   Acute respiratory failure (HCC)   Encephalopathy acute   Acute toxic metabolic encephalopathy: In the setting of THC and multiple anxiety medications and oxycodone.  He was intubated on arrival for airway protection due to AMS and vomiting.  Extubated 5/5.  Ethanol, ammonia, salicylate, acetaminophen levels negative.  UDS positive for THC and benzodiazepines.  Remeron was stopped as outpatient.  Patient has now been resumed on sertraline and Seroquel.  Mental status has improved but not back to baseline, even as per patient's mother's report to RN when she visited him yesterday.  Neurology had consulted for concern of stroke versus seizures.  Stroke ruled out.  EEG suggestive of mild diffuse encephalopathy, nonspecific etiology without seizures or epileptiform discharges.  Has completed course of aggressive IV iron replacement due to concern for Wernicke's given preceding vomiting.   Neurology suspected AMS likely due to toxic metabolic process.  Although patient answers orientation questions appropriately, suspect that his mental status is still not at baseline, had stooled on himself twice yesterday, appears somewhat confused even today.  Awaiting family to get update on his mental status today.  PT has seen and recommend no PT follow-up.  Acute respiratory failure: Secondary to AMS and vomiting, intubated for airway protection and extubated 5/6.  Has been weaned off to room air.  Resolved.  Community-acquired pneumonia: Complete 5 days course of antibiotics.  Currently on IV ceftriaxone and oral azithromycin.  Afebrile.  Leukocytosis resolved.  Acute viral gastroenteritis: Presented with frequent vomiting.  CT abdomen and pelvis suggestive of esophagitis.  Continue Protonix.  Discontinued and monitor off of Reglan given side effects.  Diet as tolerated.  Appears to have resolved as well.  Hypokalemia:  Continue to replace aggressively and follow.  Magnesium 1.9.  Probable Mallory-Weiss tear: Patient came in with frequent vomiting.  FOBT positive.  Continue twice daily Protonix.  Continue as needed Zofran.  Discontinued Reglan.  Hemoglobin had dropped from 15-12 likely dilutional but stable since in the mid 12 g range.  Abnormal CT head: CT head had shown suspicion of acute/subacute left occipital lobe stroke.  However MRI brain ruled out acute stroke.  Neurology consulted and signed off.  Anxiety/depression: Has been resumed on sertraline and Seroquel.  QTc by EKG on 5/3: 490 ms.  Will repeat EKG today.  Fibromyalgia/?  Opioid dependence Currently off of oxycodone.  Did not complain of pain over the last 2 days.  Body mass index is 29.24 kg/m.   ACP Documents: None  present DVT prophylaxis: enoxaparin (LOVENOX) injection 40 mg Start: 07/13/22 1000 SCDs Start: 07/13/22 0739 SCDs Start: 07/12/22 2239     Code Status: Full Code:  Family Communication: Left VM  message for Mom. Disposition:  Mental status slowly improving but not back to baseline, completing course of IV antibiotics for pneumonia, pending further improvement, hopefully can DC home in the next 1 to 2 days.  Changed to medical bed.     Consultants:   PCCM Neurology  Procedures:   As above  Antimicrobials:   IV ceftriaxone and oral azithromycin   Subjective:  Alert and oriented.  Speaks in a soft tone, not sure if this is usual for him or new.  Cannot tell why he is in the hospital.  No pain reported.  Tells me that he follows with Dr. Christie Beckers" at [facility] for his pain management needs (confirmed on care everywhere, last visit in March 2024)  Objective:   Vitals:   07/16/22 0900 07/16/22 1000 07/16/22 1100 07/16/22 1200  BP: (!) 158/92 (!) 161/97 (!) 173/97   Pulse: 69     Resp: 17 (!) 21 (!) 21   Temp:    99.6 F (37.6 C)  TempSrc:    Oral  SpO2:      Weight:      Height:        General exam: Young male, moderately built and nourished lying comfortably propped up in bed without distress. Respiratory system: Clear to auscultation.  No increased work of breathing. Cardiovascular system: S1 & S2 heard, RRR. No JVD, murmurs, rubs, gallops or clicks. No pedal edema.  Telemetry personally reviewed: Sinus rhythm. Gastrointestinal system: Abdomen is nondistended, soft and nontender. No organomegaly or masses felt. Normal bowel sounds heard. Central nervous system: Alert and oriented. No focal neurological deficits. Extremities: Symmetric 5 x 5 power. Skin: No rashes, lesions or ulcers Psychiatry: Judgement and insight appear at least somewhat impaired. Mood & affect flat, low tone voice.    Data Reviewed:   I have personally reviewed following labs and imaging studies   CBC: Recent Labs  Lab 07/12/22 1925 07/12/22 1927 07/13/22 0220 07/14/22 0033 07/15/22 0540  WBC 17.2*  --  14.0* 11.6* 10.0  NEUTROABS 13.7*  --   --  7.7  --   HGB 16.4   < >  15.3 12.4* 12.6*  HCT 49.4   < > 45.1 38.1* 36.4*  MCV 91.5  --  90.6 92.5 87.7  PLT 416*  --  323 232 207   < > = values in this interval not displayed.    Basic Metabolic Panel: Recent Labs  Lab 07/12/22 1925 07/12/22 1927 07/12/22 2203 07/12/22 2235 07/13/22 0220 07/14/22 0033 07/15/22 0540 07/16/22 0338  NA 145 146*  146*  --  145 146* 142 142 138  K 2.8* 2.8*  2.8*  --  2.8* 2.9* 3.2* 3.3* 3.2*  CL 101 105  --   --  106 111 109 106  CO2 26  --   --   --  26 25 21* 20*  GLUCOSE 116* 117*  --   --  88 81 91 84  BUN 42* 41*  --   --  40* 28* 14 12  CREATININE 1.08 1.00  --   --  1.06 0.95 0.88 0.82  CALCIUM 9.9  --   --   --  9.2 8.3* 8.4* 8.5*  MG  --   --  2.7*  --  3.3*  2.5*  --  1.9  PHOS  --   --   --   --   --  3.0  --   --     Liver Function Tests: Recent Labs  Lab 07/12/22 1925 07/13/22 0220 07/14/22 0033  AST 35 33 23  ALT 24 24 20   ALKPHOS 116 101 81  BILITOT 0.8 0.9 1.0  PROT 8.7* 7.6 6.0*  ALBUMIN 4.6 4.0 3.1*    CBG: Recent Labs  Lab 07/15/22 2338 07/16/22 0344 07/16/22 0805  GLUCAP 92 87 84    Microbiology Studies:   Recent Results (from the past 240 hour(s))  Culture, blood (routine x 2)     Status: None (Preliminary result)   Collection Time: 07/12/22  6:50 PM   Specimen: BLOOD  Result Value Ref Range Status   Specimen Description BLOOD SITE NOT SPECIFIED  Final   Special Requests   Final    BOTTLES DRAWN AEROBIC AND ANAEROBIC Blood Culture results may not be optimal due to an inadequate volume of blood received in culture bottles   Culture   Final    NO GROWTH 4 DAYS Performed at [facility], [address], [place], [place] [address]    Report Status PENDING  Incomplete  Culture, blood (routine x 2)     Status: None (Preliminary result)   Collection Time: 07/12/22  7:25 PM   Specimen: BLOOD  Result Value Ref Range Status   Specimen Description BLOOD RIGHT ANTECUBITAL  Final   Special Requests   Final    BOTTLES  DRAWN AEROBIC AND ANAEROBIC Blood Culture adequate volume   Culture   Final    NO GROWTH 4 DAYS Performed at [facility], [address] [place], [place] [address]    Report Status PENDING  Incomplete  MRSA Next Gen by PCR, Nasal     Status: None   Collection Time: 07/13/22 12:24 AM   Specimen: Nasal Mucosa; Nasal Swab  Result Value Ref Range Status   MRSA by PCR Next Gen NOT DETECTED NOT DETECTED Final    Comment: (NOTE) The GeneXpert MRSA Assay (FDA approved for NASAL specimens only), is one component of a comprehensive MRSA colonization surveillance program. It is not intended to diagnose MRSA infection nor to guide or monitor treatment for MRSA infections. Test performance is not FDA approved in patients less than 68 years old. Performed at [facility], [address], [place], [place] [address]   SARS Coronavirus 2 by RT PCR (hospital order, performed in [facility] hospital lab) *cepheid single result test*     Status: None   Collection Time: 07/13/22  1:12 AM  Result Value Ref Range Status   SARS Coronavirus 2 by RT PCR NEGATIVE NEGATIVE Final    Comment: Performed at [facility], [address], [place], [place] [address]    Radiology Studies:  No results found.  Scheduled Meds:    azithromycin  500 mg Oral Daily   Chlorhexidine Gluconate Cloth  6 each Topical Daily   enoxaparin (LOVENOX) injection  40 mg Subcutaneous Daily   mouth rinse  15 mL Mouth Rinse BID   pantoprazole  40 mg Oral BID AC   QUEtiapine  150 mg Oral QHS   sertraline  100 mg Oral Daily    Continuous Infusions:    cefTRIAXone (ROCEPHIN)  IV Stopped (07/16/22 0550)   lactated ringers 10 mL/hr at 07/16/22 0700     LOS: 4 days  Marcellus Scott, MD,  FACP, FHM, [facility], [facility], [facility]   [facility] Lead     To contact the attending provider between 7A-7P or the covering provider during after hours 7P-7A, please log into the web  site [email or web address] and access using universal [place] password for that web site. If you do not have the password, please call the hospital operator.  07/16/2022, 1:37 PM

## 2022-07-17 DIAGNOSIS — G9340 Encephalopathy, unspecified: Secondary | ICD-10-CM | POA: Diagnosis not present

## 2022-07-17 DIAGNOSIS — J9601 Acute respiratory failure with hypoxia: Secondary | ICD-10-CM

## 2022-07-17 DIAGNOSIS — Z9189 Other specified personal risk factors, not elsewhere classified: Secondary | ICD-10-CM | POA: Diagnosis not present

## 2022-07-17 LAB — BASIC METABOLIC PANEL
Anion gap: 14 (ref 5–15)
BUN: 11 mg/dL (ref 6–20)
CO2: 18 mmol/L — ABNORMAL LOW (ref 22–32)
Calcium: 8.9 mg/dL (ref 8.9–10.3)
Chloride: 107 mmol/L (ref 98–111)
Creatinine, Ser: 0.83 mg/dL (ref 0.61–1.24)
GFR, Estimated: >60 mL/min (ref >60)
Glucose, Bld: 90 mg/dL (ref 70–99)
Potassium: 3.4 mmol/L — ABNORMAL LOW (ref 3.5–5.1)
Sodium: 139 mmol/L (ref 135–145)

## 2022-07-17 MED ORDER — OXYCODONE HCL 5 MG PO TABS
5.0000 mg | ORAL_TABLET | Freq: Three times a day (TID) | ORAL | Status: DC | PRN
  Administered 2022-07-17: 5 mg via ORAL
  Filled 2022-07-17: qty 1

## 2022-07-17 MED ORDER — GABAPENTIN 300 MG PO CAPS: 600.0000 mg | ORAL_CAPSULE | Freq: Three times a day (TID) | ORAL | 1 refills | Status: AC

## 2022-07-17 MED ORDER — POTASSIUM CHLORIDE CRYS ER 20 MEQ PO TBCR
40.0000 meq | EXTENDED_RELEASE_TABLET | ORAL | Status: AC
  Administered 2022-07-17 (×2): 40 meq via ORAL
  Filled 2022-07-17: qty 2

## 2022-07-17 MED ORDER — GABAPENTIN 300 MG PO CAPS
600.0000 mg | ORAL_CAPSULE | Freq: Three times a day (TID) | ORAL | Status: DC
  Administered 2022-07-17: 600 mg via ORAL
  Filled 2022-07-17: qty 2

## 2022-07-17 MED ORDER — HALOPERIDOL LACTATE 5 MG/ML IJ SOLN
5.0000 mg | Freq: Once | INTRAMUSCULAR | Status: AC | PRN
  Administered 2022-07-17: 5 mg via INTRAVENOUS
  Filled 2022-07-17: qty 1

## 2022-07-17 MED ORDER — OMEPRAZOLE 40 MG PO CPDR: 40.0000 mg | DELAYED_RELEASE_CAPSULE | Freq: Every morning | ORAL | 2 refills | Status: AC

## 2022-07-17 NOTE — Progress Notes (Signed)
Patient has become severely agitated. He called his mother who is reportedly on her way. Will continue attempts to redirect and see if his mother may be able to help calm him. May need to administer an antipsychotic if this is unsuccessful.

## 2022-07-17 NOTE — Progress Notes (Signed)
PIV removed. Discharge instructions completed. Patient verbalized understanding of medication regimen, follow up appointments and discharge instructions. Patient belongings gathered and packed to discharge.  

## 2022-07-17 NOTE — Progress Notes (Signed)
   07/16/22 2318  Assess: MEWS Score  Temp 98.2 F (36.8 C)  BP (!) 145/79  MAP (mmHg) 97  Pulse Rate (!) 116  Resp 17  SpO2 97 %  O2 Device Room Air  Assess: MEWS Score  MEWS Temp 0  MEWS Systolic 0  MEWS Pulse 2  MEWS RR 0  MEWS LOC 0  MEWS Score 2  MEWS Score Color Yellow  Assess: if the MEWS score is Yellow or Red  Were vital signs taken at a resting state? No  Focused Assessment No change from prior assessment  Does the patient meet 2 or more of the SIRS criteria? No  MEWS guidelines implemented  Yes, yellow  Treat  MEWS Interventions Considered administering scheduled or prn medications/treatments as ordered  Take Vital Signs  Increase Vital Sign Frequency  Yellow: Q2hr x1, continue Q4hrs until patient remains green for 12hrs  Escalate  MEWS: Escalate Yellow: Discuss with charge nurse and consider notifying provider and/or RRT  Notify: Charge Nurse/RN  Name of Charge Nurse/RN Notified Itmas RN  Assess: SIRS CRITERIA  SIRS Temperature  0  SIRS Pulse 1  SIRS Respirations  0  SIRS WBC 0  SIRS Score Sum  1     07/16/22 2318  Assess: MEWS Score  Temp 98.2 F (36.8 C)  BP (!) 145/79  MAP (mmHg) 97  Pulse Rate (!) 116  Resp 17  SpO2 97 %  O2 Device Room Air  Assess: MEWS Score  MEWS Temp 0  MEWS Systolic 0  MEWS Pulse 2  MEWS RR 0  MEWS LOC 0  MEWS Score 2  MEWS Score Color Yellow  Assess: if the MEWS score is Yellow or Red  Were vital signs taken at a resting state? No  Focused Assessment No change from prior assessment  Does the patient meet 2 or more of the SIRS criteria? No  MEWS guidelines implemented  Yes, yellow  Treat  MEWS Interventions Considered administering scheduled or prn medications/treatments as ordered  Take Vital Signs  Increase Vital Sign Frequency  Yellow: Q2hr x1, continue Q4hrs until patient remains green for 12hrs  Escalate  MEWS: Escalate Yellow: Discuss with charge nurse and consider notifying provider and/or RRT  Notify:  Charge Nurse/RN  Name of Charge Nurse/RN Notified Itmas RN  Assess: SIRS CRITERIA  SIRS Temperature  0  SIRS Pulse 1  SIRS Respirations  0  SIRS WBC 0  SIRS Score Sum  1

## 2022-07-17 NOTE — Discharge Summary (Signed)
Physician Discharge Summary  Brent Morales. NWG:[id number] DOB: Apr 01, 1975 DOA: 07/12/2022  PCP: Floreen Comber, MD  Admit date: 07/12/2022 Discharge date: 07/17/2022 Admitted From: Home Disposition: Home Recommendations for Outpatient Follow-up:  Follow up with PCP in 1 week Check CMP and CBC at follow-up Patient might be at risk for polypharmacy on oxycodone, gabapentin, Remeron and hydroxyzine. Please follow up on the following pending results: None  Home Health: Not indicated Equipment/Devices: Not indicated  Discharge Condition: Stable CODE STATUS: Full code  Follow-up Information     Kimel-Scott, Clydie Braun, MD. Schedule an appointment as soon as possible for a visit in 1 week(s).   Specialty: Internal Medicine Contact information: [address] [place] [address] [place] [address] [phone]                 Hospital course 47 year old male, reportedly lives with his mother, independent, mows lawn, medical history significant for anxiety, depression,?  Chronic pain/fibromyalgia, tobacco use, presented to the ED on 5/3 with altered mental status.  In ED, he was noted to be altered, localizing with LUE >RUE, throwing up coffee-ground emesis, not protecting airway, required intubation.  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  Admitted to ICU by CCM.  Neurology consulted.  Extubated 5/5.  Stabilized and care transferred to [facility] and progressive floor on 5/6.   Patient's encephalopathy/confusion and physical deconditioning improved.  Evaluated and cleared for discharge by therapy.  Of note, patient is at risk for polypharmacy on oxycodone, gabapentin, Remeron and hydroxyzine.  Decreased on gabapentin.  Recommend reviewing the rest of his medication at follow-up.  See individual problem list below for more.   Problems addressed during this hospitalization Principal Problem:   Stroke [facility]) Active Problems:   Acute respiratory failure (HCC)   Encephalopathy acute    Acute toxic metabolic encephalopathy: Seems to have resolved.  Suspect polypharmacy..  Workup including EtOH, ammonia, CT head, UDS and EEG unrevealing.  UDS positive for marijuana and benzodiazepines.  Completed high-dose thiamine course.  Encephalopathy resolved.  Acute respiratory failure with hypoxia: Due to encephalopathy and emesis.  Intubated for airway protection.  Extubated on 5/6.  Community-acquired pneumonia: Completed 5 days of IV ceftriaxone and Zithromax.  Acute viral gastroenteritis: CT suggestive of esophagitis. -Advised to take Prozac 40 mg daily before breakfast.   Hypokalemia:  Continue to replace aggressively and follow.  Magnesium 1.9.   Probable Mallory-Weiss tear: -PPI as above.   Abnormal CT head: CT head had shown suspicion of acute/subacute left occipital lobe stroke.  However MRI brain ruled out acute stroke.  Neurology consulted and signed off.   Anxiety/depression: -Discontinue Zoloft not on this at home. -Continue home Seroquel.   -Decreased gabapentin due to risk of encephalopathy.   -Continue Remeron.   Fibromyalgia/?  Opioid dependence -Continue home meds  At risk for polypharmacy -Decrease gabapentin -Review meds at follow-up and adjust as appropriate  Hypokalemia: Replenished prior to discharge.           Time spent 35 minutes  Vital signs Vitals:   07/17/22 0335 07/17/22 0500 07/17/22 0753 07/17/22 1113  BP: (!) 157/85  (!) 157/88 (!) 140/81  Pulse: (!) 102  98 98  Temp: 98.2 F (36.8 C)  99.8 F (37.7 C) 98.7 F (37.1 C)  Resp: 18  18 18   Height:      Weight:  88.3 kg    SpO2: 98%  100% 99%  TempSrc: Oral  Oral Oral  BMI (Calculated):  26.4  Discharge exam  GENERAL: No apparent distress.  Nontoxic. HEENT: MMM.  Vision and hearing grossly intact.  NECK: Supple.  No apparent JVD.  RESP:  No IWOB.  Fair aeration bilaterally. CVS:  RRR. Heart sounds normal.  ABD/GI/GU: BS+. Abd soft, NTND.  MSK/EXT:  Moves  extremities. No apparent deformity. No edema.  SKIN: no apparent skin lesion or wound NEURO: Awake and alert. Oriented appropriately.  No apparent focal neuro deficit. PSYCH: Calm. Normal affect.   Discharge Instructions Discharge Instructions     Diet - low sodium heart healthy   Complete by: As directed    Continue soft diet   Discharge instructions   Complete by: As directed    It has been a pleasure taking care of you!  You were hospitalized due to altered mental status and pneumonia for which you have been treated.  you have completed antibiotic course for pneumonia.  Medication such as oxycodone, gabapentin, Remeron, hydroxyzine and quetiapine can increase your risk of confusion and sedation.  We have decreased to gabapentin.  We strongly recommend you discuss the rest of your medications with your primary care doctor or prescriber.  We also recommend trying Prilosec 40 mg daily every day about 30 minutes before breakfast for your stomach.  Follow-up with your primary care doctor in 1 to 2 weeks or sooner if needed.   Take care,   Increase activity slowly   Complete by: As directed    Increase activity slowly   Complete by: As directed       Allergies as of 07/17/2022   No Known Allergies      Medication List     STOP taking these medications    ibuprofen 200 MG tablet Commonly known as: ADVIL       TAKE these medications    gabapentin 300 MG capsule Commonly known as: NEURONTIN Take 2 capsules (600 mg total) by mouth 3 (three) times daily. What changed: how much to take   hydrOXYzine 25 MG tablet Commonly known as: ATARAX Take 25 mg by mouth at bedtime.   mirtazapine 30 MG disintegrating tablet Commonly known as: REMERON SOL-TAB Take 30 mg by mouth at bedtime.   omeprazole 40 MG capsule Commonly known as: PRILOSEC Take 1 capsule (40 mg total) by mouth in the morning. About 30 minutes before breakfast What changed:  medication strength how much to  take when to take this reasons to take this additional instructions   ondansetron 4 MG tablet Commonly known as: ZOFRAN Take 1 tablet (4 mg total) by mouth every 4 (four) hours as needed for nausea.   oxyCODONE 15 MG immediate release tablet Commonly known as: ROXICODONE Take 15 mg by mouth 2 (two) times daily.   QUEtiapine 100 MG tablet Commonly known as: SEROQUEL Take 100 mg by mouth at bedtime.        Consultations: Neurology Pulmonology  Procedures/Studies: Intubation from 5/5-5/6   DG Abd Portable 1V  Result Date: 07/13/2022 CLINICAL DATA:  OG tube placement. EXAM: PORTABLE ABDOMEN - 1 VIEW COMPARISON:  CT yesterday. FINDINGS: Tip of the enteric tube is in the proximal stomach, the side port is in the region of the distal esophagus. Advancement of 3-5 cm would lead to optimal placement. Nonobstructed upper abdominal bowel gas pattern. IMPRESSION: Tip of the enteric tube in the proximal stomach, side-port in the region of the distal esophagus. Advancement of 3-5 cm would lead to optimal placement. Electronically Signed   By: Ivette Loyal.D.  On: 07/13/2022 09:58   EEG adult  Result Date: 07/13/2022 Charlsie Quest, MD     07/13/2022  1:28 PM Patient Name: Brent Morales. MRN: [id number] Epilepsy Attending: Charlsie Quest Referring Physician/Provider: Gordy Councilman, MD Date: 07/13/2022 Duration: 22.16 mins Patient history: 47yo M with ams getting eeg to evaluate for seizure Level of alertness: Awake AEDs during EEG study: propofol Technical aspects: This EEG study was done with scalp electrodes positioned according to the 10-20 International system of electrode placement. Electrical activity was reviewed with band pass filter of 1-70Hz , sensitivity of 7 uV/mm, display speed of 35mm/sec with a 60Hz  notched filter applied as appropriate. EEG data were recorded continuously and digitally stored.  Video monitoring was available and reviewed as appropriate. Description: The  posterior dominant rhythm consists of 7.5 Hz activity of moderate voltage (25-35 uV) seen predominantly in posterior head regions, symmetric and reactive to eye opening and eye closing. EEG showed intermittent generalized predominantly 5 to 7 Hz theta slowing admixed with 2-3hz  delta slowing. Hyperventilation and photic stimulation were not performed.   ABNORMALITY - Intermittent slow, generalized IMPRESSION: This study is suggestive of mild diffuse encephalopathy, nonspecific etiology. No seizures or epileptiform discharges were seen throughout the recording. Charlsie Quest   MR BRAIN WO CONTRAST  Result Date: 07/13/2022 CLINICAL DATA:  Stroke suspected EXAM: MRI HEAD WITHOUT CONTRAST TECHNIQUE: Multiplanar, multiecho pulse sequences of the brain and surrounding structures were obtained without intravenous contrast. COMPARISON:  12/19/2003 MRI head, correlation is also made with 07/12/2022 CT head FINDINGS: Brain: No restricted diffusion to suggest acute or subacute infarct. No acute hemorrhage, mass, mass effect, or midline shift. No hydrocephalus or extra-axial collection. Partial empty sella. Normal craniocervical junction. No hemosiderin deposition to suggest remote hemorrhage. Scattered small foci of T2 hyperintense signal in the right frontal and left parietal lobe, which are nonspecific and may represent the sequela of prior insult, chronic migraines, or early small vessel ischemic disease. Vascular: Normal arterial flow voids. Skull and upper cervical spine: Normal marrow signal. Sinuses/Orbits: Mucosal thickening in the left greater than right maxillary sinus, with air-fluid level, with additional air-fluid levels and mild mucosal thickening in the ethmoid air cells. Fluid is also noted in the nasopharynx. These findings may all be related to intubation. No acute finding in the orbits. Other: The mastoid air cells are well aerated. IMPRESSION: No acute intracranial process. No evidence of acute or  subacute infarct. Electronically Signed   By: Wiliam Ke M.D.   On: 07/13/2022 00:18   CT CHEST ABDOMEN PELVIS W CONTRAST  Result Date: 07/12/2022 CLINICAL DATA:  Sepsis.  Mental status change.  Unknown cause EXAM: CT CHEST, ABDOMEN, AND PELVIS WITH CONTRAST TECHNIQUE: Multidetector CT imaging of the chest, abdomen and pelvis was performed following the standard protocol during bolus administration of intravenous contrast. RADIATION DOSE REDUCTION: This exam was performed according to the departmental dose-optimization program which includes automated exposure control, adjustment of the mA and/or kV according to patient size and/or use of iterative reconstruction technique. CONTRAST:  75mL OMNIPAQUE IOHEXOL 350 MG/ML SOLN COMPARISON:  CT abdomen pelvis 10/01/2014, CT angio chest abdomen pelvis 01/01/2017 FINDINGS: CT CHEST FINDINGS Cardiovascular: Normal heart size. No significant pericardial effusion. The thoracic aorta is normal in caliber. No atherosclerotic plaque of the thoracic aorta. No coronary artery calcifications. The main pulmonary artery is normal in caliber. No central or proximal segmental pulmonary embolus. Mediastinum/Nodes: No enlarged mediastinal, hilar, or axillary lymph nodes. Thyroid gland trachea demonstrate no significant  findings. Persistent distal esophageal wall thickening. Lungs/Pleura: Lingular atelectasis. No focal consolidation. No pulmonary nodule. No pulmonary mass. No pleural effusion. No pneumothorax. Musculoskeletal: No chest wall abnormality. No suspicious lytic or blastic osseous lesions. No acute displaced fracture. Chronic similar-appearing T7 compression fracture. Multilevel degenerative changes of the spine. CT ABDOMEN PELVIS FINDINGS Hepatobiliary: No focal liver abnormality. No gallstones, gallbladder wall thickening, or pericholecystic fluid. No biliary dilatation. Pancreas: Diffusely atrophic. No focal lesion. Otherwise normal pancreatic contour. No surrounding  inflammatory changes. No main pancreatic ductal dilatation. Spleen: Normal in size without focal abnormality. Adrenals/Urinary Tract: No adrenal nodule bilaterally. Bilateral kidneys enhance symmetrically. No hydronephrosis. No hydroureter. The urinary bladder is unremarkable. Stomach/Bowel: Stomach is within normal limits. No evidence of bowel wall thickening or dilatation. Appendix appears normal. Vascular/Lymphatic: No abdominal aorta or iliac aneurysm. Mild atherosclerotic plaque of the aorta and its branches. No abdominal, pelvic, or inguinal lymphadenopathy. Reproductive: Prostate is unremarkable. Other: No intraperitoneal free fluid. No intraperitoneal free gas. No organized fluid collection. Musculoskeletal: No abdominal wall hernia or abnormality. No suspicious lytic or blastic osseous lesions. No acute displaced fracture. Lines and tubes: Endotracheal tube with tip terminating approximately 2 cm above the carina. Enteric tube coursing below the hemidiaphragm with tip and side port within the gastric lumen. Foley catheter tip and balloon terminating within the urinary bladder lumen. IMPRESSION: 1. No acute intrathoracic, intra-abdominal normal or intrapelvic abnormality. 2. Other imaging findings of potential clinical significance: Persistent distal esophageal wall thickening. Correlate with signs and symptoms of esophagitis. Consider direct visualization. Electronically Signed   By: Tish Frederickson M.D.   On: 07/12/2022 21:45   CT Head Wo Contrast  Result Date: 07/12/2022 CLINICAL DATA:  Mental status change, unknown cause EXAM: CT HEAD WITHOUT CONTRAST TECHNIQUE: Contiguous axial images were obtained from the base of the skull through the vertex without intravenous contrast. RADIATION DOSE REDUCTION: This exam was performed according to the departmental dose-optimization program which includes automated exposure control, adjustment of the mA and/or kV according to patient size and/or use of iterative  reconstruction technique. COMPARISON:  CT head 10/04/2014, CT head 05/12/2014, CT angio chest abdomen pelvis 01/02/2019 FINDINGS: Brain: Loss of gray-white matter differentiation along the left occipital lobe (6:30, 3:15). No parenchymal hemorrhage. No mass lesion. No extra-axial collection. No mass effect or midline shift. No hydrocephalus. Basilar cisterns are patent. Vascular: No hyperdense vessel. Skull: No acute fracture or focal lesion. Sinuses/Orbits: Bilateral maxillary mucosal thickening. Left ethmoid sinus mucosal thickening. Right sphenoid sinus mucosal thickening. Otherwise paranasal sinuses and mastoid air cells are clear. Secretions within the nasopharynx. The orbits are unremarkable. Other: Partially visualized enteric and endotracheal tube. IMPRESSION: 1. Suggestion of left occipital lobe acute to subacute infarction. Recommend MRI brain noncontrast for further evaluation. 2. No intracranial hemorrhage. These results were called by telephone at the time of interpretation on 07/12/2022 at 9:39 pm to provider JOSHUA LONG , who verbally acknowledged these results. Electronically Signed   By: Tish Frederickson M.D.   On: 07/12/2022 21:39   DG Chest Portable 1 View  Result Date: 07/12/2022 CLINICAL DATA:  Respiratory failure EXAM: PORTABLE CHEST 1 VIEW COMPARISON:  11/19/2020 FINDINGS: Endotracheal tube seen 4.0 cm above the carina. Retaining cuff of the endotracheal tube expands the airway at the thoracic inlet. Nasogastric tube extends into the upper abdomen beyond the margin of the examination. Lungs are clear. No pneumothorax or pleural effusion. Cardiac size within normal limits. Pulmonary vascularity is normal. IMPRESSION: 1. Support tubes in appropriate position. Retaining cuff of  the endotracheal tube expands the airway at the thoracic inlet. 2. No active disease. Electronically Signed   By: Helyn Numbers M.D.   On: 07/12/2022 20:48       The results of significant diagnostics from this  hospitalization (including imaging, microbiology, ancillary and laboratory) are listed below for reference.     Microbiology: Recent Results (from the past 240 hour(s))  Culture, blood (routine x 2)     Status: None   Collection Time: 07/12/22  6:50 PM   Specimen: BLOOD  Result Value Ref Range Status   Specimen Description BLOOD SITE NOT SPECIFIED  Final   Special Requests   Final    BOTTLES DRAWN AEROBIC AND ANAEROBIC Blood Culture results may not be optimal due to an inadequate volume of blood received in culture bottles   Culture   Final    NO GROWTH 5 DAYS Performed at [facility], [address] [place], [place] [address]    Report Status 07/17/2022 FINAL  Final  Culture, blood (routine x 2)     Status: None   Collection Time: 07/12/22  7:25 PM   Specimen: BLOOD  Result Value Ref Range Status   Specimen Description BLOOD RIGHT ANTECUBITAL  Final   Special Requests   Final    BOTTLES DRAWN AEROBIC AND ANAEROBIC Blood Culture adequate volume   Culture   Final    NO GROWTH 5 DAYS Performed at [facility], [address], [place], [place] [address]    Report Status 07/17/2022 FINAL  Final  MRSA Next Gen by PCR, Nasal     Status: None   Collection Time: 07/13/22 12:24 AM   Specimen: Nasal Mucosa; Nasal Swab  Result Value Ref Range Status   MRSA by PCR Next Gen NOT DETECTED NOT DETECTED Final    Comment: (NOTE) The GeneXpert MRSA Assay (FDA approved for NASAL specimens only), is one component of a comprehensive MRSA colonization surveillance program. It is not intended to diagnose MRSA infection nor to guide or monitor treatment for MRSA infections. Test performance is not FDA approved in patients less than 78 years old. Performed at [facility], [address], [place], [place] [address]   SARS Coronavirus 2 by RT PCR (hospital order, performed in [facility] hospital lab) *cepheid single result test*     Status: None   Collection Time: 07/13/22  1:12  AM  Result Value Ref Range Status   SARS Coronavirus 2 by RT PCR NEGATIVE NEGATIVE Final    Comment: Performed at [facility], [address] [place], [place] [address]     Labs:  CBC: Recent Labs  Lab 07/12/22 1925 07/12/22 1927 07/12/22 2235 07/13/22 0220 07/14/22 0033 07/15/22 0540  WBC 17.2*  --   --  14.0* 11.6* 10.0  NEUTROABS 13.7*  --   --   --  7.7  --   HGB 16.4 17.0  16.7 14.3 15.3 12.4* 12.6*  HCT 49.4 50.0  49.0 42.0 45.1 38.1* 36.4*  MCV 91.5  --   --  90.6 92.5 87.7  PLT 416*  --   --  323 232 207   BMP &GFR Recent Labs  Lab 07/12/22 2203 07/12/22 2235 07/13/22 0220 07/14/22 0033 07/15/22 0540 07/16/22 0338 07/17/22 0329  NA  --    < > 146* 142 142 138 139  K  --    < > 2.9* 3.2* 3.3* 3.2* 3.4*  CL  --   --  106 111 109 106 107  CO2  --   --  26 25 21* 20* 18*  GLUCOSE  --   --  88 81 91 84 90  BUN  --   --  40* 28* 14 12 11   CREATININE  --   --  1.06 0.95 0.88 0.82 0.83  CALCIUM  --   --  9.2 8.3* 8.4* 8.5* 8.9  MG 2.7*  --  3.3* 2.5*  --  1.9  --   PHOS  --   --   --  3.0  --   --   --    < > = values in this interval not displayed.   Estimated Creatinine Clearance: 123.4 mL/min (by C-G formula based on SCr of 0.83 mg/dL). Liver & Pancreas: Recent Labs  Lab 07/12/22 1925 07/13/22 0220 07/14/22 0033  AST 35 33 23  ALT 24 24 20   ALKPHOS 116 101 81  BILITOT 0.8 0.9 1.0  PROT 8.7* 7.6 6.0*  ALBUMIN 4.6 4.0 3.1*   Recent Labs  Lab 07/12/22 1925  LIPASE 38   Recent Labs  Lab 07/12/22 2057 07/13/22 0220  AMMONIA 35 44*   Diabetic: No results for input(s): "HGBA1C" in the last 72 hours. Recent Labs  Lab 07/15/22 1525 07/15/22 2007 07/15/22 2338 07/16/22 0344 07/16/22 0805  GLUCAP 79 91 92 87 84   Cardiac Enzymes: No results for input(s): "CKTOTAL", "CKMB", "CKMBINDEX", "TROPONINI" in the last 168 hours. No results for input(s): "PROBNP" in the last 8760 hours. Coagulation Profile: No results for input(s): "INR",  "PROTIME" in the last 168 hours. Thyroid Function Tests: Recent Labs    07/15/22 0540  FREET4 1.14*  T3FREE 1.8*   Lipid Profile: No results for input(s): "CHOL", "HDL", "LDLCALC", "TRIG", "CHOLHDL", "LDLDIRECT" in the last 72 hours. Anemia Panel: No results for input(s): "VITAMINB12", "FOLATE", "FERRITIN", "TIBC", "IRON", "RETICCTPCT" in the last 72 hours. Urine analysis:    Component Value Date/Time   COLORURINE YELLOW 07/13/2022 0125   APPEARANCEUR CLEAR 07/13/2022 0125   LABSPEC >1.046 (H) 07/13/2022 0125   PHURINE 5.0 07/13/2022 0125   GLUCOSEU NEGATIVE 07/13/2022 0125   HGBUR SMALL (A) 07/13/2022 0125   HGBUR negative 10/07/2008 0752   BILIRUBINUR NEGATIVE 07/13/2022 0125   BILIRUBINUR negative 04/03/2015 1740   KETONESUR 20 (A) 07/13/2022 0125   PROTEINUR 30 (A) 07/13/2022 0125   UROBILINOGEN 0.2 04/03/2015 1740   UROBILINOGEN 1.0 10/01/2014 2010   NITRITE NEGATIVE 07/13/2022 0125   LEUKOCYTESUR NEGATIVE 07/13/2022 0125   Sepsis Labs: Invalid input(s): "PROCALCITONIN", "LACTICIDVEN"   SIGNED:  Almon Hercules, MD  [facility] 07/17/2022, 4:52 PM

## 2022-07-17 NOTE — Progress Notes (Signed)
Around 0600, patient became very agitated asking for his Xanax. I explained that he did not have Xanax on his MAR but I would contact the doctor. I contacted Dr. Antionette Char and he stated that he could order Haldol for patient if necessary. Patient began hitting things and pounding the telephone onto the side table. He requested that I call his mother. I dialed the phone and they spoke for about 10 minutes. When the call was finished he stated that his mother was coming to pick him up and she lived about 30 minutes away.  Patient became even more agitated so I administered the haldol. Patient calmed down and is now asleep in his bed. Mother has not arrived yet.

## 2022-07-17 NOTE — Progress Notes (Signed)
Physical Therapy Treatment Patient Details Name: Brent Morales. MRN: [id number] DOB: 15-Sep-1975 Today's Date: 07/17/2022   History of Present Illness 47 year old male presents to ED on 5/03 with AMS; began vomiting coffee ground emesis and intubated 5/3; CT head w/ L occipital lobe stroke, however MRI no acute process;  acute gastroenteritis; extubated 5/5;  PMH anxiety/depression, cervical stenosis, pancreatitis    PT Comments    Pt is displaying improved stability with standing mobility today, needing only min guard-supervision for safety to ambulate without UE support and navigate stairs with handrail support. He does display balance deficits though and was encouraged to mobilize frequently at home to improve this and his strength and endurance. Pt needed cues to clear his R foot when ambulating. Will continue to follow acutely.     Recommendations for follow up therapy are one component of a multi-disciplinary discharge planning process, led by the attending physician.  Recommendations may be updated based on patient status, additional functional criteria and insurance authorization.  Follow Up Recommendations       Assistance Recommended at Discharge Frequent or constant Supervision/Assistance (unless cognition clears)  Patient can return home with the following A little help with bathing/dressing/bathroom;Assistance with cooking/housework;Direct supervision/assist for medications management;Direct supervision/assist for financial management;Assist for transportation;Help with stairs or ramp for entrance   Equipment Recommendations  None recommended by PT    Recommendations for Other Services       Precautions / Restrictions Precautions Precautions: Fall Restrictions Weight Bearing Restrictions: No     Mobility  Bed Mobility               General bed mobility comments: Pt in chair upon arrival    Transfers Overall transfer level: Needs assistance Equipment  used: None Transfers: Sit to/from Stand, Bed to chair/wheelchair/BSC Sit to Stand: Min guard   Step pivot transfers: Min guard       General transfer comment: stood x 8 reps from EOB and 1x from recliner without LOB, min guard for safety    Ambulation/Gait Ambulation/Gait assistance: Min guard, Supervision Gait Distance (Feet): 380 Feet Assistive device: None Gait Pattern/deviations: Step-through pattern, Decreased stride length, Decreased dorsiflexion - right Gait velocity: reduced Gait velocity interpretation: <1.8 ft/sec, indicate of risk for recurrent falls   General Gait Details: Pt ambulates with a slow cadence and noted external rotation of L leg and intermittent external rotation of R leg. Noted decreased R foot clearance at times, needing cues to improve clearance and heel strike. When cued to weave in and out of x3 squares on the floor, pt took a staggering step to maintain his balance. Pt unable to increase speeds much when cued. Min guard-supervision for safety   Stairs Stairs: Yes Stairs assistance: Min guard Stair Management: One rail Right, One rail Left, Two rails, Step to pattern, Alternating pattern, Forwards Number of Stairs: 4 General stair comments: Ascends with R rail and descends with L rail, step-to pattern the first x2 stairs but then changed to bil handrail support and reciprocal stepping ascending and bil handrail support with step-to pattern descending the final x2 stairs. No LOB, min guard for safety   Wheelchair Mobility    Modified Rankin (Stroke Patients Only)       Balance Overall balance assessment: Mild deficits observed, not formally tested  Cognition Arousal/Alertness: Awake/alert Behavior During Therapy: Flat affect (occasionally smiling/laughing) Overall Cognitive Status: Impaired/Different from baseline Area of Impairment: Attention, Memory, Following commands, Awareness,  Problem solving, Safety/judgement                   Current Attention Level: Selective Memory: Decreased short-term memory Following Commands: Follows one step commands consistently, Follows one step commands with increased time, Follows multi-step commands with increased time Safety/Judgement: Decreased awareness of deficits, Decreased awareness of safety Awareness: Emergent Problem Solving: Slow processing, Difficulty sequencing, Requires verbal cues General Comments: A&Ox4. Pt more verbal, but slow to respond, needing extra time and repetition of cues/education.        Exercises Other Exercises Other Exercises: sit <> stand x8 serial reps    General Comments General comments (skin integrity, edema, etc.): encouraged pt to mobilize frequently to improve his strength and stability and have family supervise higher cognitive tasks, he verbalized understanding      Pertinent Vitals/Pain Pain Assessment Pain Assessment: Faces Faces Pain Scale: Hurts a little bit Pain Location: generalized with mobility Pain Descriptors / Indicators: Discomfort Pain Intervention(s): Limited activity within patient's tolerance, Monitored during session    Home Living                          Prior Function            PT Goals (current goals can now be found in the care plan section) Acute Rehab PT Goals Patient Stated Goal: to go home PT Goal Formulation: With patient Time For Goal Achievement: 07/29/22 Potential to Achieve Goals: Good Progress towards PT goals: Progressing toward goals    Frequency    Min 3X/week      PT Plan Current plan remains appropriate    Co-evaluation              AM-PAC PT "6 Clicks" Mobility   Outcome Measure  Help needed turning from your back to your side while in a flat bed without using bedrails?: A Little Help needed moving from lying on your back to sitting on the side of a flat bed without using bedrails?: A Little Help  needed moving to and from a bed to a chair (including a wheelchair)?: A Little Help needed standing up from a chair using your arms (e.g., wheelchair or bedside chair)?: A Little Help needed to walk in hospital room?: A Little Help needed climbing 3-5 steps with a railing? : A Little 6 Click Score: 18    End of Session Equipment Utilized During Treatment: Gait belt Activity Tolerance: Patient tolerated treatment well Patient left: with call bell/phone within reach;in chair;Other (comment) (RN reported no need for chair alarm prior to session beginning) Nurse Communication: Mobility status PT Visit Diagnosis: Difficulty in walking, not elsewhere classified (R26.2);Unsteadiness on feet (R26.81);Other abnormalities of gait and mobility (R26.89)     Time: 1610-9604 PT Time Calculation (min) (ACUTE ONLY): 15 min  Charges:  $Gait Training: 8-22 mins                     Raymond Gurney, PT, DPT Acute Rehabilitation Services  Office: [phone]    Jewel Baize 07/17/2022, 1:25 PM

## 2022-07-17 NOTE — TOC Transition Note (Signed)
Transition of Care [facility]) - CM/SW Discharge Note   Patient Details  Name: Brent Morales. MRN: [id number] Date of Birth: 11/13/1975  Transition of Care [facility]) CM/SW Contact:  Kermit Balo, RN Phone Number: 07/17/2022, 12:04 PM   Clinical Narrative:    Pt is discharging home with his mom. No f/u per PT.  Pt has transportation home.    Final next level of care: Home/Self Care Barriers to Discharge: No Barriers Identified   Patient Goals and CMS Choice      Discharge Placement                         Discharge Plan and Services Additional resources added to the After Visit Summary for                                       Social Determinants of Health (SDOH) Interventions SDOH Screenings   Tobacco Use: High Risk (07/12/2022)     Readmission Risk Interventions     No data to display

## 2022-07-17 NOTE — Progress Notes (Signed)
Occupational Therapy Treatment Patient Details Name: Brent Morales. MRN: [id number] DOB: 06/18/1975 Today's Date: 07/17/2022   History of present illness 47 year old male presents to ED on 5/03 with AMS; began vomiting coffee ground emesis and intubated 5/3; CT head w/ L occipital lobe stroke, however MRI no acute process;  acute gastroenteritis; extubated 5/5;  PMH anxiety/depression, cervical stenosis, pancreatitis   OT comments  Patient demonstrated good progress with OT. Patient met goals for grooming, bathing, and dressing. Patient able to perform grooming and bathing standing at sink with supervision and stated fatigued at end of bathing and performed dressing seated/standing from recliner with supervision. Patient is expected to discharge home with family to assist.    Recommendations for follow up therapy are one component of a multi-disciplinary discharge planning process, led by the attending physician.  Recommendations may be updated based on patient status, additional functional criteria and insurance authorization.    Assistance Recommended at Discharge Frequent or constant Supervision/Assistance  Patient can return home with the following  A little help with walking and/or transfers;A little help with bathing/dressing/bathroom;Assistance with cooking/housework;Assist for transportation;Help with stairs or ramp for entrance;Direct supervision/assist for financial management;Direct supervision/assist for medications management   Equipment Recommendations  None recommended by OT    Recommendations for Other Services      Precautions / Restrictions Precautions Precautions: Fall Restrictions Weight Bearing Restrictions: No       Mobility Bed Mobility Overal bed mobility: Needs Assistance             General bed mobility comments: Pt in chair upon arrival    Transfers Overall transfer level: Needs assistance Equipment used: None Transfers: Sit to/from Stand, Bed  to chair/wheelchair/BSC Sit to Stand: Supervision     Step pivot transfers: Supervision     General transfer comment: remained hands off due to expected to discharge home, supervision for mobility and transfers with no LOB     Balance Overall balance assessment: Mild deficits observed, not formally tested                                         ADL either performed or assessed with clinical judgement   ADL Overall ADL's : Needs assistance/impaired     Grooming: Wash/dry hands;Wash/dry face;Supervision/safety;Standing Grooming Details (indicate cue type and reason): at sink Upper Body Bathing: Supervision/ safety;Cueing for sequencing;Standing Upper Body Bathing Details (indicate cue type and reason): at sink with cues to wet wash cloth and to add soap Lower Body Bathing: Supervison/ safety;Sit to/from stand Lower Body Bathing Details (indicate cue type and reason): at sink with cues for sequencing Upper Body Dressing : Supervision/safety;Sitting   Lower Body Dressing: Supervision/safety;Sit to/from stand Lower Body Dressing Details (indicate cue type and reason): donned underwear and pants               General ADL Comments: required cues for sequencing and to initate tasks    Extremity/Trunk Assessment              Vision       Perception     Praxis      Cognition Arousal/Alertness: Awake/alert Behavior During Therapy: Flat affect Overall Cognitive Status: Impaired/Different from baseline Area of Impairment: Attention, Memory, Following commands, Awareness, Problem solving, Safety/judgement                   Current Attention Level: Selective Memory:  Decreased short-term memory Following Commands: Follows one step commands consistently, Follows one step commands with increased time, Follows multi-step commands with increased time Safety/Judgement: Decreased awareness of deficits, Decreased awareness of safety Awareness:  Emergent Problem Solving: Slow processing, Difficulty sequencing, Requires verbal cues General Comments: alert and oriented, eager to return home        Exercises      Shoulder Instructions       General Comments encouraged pt to mobilize frequently to improve his strength and stability and have family supervise higher cognitive tasks, he verbalized understanding    Pertinent Vitals/ Pain       Pain Assessment Pain Assessment: Faces Pain Location: generalized with mobility Pain Descriptors / Indicators: Discomfort Pain Intervention(s): Monitored during session, Limited activity within patient's tolerance, Repositioned  Home Living                                          Prior Functioning/Environment              Frequency  Min 1X/week        Progress Toward Goals  OT Goals(current goals can now be found in the care plan section)  Progress towards OT goals: Progressing toward goals  Acute Rehab OT Goals Patient Stated Goal: go home OT Goal Formulation: With patient Time For Goal Achievement: 07/30/22 Potential to Achieve Goals: Good ADL Goals Pt Will Perform Grooming: with set-up;with supervision;standing Pt Will Perform Upper Body Bathing: with set-up;with supervision;sitting;standing Pt Will Perform Lower Body Bathing: with set-up;with supervision;sit to/from stand Pt Will Perform Upper Body Dressing: with set-up;with supervision;sitting Pt Will Perform Lower Body Dressing: with set-up;with supervision;sit to/from stand Pt Will Transfer to Toilet: with supervision;ambulating;regular height toilet Pt Will Perform Toileting - Clothing Manipulation and hygiene: with supervision;sit to/from stand  Plan Discharge plan remains appropriate    Co-evaluation                 AM-PAC OT "6 Clicks" Daily Activity     Outcome Measure   Help from another person eating meals?: A Little Help from another person taking care of personal  grooming?: A Little Help from another person toileting, which includes using toliet, bedpan, or urinal?: A Little Help from another person bathing (including washing, rinsing, drying)?: A Little Help from another person to put on and taking off regular upper body clothing?: A Little Help from another person to put on and taking off regular lower body clothing?: A Little 6 Click Score: 18    End of Session    OT Visit Diagnosis: Unsteadiness on feet (R26.81);Other abnormalities of gait and mobility (R26.89);Other symptoms and signs involving cognitive function   Activity Tolerance Patient tolerated treatment well   Patient Left in chair   Nurse Communication Mobility status        Time: 1212-1229 OT Time Calculation (min): 17 min  Charges: OT General Charges $OT Visit: 1 Visit OT Treatments $Self Care/Home Management : 8-22 mins  Brent Morales, OTA Acute Rehabilitation Services  Office [phone]   Dewain Penning 07/17/2022, 1:59 PM

## 2022-07-17 NOTE — Care Management Important Message (Signed)
Important Message  Patient Details  Name: Brent Morales. MRN: [id number] Date of Birth: May 01, 1975   Medicare Important Message Given:  Yes     Virdie Penning Stefan Church 07/17/2022, 2:37 PM

## 2022-08-22 ENCOUNTER — Ambulatory Visit
Admission: EM | Admit: 2022-08-22 | Discharge: 2022-08-22 | Disposition: A | Discharge status: Home / Self Care | Payer: Medicare Other | Attending: Nurse Practitioner | Admitting: Nurse Practitioner

## 2022-08-22 DIAGNOSIS — R600 Localized edema: Secondary | ICD-10-CM | POA: Diagnosis not present

## 2022-08-22 MED ORDER — POTASSIUM CHLORIDE ER 10 MEQ PO TBCR: 10.0000 meq | EXTENDED_RELEASE_TABLET | Freq: Every day | ORAL | 0 refills | Status: AC

## 2022-08-22 MED ORDER — TORSEMIDE 20 MG PO TABS: 20.0000 mg | ORAL_TABLET | Freq: Every day | ORAL | 0 refills | Status: AC

## 2022-08-22 NOTE — ED Triage Notes (Signed)
Pt presents with bilateral foot pain. Pt states he woke up with foot pain and head laceration.

## 2022-08-22 NOTE — Discharge Instructions (Addendum)
Brent Morales you have been diagnosed with localized edema bilateral lower extremities.  You have been started on furosemide 20 mg daily for the next 5 days along with a potassium supplement 10 mEq daily due to your recent history of hypokalemia.  The recommendation is for you to keep your legs elevated while sitting for long..  You are also encouraged to wear compression hose or socks as discussed.  The recommendation is for you not to sleep sitting up with your feet dangling overnight.  This causes bilateral swelling.  The recommendation is to consider smoking cessation due to the possible changes in your lower extremities.  Please see the attached for more information on edema.  Please continue all other medications as directed. You need to make sure that you follow-up with your primary care as scheduled and make her aware that she placed on a 5-day treatment of Torsemide for the edema.

## 2022-08-22 NOTE — ED Provider Notes (Signed)
[facility]    CSN: [id number] Arrival date & time: 08/22/22  1007      History   Chief Complaint Chief Complaint  Patient presents with   Foot Pain   Head Laceration    HPI Brent Morales. is a 47 y.o. male.   HPI He is in today with a family member complaining of bilateral lower extremity edema.  He reports that he woke up this morning and this is how his legs looked. He reports that he has a history of swelling in his legs.  He is currently on hydrochlorothiazide 12.5 mg daily.  His primary care provider is in [place].  He reports that he is compliant with his medication.  He denies any shortness of breath, chest pain.  After further evaluation Brent Morales sleeping sitting up overnight and when he woke up he was having swelling in both feet along with a dark area to his forehead.  Past Medical History:  Diagnosis Date   Anxiety    Arthritis    Cervical stenosis of spinal canal    Depression    Fibromyalgia    Headache    Hernia    Bilateral Inguinal   Pancreatitis    Syncope    April 2016    Patient Active Problem List   Diagnosis Date Noted   Stroke (HCC) 07/12/2022   Acute respiratory failure (HCC) 07/12/2022   Encephalopathy acute 07/12/2022   Intractable nausea and vomiting 11/20/2020   Nausea & vomiting 11/19/2020   Mood disorder (HCC) 03/01/2014   Suicidal ideation    Pancreatitis, acute 03/01/2013   Abdominal pain 03/01/2013   Tobacco abuse disorder 08/29/2011   PERS HX NONCOMPLIANCE W/MED TX PRS HAZARDS HLTH 05/08/2009   TOBACCO USE 03/13/2009   LYME DISEASE, HX OF 11/02/2008   ABDOMINAL PAIN OTHER SPECIFIED SITE 10/11/2008   SPINAL STENOSIS, CERVICAL 05/02/2008   Anxiety state 11/19/2007   History of drug dependence (HCC) 04/10/2007   Depression 08/12/2006    Past Surgical History:  Procedure Laterality Date   INGUINAL HERNIA REPAIR     MULTIPLE EXTRACTIONS WITH ALVEOLOPLASTY N/A 10/13/2014   Procedure: Extraction of  tooth #'s 2,4,5,6,7,8,9,10,11,12,13,14,15, 40,98,11,91,47,82,95,62,13,08 with alveoloplasty and bilateral mandibular tori reductions;  Surgeon: Charlynne Pander, DDS;  Location: [facility] OR;  Service: Oral Surgery;  Laterality: N/A;  NASAL TUBE   thumb surgery         Home Medications    Prior to Admission medications   Medication Sig Start Date End Date Taking? Authorizing Provider  gabapentin (NEURONTIN) 300 MG capsule Take 2 capsules (600 mg total) by mouth 3 (three) times daily. 07/17/22 09/15/22 Yes Almon Hercules, MD  hydrOXYzine (ATARAX/VISTARIL) 25 MG tablet Take 25 mg by mouth at bedtime. 11/15/20  Yes [provider]  mirtazapine (REMERON SOL-TAB) 30 MG disintegrating tablet Take 30 mg by mouth at bedtime. 02/28/20  Yes [provider]  ondansetron (ZOFRAN) 4 MG tablet Take 1 tablet (4 mg total) by mouth every 4 (four) hours as needed for nausea. 11/22/20  Yes Gherghe, Daylene Katayama, MD  oxyCODONE (ROXICODONE) 15 MG immediate release tablet Take 15 mg by mouth 2 (two) times daily. 10/30/20  Yes [provider]  potassium chloride (KLOR-CON) 10 MEQ tablet Take 1 tablet (10 mEq total) by mouth daily. 08/22/22  Yes Barbette Merino, NP  QUEtiapine (SEROQUEL) 100 MG tablet Take 100 mg by mouth at bedtime. 05/16/22  Yes [provider]  torsemide (DEMADEX) 20  MG tablet Take 1 tablet (20 mg total) by mouth daily for 5 days. 08/22/22 08/27/22 Yes Barbette Merino, NP  omeprazole (PRILOSEC) 40 MG capsule Take 1 capsule (40 mg total) by mouth in the morning. About 30 minutes before breakfast 07/17/22 10/15/22  Almon Hercules, MD    Family History Family History  Problem Relation Age of Onset   Hypertension Mother    Heart disease Father    Crohn's disease Maternal Aunt    Colon cancer Paternal Grandfather     Social History Social History   Tobacco Use   Smoking status: Every Day    Packs/day: 3.50    Years: 22.00    Additional pack years: 0.00    Total pack years: 77.00     Types: Cigarettes    Last attempt to quit: 10/01/2014    Years since quitting: 7.8   Smokeless tobacco: Never   Tobacco comments:    uses a nicotine patch  Substance Use Topics   Alcohol use: No    Alcohol/week: 0.0 standard drinks of alcohol   Drug use: No     Allergies   Patient has no known allergies.   Review of Systems Review of Systems   Physical Exam Triage Vital Signs ED Triage Vitals  Enc Vitals Group     BP 08/22/22 1032 112/76     Pulse Rate 08/22/22 1032 78     Resp 08/22/22 1032 18     Temp 08/22/22 1032 98 F (36.7 C)     Temp Source 08/22/22 1032 Oral     SpO2 08/22/22 1032 94 %     Weight --      Height --      Head Circumference --      Peak Flow --      Pain Score 08/22/22 1035 10     Pain Loc --      Pain Edu? --      Excl. in GC? --    No data found.  Updated Vital Signs BP 112/76 (BP Location: Left Arm)   Pulse 78   Temp 98 F (36.7 C) (Oral)   Resp 18   SpO2 94%   Visual Acuity Right Eye Distance:   Left Eye Distance:   Bilateral Distance:    Right Eye Near:   Left Eye Near:    Bilateral Near:     Physical Exam HENT:     Head: Normocephalic.     Comments: Bruising to middle of forehead which is consistent with sitting up and sleeping with her head down on the table.    Nose: Nose normal.     Mouth/Throat:     Mouth: Mucous membranes are moist.  Eyes:     Extraocular Movements: Extraocular movements intact.     Pupils: Pupils are equal, round, and reactive to light.  Cardiovascular:     Rate and Rhythm: Normal rate and regular rhythm.     Pulses: Normal pulses.     Heart sounds: Normal heart sounds. No murmur heard. Pulmonary:     Effort: Pulmonary effort is normal.     Breath sounds: Normal breath sounds.  Musculoskeletal:     Cervical back: Normal range of motion.     Right lower leg: Edema (1+ leg -2+ pitting foot) present.     Left lower leg: Edema (1+ leg -2+ pitting foot) present.  Skin:    Capillary  Refill: Capillary refill takes less than 2 seconds.  Neurological:  General: No focal deficit present.     Mental Status: He is alert and oriented to person, place, and time.  Psychiatric:        Mood and Affect: Mood normal.      UC Treatments / Results  Labs (all labs ordered are listed, but only abnormal results are displayed) Labs Reviewed - No data to display  EKG   Radiology No results found.  Procedures Procedures (including critical care time)  Medications Ordered in UC Medications - No data to display  Initial Impression / Assessment and Plan / UC Course  I have reviewed the triage vital signs and the nursing notes.  Pertinent labs & imaging results that were available during my care of the patient were reviewed by me and considered in my medical decision making (see chart for details).     Swelling in legs Final Clinical Impressions(s) / UC Diagnoses   Final diagnoses:  Localized edema     Discharge Instructions      Brent Morales you have been diagnosed with localized edema bilateral lower extremities.  You have been started on furosemide 20 mg daily for the next 5 days along with a potassium supplement 10 mEq daily due to your recent history of hypokalemia.  The recommendation is for you to keep your legs elevated while sitting for long..  You are also encouraged to wear compression hose or socks as discussed.  The recommendation is for you not to sleep sitting up with your feet dangling overnight.  This causes bilateral swelling.  The recommendation is to consider smoking cessation due to the possible changes in your lower extremities.  Please see the attached for more information on edema.  Please continue all other medications as directed. You need to make sure that you follow-up with your primary care as scheduled and make her aware that she placed on a 5-day treatment of Torsemide for the edema.     ED Prescriptions     Medication Sig Dispense Auth.  Provider   torsemide (DEMADEX) 20 MG tablet Take 1 tablet (20 mg total) by mouth daily for 5 days. 5 tablet Thad Ranger M, NP   potassium chloride (KLOR-CON) 10 MEQ tablet Take 1 tablet (10 mEq total) by mouth daily. 5 tablet Barbette Merino, NP      PDMP not reviewed this encounter.   Thad Ranger [place], [place] 08/22/22 [phone]

## 2022-12-19 ENCOUNTER — Emergency Department (HOSPITAL_COMMUNITY): Payer: Medicare Other

## 2022-12-19 ENCOUNTER — Encounter (HOSPITAL_COMMUNITY): Payer: Self-pay

## 2022-12-19 ENCOUNTER — Observation Stay (HOSPITAL_COMMUNITY)
Admission: EM | Admit: 2022-12-19 | Discharge: 2022-12-19 | Discharge status: Against Medical Advice | Payer: Medicare Other | Attending: Internal Medicine | Admitting: Internal Medicine

## 2022-12-19 ENCOUNTER — Other Ambulatory Visit: Payer: Self-pay

## 2022-12-19 ENCOUNTER — Ambulatory Visit
Admission: EM | Admit: 2022-12-19 | Discharge: 2022-12-19 | Disposition: A | Discharge status: Other Acute Inpatient Hospital | Payer: Medicare Other | Attending: Internal Medicine | Admitting: Internal Medicine

## 2022-12-19 DIAGNOSIS — J90 Pleural effusion, not elsewhere classified: Secondary | ICD-10-CM | POA: Diagnosis present

## 2022-12-19 DIAGNOSIS — R000 Tachycardia, unspecified: Secondary | ICD-10-CM | POA: Diagnosis not present

## 2022-12-19 DIAGNOSIS — R0602 Shortness of breath: Secondary | ICD-10-CM | POA: Insufficient documentation

## 2022-12-19 DIAGNOSIS — I2699 Other pulmonary embolism without acute cor pulmonale: Principal | ICD-10-CM | POA: Diagnosis present

## 2022-12-19 DIAGNOSIS — R079 Chest pain, unspecified: Secondary | ICD-10-CM | POA: Diagnosis present

## 2022-12-19 DIAGNOSIS — Z1152 Encounter for screening for COVID-19: Secondary | ICD-10-CM | POA: Insufficient documentation

## 2022-12-19 DIAGNOSIS — F17200 Nicotine dependence, unspecified, uncomplicated: Secondary | ICD-10-CM | POA: Diagnosis not present

## 2022-12-19 DIAGNOSIS — F17210 Nicotine dependence, cigarettes, uncomplicated: Secondary | ICD-10-CM | POA: Insufficient documentation

## 2022-12-19 DIAGNOSIS — F39 Unspecified mood [affective] disorder: Secondary | ICD-10-CM | POA: Diagnosis not present

## 2022-12-19 LAB — BASIC METABOLIC PANEL
Anion gap: 10 (ref 5–15)
BUN: 8 mg/dL (ref 6–20)
CO2: 26 mmol/L (ref 22–32)
Calcium: 9 mg/dL (ref 8.9–10.3)
Chloride: 104 mmol/L (ref 98–111)
Creatinine, Ser: 0.77 mg/dL (ref 0.61–1.24)
GFR, Estimated: >60 mL/min (ref >60)
Glucose, Bld: 78 mg/dL (ref 70–99)
Potassium: 3.7 mmol/L (ref 3.5–5.1)
Sodium: 140 mmol/L (ref 135–145)

## 2022-12-19 LAB — CBC WITH DIFFERENTIAL/PLATELET
Abs Immature Granulocytes: 0.02 10*3/uL (ref 0.00–0.07)
Basophils Absolute: 0 10*3/uL (ref 0.0–0.1)
Basophils Relative: 0 %
Eosinophils Absolute: 0.3 10*3/uL (ref 0.0–0.5)
Eosinophils Relative: 3 %
HCT: 44.6 % (ref 39.0–52.0)
Hemoglobin: 14.1 g/dL (ref 13.0–17.0)
Immature Granulocytes: 0 %
Lymphocytes Relative: 18 %
Lymphs Abs: 1.7 10*3/uL (ref 0.7–4.0)
MCH: 27.6 pg (ref 26.0–34.0)
MCHC: 31.6 g/dL (ref 30.0–36.0)
MCV: 87.3 fL (ref 80.0–100.0)
Monocytes Absolute: 1.7 10*3/uL — ABNORMAL HIGH (ref 0.1–1.0)
Monocytes Relative: 19 %
Neutro Abs: 5.6 10*3/uL (ref 1.7–7.7)
Neutrophils Relative %: 60 %
Platelets: 284 10*3/uL (ref 150–400)
RBC: 5.11 MIL/uL (ref 4.22–5.81)
RDW: 14.5 % (ref 11.5–15.5)
WBC: 9.3 10*3/uL (ref 4.0–10.5)
nRBC: 0 % (ref 0.0–0.2)

## 2022-12-19 LAB — URINALYSIS, COMPLETE (UACMP) WITH MICROSCOPIC
Bacteria, UA: NONE SEEN
Bilirubin Urine: NEGATIVE
Glucose, UA: 50 mg/dL — AB
Hgb urine dipstick: NEGATIVE
Ketones, ur: NEGATIVE mg/dL
Leukocytes,Ua: NEGATIVE
Nitrite: NEGATIVE
Protein, ur: NEGATIVE mg/dL
Specific Gravity, Urine: >1.046 — ABNORMAL HIGH (ref 1.005–1.030)
pH: 7 (ref 5.0–8.0)

## 2022-12-19 LAB — I-STAT CHEM 8, ED
BUN: 8 mg/dL (ref 6–20)
Calcium, Ion: 1.15 mmol/L (ref 1.15–1.40)
Chloride: 104 mmol/L (ref 98–111)
Creatinine, Ser: 0.8 mg/dL (ref 0.61–1.24)
Glucose, Bld: 82 mg/dL (ref 70–99)
HCT: 45 % (ref 39.0–52.0)
Hemoglobin: 15.3 g/dL (ref 13.0–17.0)
Potassium: 3.6 mmol/L (ref 3.5–5.1)
Sodium: 140 mmol/L (ref 135–145)
TCO2: 27 mmol/L (ref 22–32)

## 2022-12-19 LAB — HEPATIC FUNCTION PANEL
ALT: 11 U/L (ref 0–44)
AST: 19 U/L (ref 15–41)
Albumin: 2.5 g/dL — ABNORMAL LOW (ref 3.5–5.0)
Alkaline Phosphatase: 66 U/L (ref 38–126)
Bilirubin, Direct: 0.2 mg/dL (ref 0.0–0.2)
Indirect Bilirubin: 0.7 mg/dL (ref 0.3–0.9)
Total Bilirubin: 0.9 mg/dL (ref 0.3–1.2)
Total Protein: 5.5 g/dL — ABNORMAL LOW (ref 6.5–8.1)

## 2022-12-19 LAB — MAGNESIUM: Magnesium: 1.9 mg/dL (ref 1.7–2.4)

## 2022-12-19 LAB — RAPID URINE DRUG SCREEN, HOSP PERFORMED
Amphetamines: NOT DETECTED
Barbiturates: NOT DETECTED
Benzodiazepines: POSITIVE — AB
Cocaine: NOT DETECTED
Opiates: POSITIVE — AB
Tetrahydrocannabinol: NOT DETECTED

## 2022-12-19 LAB — TROPONIN I (HIGH SENSITIVITY)
Troponin I (High Sensitivity): 6 ng/L (ref <18)
Troponin I (High Sensitivity): 5 ng/L (ref <18)

## 2022-12-19 LAB — SARS CORONAVIRUS 2 BY RT PCR: SARS Coronavirus 2 by RT PCR: NEGATIVE

## 2022-12-19 LAB — PHOSPHORUS: Phosphorus: 3.3 mg/dL (ref 2.5–4.6)

## 2022-12-19 LAB — CK: Total CK: 68 U/L (ref 49–397)

## 2022-12-19 LAB — BRAIN NATRIURETIC PEPTIDE: B Natriuretic Peptide: 162.4 pg/mL — ABNORMAL HIGH (ref 0.0–100.0)

## 2022-12-19 LAB — TSH: TSH: 0.569 u[IU]/mL (ref 0.350–4.500)

## 2022-12-19 MED ORDER — NICOTINE 21 MG/24HR TD PT24
21.0000 mg | MEDICATED_PATCH | Freq: Every day | TRANSDERMAL | Status: DC
  Administered 2022-12-19: 21 mg via TRANSDERMAL
  Filled 2022-12-19: qty 1

## 2022-12-19 MED ORDER — ASPIRIN 81 MG PO CHEW
324.0000 mg | CHEWABLE_TABLET | Freq: Once | ORAL | Status: AC
  Administered 2022-12-19: 324 mg via ORAL

## 2022-12-19 MED ORDER — HEPARIN BOLUS VIA INFUSION
5000.0000 [IU] | Freq: Once | INTRAVENOUS | Status: AC
  Administered 2022-12-19: 5000 [IU] via INTRAVENOUS
  Filled 2022-12-19: qty 5000

## 2022-12-19 MED ORDER — ALBUTEROL SULFATE (2.5 MG/3ML) 0.083% IN NEBU: 2.5000 mg | INHALATION_SOLUTION | RESPIRATORY_TRACT | Status: DC | PRN

## 2022-12-19 MED ORDER — FENTANYL CITRATE PF 50 MCG/ML IJ SOSY
50.0000 ug | PREFILLED_SYRINGE | Freq: Once | INTRAMUSCULAR | Status: AC
  Administered 2022-12-19: 50 ug via INTRAVENOUS
  Filled 2022-12-19: qty 1

## 2022-12-19 MED ORDER — IPRATROPIUM-ALBUTEROL 0.5-2.5 (3) MG/3ML IN SOLN
3.0000 mL | Freq: Four times a day (QID) | RESPIRATORY_TRACT | Status: DC
  Administered 2022-12-19: 3 mL via RESPIRATORY_TRACT
  Filled 2022-12-19: qty 3

## 2022-12-19 MED ORDER — IOHEXOL 350 MG/ML SOLN
75.0000 mL | Freq: Once | INTRAVENOUS | Status: AC | PRN
  Administered 2022-12-19: 75 mL via INTRAVENOUS

## 2022-12-19 MED ORDER — HEPARIN (PORCINE) 25000 UT/250ML-% IV SOLN
1350.0000 [IU]/h | INTRAVENOUS | Status: DC
  Administered 2022-12-19: 1350 [IU]/h via INTRAVENOUS
  Filled 2022-12-19: qty 250

## 2022-12-19 NOTE — ED Triage Notes (Signed)
Pt bib ems form urgent care; sent for further evaluation of cp x 3 days; L sided under L pec; no N/V/D; denies injury; every day smoker; lower lobes decreased with ems; 89-90% RA initially with ems; pt reports hx of pneumonia, states feels the same; has not felt well x 4 days; denies fevers, denies cough; 2L with ems, sats upper 90s; 112/60, HR 74, 96% 2L, RR 14; 12 lead unremarkable with ems

## 2022-12-19 NOTE — ED Provider Notes (Signed)
[place] EMERGENCY DEPARTMENT AT [facility] Provider Note   CSN: [id number] Arrival date & time: 12/19/22  1431     History  Chief Complaint  Patient presents with   Chest Pain    Brent Libman Schweitzer Montez Hageman. is a 47 y.o. male.  With a past medical history of heavy tobacco abuse (1.5 ppd), bipolar disorder.  Patient reports 3 days of 10 out of 10 chest pain worse on the left, worse with deep breathing, associated shortness of breath.  Sent in from urgent care for further evaluation   Chest Pain      Home Medications Prior to Admission medications   Medication Sig Start Date End Date Taking? Authorizing Provider  losartan (COZAAR) 100 MG tablet Take 1 tablet by mouth daily. 11/27/22  Yes [provider]  losartan (COZAAR) 50 MG tablet Take 50 mg by mouth daily. 11/08/22  Yes [provider]  sertraline (ZOLOFT) 50 MG tablet Take 50 mg by mouth daily. 10/10/22  Yes [provider]  gabapentin (NEURONTIN) 300 MG capsule Take 2 capsules (600 mg total) by mouth 3 (three) times daily. 07/17/22 12/19/22  Almon Hercules, MD  hydrochlorothiazide (HYDRODIURIL) 25 MG tablet Take 25 mg by mouth daily.    [provider]  hydrOXYzine (ATARAX/VISTARIL) 25 MG tablet Take 25 mg by mouth at bedtime. 11/15/20   [provider]  mirtazapine (REMERON SOL-TAB) 30 MG disintegrating tablet Take 30 mg by mouth at bedtime. 02/28/20   [provider]  omeprazole (PRILOSEC) 40 MG capsule Take 1 capsule (40 mg total) by mouth in the morning. About 30 minutes before breakfast 07/17/22 10/15/22  Almon Hercules, MD  ondansetron (ZOFRAN) 4 MG tablet Take 1 tablet (4 mg total) by mouth every 4 (four) hours as needed for nausea. 11/22/20   Leatha Gilding, MD  oxyCODONE (ROXICODONE) 15 MG immediate release tablet Take 15 mg by mouth 2 (two) times daily. 10/30/20   [provider]  potassium chloride (KLOR-CON) 10 MEQ tablet Take 1 tablet (10 mEq total) by mouth  daily. 08/22/22   Barbette Merino, NP  QUEtiapine (SEROQUEL) 100 MG tablet Take 100 mg by mouth at bedtime. 05/16/22   [provider]  testosterone cypionate (DEPOTESTOSTERONE CYPIONATE) 200 MG/ML injection Inject 100 mg into the muscle once a week.    [provider]  torsemide (DEMADEX) 20 MG tablet Take 1 tablet (20 mg total) by mouth daily for 5 days. 08/22/22 08/27/22  Barbette Merino, NP      Allergies    Patient has no known allergies.    Review of Systems   Review of Systems  Cardiovascular:  Positive for chest pain.    Physical Exam Updated Vital Signs BP 102/62   Pulse (!) 56   Temp 98.3 F (36.8 C) (Oral)   Resp 14   SpO2 97%  Physical Exam Vitals and nursing note reviewed.  Constitutional:      General: He is not in acute distress.    Appearance: He is well-developed. He is not diaphoretic.     Comments: Overwhelming smell of cigarettes in the room  HENT:     Head: Normocephalic and atraumatic.  Eyes:     General: No scleral icterus.    Conjunctiva/sclera: Conjunctivae normal.  Cardiovascular:     Rate and Rhythm: Normal rate and regular rhythm.     Heart sounds: Normal heart sounds.  Pulmonary:     Effort: Pulmonary effort is normal. No  respiratory distress.     Breath sounds: Normal breath sounds.  Abdominal:     Palpations: Abdomen is soft.     Tenderness: There is no abdominal tenderness.  Musculoskeletal:     Cervical back: Normal range of motion and neck supple.  Skin:    General: Skin is warm and dry.  Neurological:     Mental Status: He is alert.  Psychiatric:        Mood and Affect: Affect is flat.        Behavior: Behavior normal.     ED Results / Procedures / Treatments   Labs (all labs ordered are listed, but only abnormal results are displayed) Labs Reviewed  CBC WITH DIFFERENTIAL/PLATELET - Abnormal; Notable for the following components:      Result Value   Monocytes Absolute 1.7 (*)    All other components within  normal limits  BRAIN NATRIURETIC PEPTIDE - Abnormal; Notable for the following components:   B Natriuretic Peptide 162.4 (*)    All other components within normal limits  BASIC METABOLIC PANEL  I-STAT CHEM 8, ED  TROPONIN I (HIGH SENSITIVITY)  TROPONIN I (HIGH SENSITIVITY)    EKG None  Radiology CT Angio Chest PE W and/or Wo Contrast  Result Date: 12/19/2022 CLINICAL DATA:  Pulmonary embolism (PE) suspected, high prob. Chest pain for 3 days. Difficulty breathing. EXAM: CT ANGIOGRAPHY CHEST WITH CONTRAST TECHNIQUE: Multidetector CT imaging of the chest was performed using the standard protocol during bolus administration of intravenous contrast. Multiplanar CT image reconstructions and MIPs were obtained to evaluate the vascular anatomy. RADIATION DOSE REDUCTION: This exam was performed according to the departmental dose-optimization program which includes automated exposure control, adjustment of the mA and/or kV according to patient size and/or use of iterative reconstruction technique. CONTRAST:  75mL OMNIPAQUE IOHEXOL 350 MG/ML SOLN COMPARISON:  CT scan chest from 07/12/2022. FINDINGS: Cardiovascular: There is small volume pulmonary embolism in the left lung. There is occlusive thrombus in the segmental pulmonary artery branch to inferior lingula. There is small volume nonocclusive thrombus in the left lung lower lobe lobar pulmonary artery branches with extension into the segmental pulmonary artery. No right heart strain. There is resultant lung infarction in the inferior lingula. Normal cardiac size. No pericardial effusion. No aortic aneurysm. Mediastinum/Nodes: Visualized thyroid gland appears grossly unremarkable. No solid / cystic mediastinal masses. The esophagus is nondistended precluding optimal assessment. There is mild circumferential thickening of the lower thoracic esophagus, which is most likely seen in the settings of chronic gastroesophageal reflux disease versus esophagitis.  There are multiple enlarged mediastinal and bilateral hilar lymph nodes, increased since the prior study. These are indeterminate in etiology but favored benign/reactive in the given clinical settings. No axillary lymphadenopathy by size criteria. Lungs/Pleura: The central tracheo-bronchial tree is patent. There is small-to-moderate left pleural effusion with associated compressive atelectatic changes in the left lung lower lobe. There is a lung infarction in the inferior lingula. There are patchy areas of atelectasis in the right lung lower lobe. No right pleural effusion. No mass or consolidation or pneumothorax. No suspicious lung nodule. Upper Abdomen: Visualized upper abdominal viscera within normal limits. Musculoskeletal: Bilateral small-to-moderate symmetric gynecomastia noted. The visualized soft tissues of the chest wall are grossly unremarkable. No suspicious osseous lesions. Subacute/healing right third through sixth rib fractures noted. Review of the MIP images confirms the above findings. IMPRESSION: 1. Small volume pulmonary embolism in the left lung with resultant lung infarction in the inferior lingula. No right heart strain. 2.  Small-to-moderate left pleural effusion with associated compressive atelectatic changes in the left lung lower lobe. 3. Multiple enlarged mediastinal and bilateral hilar lymph nodes, increased since the prior study. These are indeterminate in etiology but favored benign/reactive in the given clinical settings. 4. Mild circumferential thickening of the lower thoracic esophagus, which is most likely seen in the settings of chronic gastroesophageal reflux disease versus esophagitis. 5. Multiple other nonacute observations, as described above. Critical Value/emergent results were called by telephone at the time of interpretation on 12/19/2022 at 5:01 pm to Dr. Rubin Payor, who verbally acknowledged these results. Electronically Signed   By: Jules Schick M.D.   On: 12/19/2022  17:05   DG Chest Portable 1 View  Result Date: 12/19/2022 CLINICAL DATA:  Chest pain for 3 days.  Difficulty breathing. EXAM: PORTABLE CHEST 1 VIEW COMPARISON:  07/12/2022. FINDINGS: There is left retrocardiac airspace opacity obscuring the left hemidiaphragm, descending thoracic aorta and blunting the left lateral costophrenic angle suggesting combination of left lower lobe atelectasis and/or consolidation with pleural effusion. Bilateral lung fields are clear. Right lateral costophrenic angle is clear. Evaluation of cardiomediastinal silhouette is nondiagnostic due to left lower hemithorax opacification. No acute osseous abnormalities. The soft tissues are within normal limits. IMPRESSION: *Left lower lobe atelectasis and/or consolidation with associated left pleural effusion. Electronically Signed   By: Jules Schick M.D.   On: 12/19/2022 16:51    Procedures .Critical Care  Performed by: Arthor Captain, PA-C Authorized by: Arthor Captain, PA-C   Critical care provider statement:    Critical care time (minutes):  60   Critical care time was exclusive of:  Separately billable procedures and treating other patients   Critical care was necessary to treat or prevent imminent or life-threatening deterioration of the following conditions:  Respiratory failure   Critical care was time spent personally by me on the following activities:  Development of treatment plan with patient or surrogate, discussions with consultants, evaluation of patient's response to treatment, examination of patient, ordering and review of laboratory studies, ordering and review of radiographic studies, ordering and performing treatments and interventions, pulse oximetry, re-evaluation of patient's condition and review of old charts     Medications Ordered in ED Medications  nicotine (NICODERM CQ - dosed in mg/24 hours) patch 21 mg (has no administration in time range)  iohexol (OMNIPAQUE) 350 MG/ML injection 75 mL (75  mLs Intravenous Contrast Given 12/19/22 1636)    ED Course/ Medical Decision Making/ A&P Clinical Course as of 12/19/22 1728  Thu Dec 19, 2022  1727 CT Angio Chest PE W and/or Wo Contrast I visualized and interpreted CT angio of the chest which shows pulmonary embolus in the left lingula with associated infarction and effusion. [AH]    Clinical Course User Index [AH] Arthor Captain, PA-C                                 Medical Decision Making Amount and/or Complexity of Data Reviewed Radiology:  Decision-making details documented in ED Course.  Risk Prescription drug management.   This patient presents to the ED for concern of cp, this involves an extensive number of treatment options, and is a complaint that carries with it a high risk of complications and morbidity.  The emergent differential diagnosis of chest pain includes: Acute coronary syndrome, pericarditis, aortic dissection, pulmonary embolism, tension pneumothorax, pneumonia, and esophageal rupture.   Co morbidities:   has a past medical history  of Anxiety, Arthritis, Cervical stenosis of spinal canal, Depression, Fibromyalgia, Headache, Hernia, Pancreatitis, and Syncope.   Social Determinants of Health:   SDOH Screenings   Food Insecurity: No Food Insecurity (12/19/2022)  Housing: Low Risk  (12/19/2022)  Transportation Needs: No Transportation Needs (12/19/2022)  Utilities: Not At Risk (12/19/2022)  Financial Resource Strain: Medium Risk (07/04/2022)   Received from [facility], [facility]  Social Connections: Unknown (07/30/2022)   Received from [facility], [facility]  Tobacco Use: High Risk (12/19/2022)  Health Literacy: Low Risk  (04/01/2022)   Received from [facility], [facility]     Additional history:  {Additional history obtained from patient {External records from outside source obtained and reviewed including psych notes  Lab Tests:  I Ordered, and personally  interpreted labs.  The pertinent results include:   Urine drug screen positive for opiates and benzodiazepines. COVID test negative. Urine without evidence of infection BNP just above normal insignificant Remainder of labs unremarkable   Imaging Studies:  I ordered imaging studies including CT angiogram of the chest I independently visualized and interpreted imaging which showed left lingular pulmonary embolism with infarction of the lung and small pleural effusion I agree with the radiologist interpretation  Cardiac Monitoring/ECG:  The patient was maintained on a cardiac monitor.  I personally viewed and interpreted the cardiac monitored which showed an underlying rhythm of: Sinus rhythm  Medicines ordered and prescription drug management:  I ordered medication including  Medications  iohexol (OMNIPAQUE) 350 MG/ML injection 75 mL (75 mLs Intravenous Contrast Given 12/19/22 1636)  fentaNYL (SUBLIMAZE) injection 50 mcg (50 mcg Intravenous Given 12/19/22 1738)  heparin bolus via infusion 5,000 Units (5,000 Units Intravenous Bolus from Bag 12/19/22 1802)   for acute pulmonary embolus with lung infarction, pain and nicotine patch for nicotine dependence Reevaluation of the patient after these medicines showed that the patient improved I have reviewed the patients home medicines and have made adjustments as needed  Test Considered:    Critical Interventions:   IV heparin  Consultations Obtained: Dr. Adela Glimpse for admission  Problem List / ED Course:     ICD-10-CM   1. Acute pulmonary embolism without acute cor pulmonale, unspecified pulmonary embolism type (HCC)  I26.99     2. Infarction of lung due to iatrogenic pulmonary embolism (HCC)  I26.99     3. Pleural effusion  J90       MDM: Patient here with acute pulmonary embolus with lung infarction, IV heparin started.  Patient admitted to the hospitalist service.  No evidence of right heart  strain.   Dispostion:  After consideration of the diagnostic results and the patients response to treatment, I feel that the patent would benefit from admission        Final Clinical Impression(s) / ED Diagnoses Final diagnoses:  None    Rx / DC Orders ED Discharge Orders     None         Arthor Captain, PA-C 12/20/22 1403    Benjiman Core, MD 12/23/22 1448

## 2022-12-19 NOTE — Assessment & Plan Note (Signed)
Possibly reactive continue to monitor Currently no O2 requirement

## 2022-12-19 NOTE — ED Provider Notes (Signed)
[facility]    CSN: [id number] Arrival date & time: 12/19/22  1322      History   Chief Complaint Chief Complaint  Patient presents with   Chest Pain    HPI Brent Heffern. is a 47 y.o. male.   Patient presents with chest pain that started 3 days ago but is worsened today.  Reports that it started in the left side of his chest and is now radiating to the right.  He also reports some shortness of breath.  Patient rates pain 10/10 on pain scale, states it is constant, and describes it as a "pressure".  Denies headache, dizziness, blurred vision, nausea, vomiting.  Patient denies any history of cardiac or pulmonary problems.  He has not taken any medication for pain.   Chest Pain   Past Medical History:  Diagnosis Date   Anxiety    Arthritis    Cervical stenosis of spinal canal    Depression    Fibromyalgia    Headache    Hernia    Bilateral Inguinal   Pancreatitis    Syncope    April 2016    Patient Active Problem List   Diagnosis Date Noted   Stroke (HCC) 07/12/2022   Acute respiratory failure (HCC) 07/12/2022   Encephalopathy acute 07/12/2022   Intractable nausea and vomiting 11/20/2020   Nausea & vomiting 11/19/2020   Mood disorder (HCC) 03/01/2014   Suicidal ideation    Pancreatitis, acute 03/01/2013   Abdominal pain 03/01/2013   Tobacco abuse disorder 08/29/2011   PERS HX NONCOMPLIANCE W/MED TX PRS HAZARDS HLTH 05/08/2009   TOBACCO USE 03/13/2009   LYME DISEASE, HX OF 11/02/2008   ABDOMINAL PAIN OTHER SPECIFIED SITE 10/11/2008   SPINAL STENOSIS, CERVICAL 05/02/2008   Anxiety state 11/19/2007   History of drug dependence (HCC) 04/10/2007   Depression 08/12/2006    Past Surgical History:  Procedure Laterality Date   INGUINAL HERNIA REPAIR     MULTIPLE EXTRACTIONS WITH ALVEOLOPLASTY N/A 10/13/2014   Procedure: Extraction of tooth #'s 2,4,5,6,7,8,9,10,11,12,13,14,15, 96,29,52,84,13,24,40,10,27,25 with alveoloplasty and bilateral  mandibular tori reductions;  Surgeon: Charlynne Pander, DDS;  Location: [facility] OR;  Service: Oral Surgery;  Laterality: N/A;  NASAL TUBE   thumb surgery         Home Medications    Prior to Admission medications   Medication Sig Start Date End Date Taking? Authorizing Provider  gabapentin (NEURONTIN) 300 MG capsule Take 2 capsules (600 mg total) by mouth 3 (three) times daily. 07/17/22 12/19/22 Yes Almon Hercules, MD  hydrOXYzine (ATARAX/VISTARIL) 25 MG tablet Take 25 mg by mouth at bedtime. 11/15/20  Yes [provider]  mirtazapine (REMERON SOL-TAB) 30 MG disintegrating tablet Take 30 mg by mouth at bedtime. 02/28/20  Yes [provider]  ondansetron (ZOFRAN) 4 MG tablet Take 1 tablet (4 mg total) by mouth every 4 (four) hours as needed for nausea. 11/22/20  Yes Gherghe, Daylene Katayama, MD  oxyCODONE (ROXICODONE) 15 MG immediate release tablet Take 15 mg by mouth 2 (two) times daily. 10/30/20  Yes [provider]  potassium chloride (KLOR-CON) 10 MEQ tablet Take 1 tablet (10 mEq total) by mouth daily. 08/22/22  Yes Barbette Merino, NP  QUEtiapine (SEROQUEL) 100 MG tablet Take 100 mg by mouth at bedtime. 05/16/22  Yes [provider]  omeprazole (PRILOSEC) 40 MG capsule Take 1 capsule (40 mg total) by mouth in the morning. About 30 minutes before breakfast 07/17/22 10/15/22  Almon Hercules,  MD  torsemide (DEMADEX) 20 MG tablet Take 1 tablet (20 mg total) by mouth daily for 5 days. 08/22/22 08/27/22  Barbette Merino, NP    Family History Family History  Problem Relation Age of Onset   Hypertension Mother    Heart disease Father    Crohn's disease Maternal Aunt    Colon cancer Paternal Grandfather     Social History Social History   Tobacco Use   Smoking status: Every Day    Current packs/day: 0.00    Average packs/day: 3.5 packs/day for 22.0 years (77.0 ttl pk-yrs)    Types: Cigarettes    Start date: 09/30/1992    Last attempt to quit: 10/01/2014    Years since  quitting: 8.2   Smokeless tobacco: Never   Tobacco comments:    uses a nicotine patch  Substance Use Topics   Alcohol use: No    Alcohol/week: 0.0 standard drinks of alcohol   Drug use: No     Allergies   Patient has no known allergies.   Review of Systems Review of Systems Per HPI  Physical Exam Triage Vital Signs ED Triage Vitals  Encounter Vitals Group     BP 12/19/22 1334 (!) 158/97     Systolic BP Percentile --      Diastolic BP Percentile --      Pulse Rate 12/19/22 1334 98     Resp 12/19/22 1334 18     Temp 12/19/22 1334 97.7 F (36.5 C)     Temp Source 12/19/22 1334 Oral     SpO2 12/19/22 1334 93 %     Weight --      Height --      Head Circumference --      Peak Flow --      Pain Score 12/19/22 1337 10     Pain Loc --      Pain Education --      Exclude from Growth Chart --    No data found.  Updated Vital Signs BP (!) 158/97 (BP Location: Left Arm)   Pulse 98   Temp 97.7 F (36.5 C) (Oral)   Resp 18   SpO2 93%   Visual Acuity Right Eye Distance:   Left Eye Distance:   Bilateral Distance:    Right Eye Near:   Left Eye Near:    Bilateral Near:     Physical Exam Constitutional:      General: He is not in acute distress.    Appearance: Normal appearance. He is not toxic-appearing or diaphoretic.  HENT:     Head: Normocephalic and atraumatic.  Eyes:     Extraocular Movements: Extraocular movements intact.     Conjunctiva/sclera: Conjunctivae normal.  Cardiovascular:     Rate and Rhythm: Normal rate and regular rhythm.     Pulses: Normal pulses.     Heart sounds: Normal heart sounds.  Pulmonary:     Effort: Pulmonary effort is normal. No respiratory distress.     Breath sounds: Normal breath sounds.  Neurological:     General: No focal deficit present.     Mental Status: He is alert and oriented to person, place, and time. Mental status is at baseline.  Psychiatric:        Mood and Affect: Mood normal.        Behavior: Behavior  normal.        Thought Content: Thought content normal.        Judgment: Judgment normal.  UC Treatments / Results  Labs (all labs ordered are listed, but only abnormal results are displayed) Labs Reviewed - No data to display  EKG   Radiology No results found.  Procedures Procedures (including critical care time)  Medications Ordered in UC Medications  aspirin chewable tablet 324 mg (324 mg Oral Given 12/19/22 1343)    Initial Impression / Assessment and Plan / UC Course  I have reviewed the triage vital signs and the nursing notes.  Pertinent labs & imaging results that were available during my care of the patient were reviewed by me and considered in my medical decision making (see chart for details).     EKG unremarkable.  Advised patient that he will need to go to the ER for further evaluation and management given limited resources here in urgent care.  He was agreeable with plan.  Patient left via EMS transport. Final Clinical Impressions(s) / UC Diagnoses   Final diagnoses:  Chest pain, unspecified type   Discharge Instructions   None    ED Prescriptions   None    PDMP not reviewed this encounter.   Gustavus Bryant, [place] 12/19/22 1410

## 2022-12-19 NOTE — ED Triage Notes (Signed)
Pt presents to the office for chest pain x 3 days. Pt states pain 10/10. Pt denies any vomiting or diarrhea. Pt denies any falls or trauma. Pt reports he is a every day smoker. Provider called to the room to assess.

## 2022-12-19 NOTE — ED Notes (Signed)
This writer unable to obtain IV access dure to poor venous status. EMS with Pt and will start IV.

## 2022-12-19 NOTE — ED Provider Triage Note (Signed)
Emergency Medicine Provider Triage Evaluation Note  [name] , a 47 y.o. male  was evaluated in triage.  Pt complains of dyspnea, chest pain.  Onset of symptoms over the last 3 days.  Pain described as pleuritic, left-sided chest pain.  Difficulty with deep inspiration.  Exertional dyspnea.  No history of DVT or PE.  No recent travel.  No leg swelling.  Concern for possible pneumonia.  Still smokes cigarettes fevers or sick contacts  Review of Systems  Positive: Dyspnea chest pain Negative: Fever  Physical Exam  BP 126/70 (BP Location: Right Arm)   Pulse 78   Temp 98.3 F (36.8 C) (Oral)   Resp 20   SpO2 93% Comment: room air Gen:   Awake Resp:  Breath sounds diminished bilateral MSK:   Moves extremities without difficulty  Other:  No lower extremity edema, no [facility]  Medical Decision Making  Medically screening exam initiated at 3:00 PM.  Appropriate orders placed.  [name] Silfies Jr. was informed that the remainder of the evaluation will be completed by another provider, this initial triage assessment does not replace that evaluation, and the importance of remaining in the ED until their evaluation is complete.  Patient hypoxia in room air, 88%.  Placed on 2 L nasal cannula.  No home oxygen requirement typically.  Vital signs stable otherwise.  Screening labs/imaging ordered.   Tanda Rockers A, DO 12/19/22 1506

## 2022-12-19 NOTE — ED Notes (Signed)
ED TO INPATIENT HANDOFF REPORT  ED Nurse Name and Phone #: Joneen Boers, Paramedic / [phone]  S Name/Age/Gender Brent Morales. 47 y.o. male Room/Bed: 011C/011C  Code Status   Code Status: Full Code  Home/SNF/Other Home Patient oriented to: self, place, time, and situation Is this baseline? Yes   Triage Complete: Triage complete  Chief Complaint Pulmonary embolism without acute cor pulmonale (HCC) [I26.99]  Triage Note Pt bib ems form urgent care; sent for further evaluation of cp x 3 days; L sided under L pec; no N/V/D; denies injury; every day smoker; lower lobes decreased with ems; 89-90% RA initially with ems; pt reports hx of pneumonia, states feels the same; has not felt well x 4 days; denies fevers, denies cough; 2L with ems, sats upper 90s; 112/60, HR 74, 96% 2L, RR 14; 12 lead unremarkable with ems   Allergies No Known Allergies  Level of Care/Admitting Diagnosis ED Disposition     ED Disposition  Admit   Condition  --   Comment  Hospital Area: [facility] [[id number]]  Level of Care: Telemetry Medical [[id number]]  May place patient in observation at [facility] or [facility] if equivalent level of care is available:: No  Covid Evaluation: Asymptomatic - no recent exposure (last 10 days) testing not required  Diagnosis: Pulmonary embolism without acute cor pulmonale [facility]) [[id number]]  Admitting Physician: Therisa Doyne [[id number]]  Attending Physician: Therisa Doyne [[id number]]          B Medical/Surgery History Past Medical History:  Diagnosis Date   Anxiety    Arthritis    Cervical stenosis of spinal canal    Depression    Fibromyalgia    Headache    Hernia    Bilateral Inguinal   Pancreatitis    Syncope    April 2016   Past Surgical History:  Procedure Laterality Date   INGUINAL HERNIA REPAIR     MULTIPLE EXTRACTIONS WITH ALVEOLOPLASTY N/A 10/13/2014   Procedure: Extraction of tooth #'s 2,4,5,6,7,8,9,10,11,12,13,14,15,  13,24,40,10,27,25,36,64,40,34 with alveoloplasty and bilateral mandibular tori reductions;  Surgeon: Charlynne Pander, DDS;  Location: [facility] OR;  Service: Oral Surgery;  Laterality: N/A;  NASAL TUBE   thumb surgery       A IV Location/Drains/Wounds Patient Lines/Drains/Airways Status     Active Line/Drains/Airways     Name Placement date Placement time Site Days   Peripheral IV 12/19/22 20 G Anterior;Right Forearm 12/19/22  1509  Forearm  less than 1            Intake/Output Last 24 hours No intake or output data in the 24 hours ending 12/19/22 1904  Labs/Imaging Results for orders placed or performed during the hospital encounter of 12/19/22 (from the past 48 hour(s))  Basic metabolic panel     Status: None   Collection Time: 12/19/22  3:09 PM  Result Value Ref Range   Sodium 140 135 - 145 mmol/L   Potassium 3.7 3.5 - 5.1 mmol/L   Chloride 104 98 - 111 mmol/L   CO2 26 22 - 32 mmol/L   Glucose, Bld 78 70 - 99 mg/dL    Comment: Glucose reference range applies only to samples taken after fasting for at least 8 hours.   BUN 8 6 - 20 mg/dL   Creatinine, Ser 7.42 0.61 - 1.24 mg/dL   Calcium 9.0 8.9 - 59.5 mg/dL   GFR, Estimated >63 >87 mL/min    Comment: (NOTE) Calculated using the CKD-EPI Creatinine Equation (2021)  Anion gap 10 5 - 15    Comment: Performed at [facility], [address], [place], [place] [address]  CBC with Differential     Status: Abnormal   Collection Time: 12/19/22  3:09 PM  Result Value Ref Range   WBC 9.3 4.0 - 10.5 K/uL   RBC 5.11 4.22 - 5.81 MIL/uL   Hemoglobin 14.1 13.0 - 17.0 g/dL   HCT 91.4 78.2 - 95.6 %   MCV 87.3 80.0 - 100.0 fL   MCH 27.6 26.0 - 34.0 pg   MCHC 31.6 30.0 - 36.0 g/dL   RDW 21.3 08.6 - 57.8 %   Platelets 284 150 - 400 K/uL   nRBC 0.0 0.0 - 0.2 %   Neutrophils Relative % 60 %   Neutro Abs 5.6 1.7 - 7.7 K/uL   Lymphocytes Relative 18 %   Lymphs Abs 1.7 0.7 - 4.0 K/uL   Monocytes Relative 19 %   Monocytes  Absolute 1.7 (H) 0.1 - 1.0 K/uL   Eosinophils Relative 3 %   Eosinophils Absolute 0.3 0.0 - 0.5 K/uL   Basophils Relative 0 %   Basophils Absolute 0.0 0.0 - 0.1 K/uL   Immature Granulocytes 0 %   Abs Immature Granulocytes 0.02 0.00 - 0.07 K/uL    Comment: Performed at [facility], [address], [place], [place] [address]  Brain natriuretic peptide     Status: Abnormal   Collection Time: 12/19/22  3:09 PM  Result Value Ref Range   B Natriuretic Peptide 162.4 (H) 0.0 - 100.0 pg/mL    Comment: Performed at [facility], [address], [place], [place] [address]  Troponin I (High Sensitivity)     Status: None   Collection Time: 12/19/22  3:09 PM  Result Value Ref Range   Troponin I (High Sensitivity) 6 <18 ng/L    Comment: (NOTE) Elevated high sensitivity troponin I (hsTnI) values and significant  changes across serial measurements may suggest ACS but many other  chronic and acute conditions are known to elevate hsTnI results.  Refer to the "Links" section for chest pain algorithms and additional  guidance. Performed at [facility], [address] [place], [place] [address]   I-stat chem 8, ED (not at [facility], [facility] or [facility])     Status: None   Collection Time: 12/19/22  3:40 PM  Result Value Ref Range   Sodium 140 135 - 145 mmol/L   Potassium 3.6 3.5 - 5.1 mmol/L   Chloride 104 98 - 111 mmol/L   BUN 8 6 - 20 mg/dL   Creatinine, Ser 0.10 0.61 - 1.24 mg/dL   Glucose, Bld 82 70 - 99 mg/dL    Comment: Glucose reference range applies only to samples taken after fasting for at least 8 hours.   Calcium, Ion 1.15 1.15 - 1.40 mmol/L   TCO2 27 22 - 32 mmol/L   Hemoglobin 15.3 13.0 - 17.0 g/dL   HCT 27.2 53.6 - 64.4 %  Troponin I (High Sensitivity)     Status: None   Collection Time: 12/19/22  4:54 PM  Result Value Ref Range   Troponin I (High Sensitivity) 5 <18 ng/L    Comment: (NOTE) Elevated high sensitivity troponin I (hsTnI) values and significant  changes across  serial measurements may suggest ACS but many other  chronic and acute conditions are known to elevate hsTnI results.  Refer to the "Links" section for chest pain algorithms and additional  guidance. Performed at [facility], [address], [place], [place] [address]    CT Angio Chest PE W and/or Wo Contrast  Result Date: 12/19/2022 CLINICAL DATA:  Pulmonary embolism (PE) suspected, high prob. Chest pain for 3 days. Difficulty breathing. EXAM: CT ANGIOGRAPHY CHEST WITH CONTRAST TECHNIQUE: Multidetector CT imaging of the chest was performed using the standard protocol during bolus administration of intravenous contrast. Multiplanar CT image reconstructions and MIPs were obtained to evaluate the vascular anatomy. RADIATION DOSE REDUCTION: This exam was performed according to the departmental dose-optimization program which includes automated exposure control, adjustment of the mA and/or kV according to patient size and/or use of iterative reconstruction technique. CONTRAST:  75mL OMNIPAQUE IOHEXOL 350 MG/ML SOLN COMPARISON:  CT scan chest from 07/12/2022. FINDINGS: Cardiovascular: There is small volume pulmonary embolism in the left lung. There is occlusive thrombus in the segmental pulmonary artery branch to inferior lingula. There is small volume nonocclusive thrombus in the left lung lower lobe lobar pulmonary artery branches with extension into the segmental pulmonary artery. No right heart strain. There is resultant lung infarction in the inferior lingula. Normal cardiac size. No pericardial effusion. No aortic aneurysm. Mediastinum/Nodes: Visualized thyroid gland appears grossly unremarkable. No solid / cystic mediastinal masses. The esophagus is nondistended precluding optimal assessment. There is mild circumferential thickening of the lower thoracic esophagus, which is most likely seen in the settings of chronic gastroesophageal reflux disease versus esophagitis. There are multiple enlarged  mediastinal and bilateral hilar lymph nodes, increased since the prior study. These are indeterminate in etiology but favored benign/reactive in the given clinical settings. No axillary lymphadenopathy by size criteria. Lungs/Pleura: The central tracheo-bronchial tree is patent. There is small-to-moderate left pleural effusion with associated compressive atelectatic changes in the left lung lower lobe. There is a lung infarction in the inferior lingula. There are patchy areas of atelectasis in the right lung lower lobe. No right pleural effusion. No mass or consolidation or pneumothorax. No suspicious lung nodule. Upper Abdomen: Visualized upper abdominal viscera within normal limits. Musculoskeletal: Bilateral small-to-moderate symmetric gynecomastia noted. The visualized soft tissues of the chest wall are grossly unremarkable. No suspicious osseous lesions. Subacute/healing right third through sixth rib fractures noted. Review of the MIP images confirms the above findings. IMPRESSION: 1. Small volume pulmonary embolism in the left lung with resultant lung infarction in the inferior lingula. No right heart strain. 2. Small-to-moderate left pleural effusion with associated compressive atelectatic changes in the left lung lower lobe. 3. Multiple enlarged mediastinal and bilateral hilar lymph nodes, increased since the prior study. These are indeterminate in etiology but favored benign/reactive in the given clinical settings. 4. Mild circumferential thickening of the lower thoracic esophagus, which is most likely seen in the settings of chronic gastroesophageal reflux disease versus esophagitis. 5. Multiple other nonacute observations, as described above. Critical Value/emergent results were called by telephone at the time of interpretation on 12/19/2022 at 5:01 pm to Dr. Rubin Payor, who verbally acknowledged these results. Electronically Signed   By: Jules Schick M.D.   On: 12/19/2022 17:05   DG Chest Portable 1  View  Result Date: 12/19/2022 CLINICAL DATA:  Chest pain for 3 days.  Difficulty breathing. EXAM: PORTABLE CHEST 1 VIEW COMPARISON:  07/12/2022. FINDINGS: There is left retrocardiac airspace opacity obscuring the left hemidiaphragm, descending thoracic aorta and blunting the left lateral costophrenic angle suggesting combination of left lower lobe atelectasis and/or consolidation with pleural effusion. Bilateral lung fields are clear. Right lateral costophrenic angle is clear. Evaluation  of cardiomediastinal silhouette is nondiagnostic due to left lower hemithorax opacification. No acute osseous abnormalities. The soft tissues are within normal limits. IMPRESSION: *Left lower lobe atelectasis and/or consolidation with associated left pleural effusion. Electronically Signed   By: Jules Schick M.D.   On: 12/19/2022 16:51    Pending Labs Unresulted Labs (From admission, onward)     Start     Ordered   12/20/22 0500  Heparin level (unfractionated)  Daily,   R     Placed in "And" Linked Group   12/19/22 1739   12/20/22 0500  CBC  Daily,   R     Placed in "And" Linked Group   12/19/22 1739   12/20/22 0000  Heparin level (unfractionated)  Once-Timed,   URGENT        12/19/22 1739   12/19/22 1841  Urinalysis, Complete w Microscopic -Urine, Clean Catch  Once,   R       Question Answer Comment  Release to patient Immediate   Specimen Source Urine, Clean Catch      12/19/22 1840   12/19/22 1835  Rapid urine drug screen (hospital performed)  ONCE - STAT,   STAT        12/19/22 1834   12/19/22 1831  Magnesium  Add-on,   AD        12/19/22 1830   12/19/22 1831  Phosphorus  Add-on,   AD        12/19/22 1830   12/19/22 1831  CK  Add-on,   AD        12/19/22 1830   12/19/22 1831  TSH  Add-on,   AD        12/19/22 1830   12/19/22 1831  Hepatic function panel  Add-on,   AD       Question:  Release to patient  Answer:  Immediate   12/19/22 1830   12/19/22 1819  SARS Coronavirus 2 by RT PCR  (hospital order, performed in [facility] hospital lab) *cepheid single result test* Anterior Nasal Swab  (SARS Coronavirus 2 by RT PCR (hospital order, performed in [facility] hospital lab) *cepheid single result test*)  Once,   URGENT        12/19/22 1818   Signed and Held  Magnesium  Tomorrow morning,   R        Signed and Held   Signed and Held  Phosphorus  Tomorrow morning,   R        Signed and Held   Signed and Held  Comprehensive metabolic panel  Tomorrow morning,   R       Question:  Release to patient  Answer:  Immediate   Signed and Held   Signed and Held  CBC  Tomorrow morning,   R       Question:  Release to patient  Answer:  Immediate   Signed and Held            Vitals/Pain Today's Vitals   12/19/22 1700 12/19/22 1734 12/19/22 1800 12/19/22 1900  BP: 102/62  119/78 125/82  Pulse: (!) 56  (!) 57 65  Resp: 14  12 16   Temp:      TempSrc:      SpO2: 97%  99% 100%  Weight:  185 lb (83.9 kg)    Height:  6' (1.829 m)    PainSc:        Isolation Precautions No active isolations  Medications Medications  nicotine (NICODERM CQ - dosed  in mg/24 hours) patch 21 mg (21 mg Transdermal Patch Applied 12/19/22 1731)  heparin ADULT infusion 100 units/mL (25000 units/241mL) (1,350 Units/hr Intravenous New Bag/Given 12/19/22 1802)  iohexol (OMNIPAQUE) 350 MG/ML injection 75 mL (75 mLs Intravenous Contrast Given 12/19/22 1636)  fentaNYL (SUBLIMAZE) injection 50 mcg (50 mcg Intravenous Given 12/19/22 1738)  heparin bolus via infusion 5,000 Units (5,000 Units Intravenous Bolus from Bag 12/19/22 1802)    Mobility walks     Focused Assessments Cardiac Assessment Handoff:  Cardiac Rhythm: Normal sinus rhythm Lab Results  Component Value Date   CKTOTAL 134 04/06/2010   TROPONINI <0.03 07/04/2014   Lab Results  Component Value Date   DDIMER <0.27 07/04/2014   Does the Patient currently have chest pain?  Pt's pain is intermittent    R Recommendations: See  Admitting Provider Note  Report given to:   Additional Notes:

## 2022-12-19 NOTE — Progress Notes (Signed)
PHARMACY - ANTICOAGULATION CONSULT NOTE  Pharmacy Consult for heparin  Indication: pulmonary embolus  No Known Allergies  Patient Measurements:   Heparin Dosing Weight: 83.9lbs   Vital Signs: Temp: 98.3 F (36.8 C) (10/10 1432) Temp Source: Oral (10/10 1432) BP: 102/62 (10/10 1700) Pulse Rate: 56 (10/10 1700)  Labs: Recent Labs    12/19/22 1509 12/19/22 1540  HGB 14.1 15.3  HCT 44.6 45.0  PLT 284  --   CREATININE 0.77 0.80  TROPONINIHS 6  --     CrCl cannot be calculated (Unknown ideal weight.).   Medical History: Past Medical History:  Diagnosis Date   Anxiety    Arthritis    Cervical stenosis of spinal canal    Depression    Fibromyalgia    Headache    Hernia    Bilateral Inguinal   Pancreatitis    Syncope    April 2016   Assessment: Patient admitted with CC of SOB. CTA showing PE with no evidence of RHS. HgB 15.3 and PLTS 284. Not on anticoagulation PTA.   Goal of Therapy:  Heparin level 0.3-0.7 units/ml Monitor platelets by anticoagulation protocol: Yes   Plan:  Give 5000 units bolus x 1 Start heparin infusion at 1350 units/hr Check anti-Xa level in 6 hours and daily while on heparin Continue to monitor H&H and platelets  Estill Batten, PharmD, BCCCP  12/19/2022,5:30 PM

## 2022-12-19 NOTE — Assessment & Plan Note (Signed)
-   Spoke about importance of quitting spent 5 minutes discussing options for treatment, prior attempts at quitting, and dangers of smoking ? -At this point patient is    interested in quitting ? - order nicotine patch  ? - nursing tobacco cessation protocol ? ?

## 2022-12-19 NOTE — Subjective & Objective (Signed)
Presented with CP radiating to the right for the past 3 days increased SOB  Heavy smoker No N/vD, no lightheadedness Has been on testosterone replacement

## 2022-12-19 NOTE — ED Notes (Signed)
TC to [facility] ED . Report given to Triage RN.

## 2022-12-19 NOTE — ED Notes (Signed)
PT's mother approached the nurses desk and stated that PT was ready to leave. This Clinical research associate made contact with PT and explained to him the seriousness of his condition and that he really needed to stay. PT stated "I just want to go home, I will follow up with my PCP" This writer informed PT that his PCP would probably also recommend he stay. PT refused to listen, removed his gown and BP cuff and stated he was leaving. This writer made Adela Glimpse, MD aware of same. PT's IV removed and PT left AMA.

## 2022-12-19 NOTE — Assessment & Plan Note (Signed)
Admit to   telemetry   Initiate heparin drip  Would likely benefit from case manager consult for long term anticoagulation Hold home blood pressure medications avoid hypotension Cycle cardiac enzymes Order echogram and lower extremity Dopplers  Most likely risk factors for hypercoagulable state being unknown  Would benefit from hypercoagulable workup as an outpatient

## 2022-12-19 NOTE — H&P (Signed)
Brent Libman Sleight Jr. ZOX:[id number] DOB: 1975/10/03 DOA: 12/19/2022     PCP: Floreen Comber, MD       Patient arrived to ER on 12/19/22 at 1431 Referred by Attending Therisa Doyne, MD   Patient coming from:    home Lives   With family    Chief Complaint:   Chief Complaint  Patient presents with   Chest Pain    HPI: Brent Attig. is a 47 y.o. male with medical history significant of Depression fibromyalgia, anxiety    Presented with   CP Presented with CP radiating to the right for the past 3 days increased SOB  Heavy smoker No N/vD, no lightheadedness Has been on testosterone replacement  No prior hx of blood clots  Reports on disability  Due to arthritis   Spends days not moving much  Today O2 sat was 88 % at the PCP office  Now 98% on RA Denies significant ETOH intake   Does  smoke  but interested in quitting   Lab Results  Component Value Date   SARSCOV2NAA NEGATIVE 07/13/2022   SARSCOV2NAA NEGATIVE 11/19/2020       Regarding pertinent Chronic problems:    HTN :   hydrochlorothiazide Cozaar              While in ER: Clinical Course as of 12/19/22 1834  Thu Dec 19, 2022  1727 CT Angio Chest PE W and/or Wo Contrast I visualized and interpreted CT angio of the chest which shows pulmonary embolus in the left lingula with associated infarction and effusion. [AH]    Clinical Course User Index [AH] Arthor Captain, PA-C       Lab Orders         SARS Coronavirus 2 by RT PCR (hospital order, performed in [facility] hospital lab) *cepheid single result test* Anterior Nasal Swab         Basic metabolic panel         CBC with Differential         Brain natriuretic peptide         Heparin level (unfractionated)         CBC         Heparin level (unfractionated)         Magnesium         Phosphorus         CK         TSH         Hepatic function panel         I-stat chem 8, ED (not at [facility], [facility] or [facility])       CXR - Left lower lobe  atelectasis and/or consolidation with associated left pleural effusion.   CTA chest -  PE, Small volume pulmonary embolism in the left lung with resultant lung infarction in the inferior lingula. No right heart strain.  Small-to-moderate left pleural effusion with associated compressive atelectatic changes in the left lung lower lobe. 3. Multiple enlarged mediastinal and bilateral hilar lymph nodes, increased since the prior study. These are indeterminate in etiology but favored benign/reactive in the given clinical settings. settings of chronic gastroesophageal reflux disease versus esophagitis. Following Medications were ordered in ER: Medications  nicotine (NICODERM CQ - dosed in mg/24 hours) patch 21 mg (21 mg Transdermal Patch Applied 12/19/22 1731)  heparin ADULT infusion 100 units/mL (25000 units/26mL) (1,350 Units/hr Intravenous New Bag/Given 12/19/22 1802)  iohexol (OMNIPAQUE) 350 MG/ML injection 75 mL (75 mLs  Intravenous Contrast Given 12/19/22 1636)  fentaNYL (SUBLIMAZE) injection 50 mcg (50 mcg Intravenous Given 12/19/22 1738)  heparin bolus via infusion 5,000 Units (5,000 Units Intravenous Bolus from Bag 12/19/22 1802)       ED Triage Vitals  Encounter Vitals Group     BP 12/19/22 1432 126/70     Systolic BP Percentile --      Diastolic BP Percentile --      Pulse Rate 12/19/22 1432 78     Resp 12/19/22 1432 20     Temp 12/19/22 1432 98.3 F (36.8 C)     Temp Source 12/19/22 1432 Oral     SpO2 12/19/22 1432 94 %     Weight 12/19/22 1734 185 lb (83.9 kg)     Height 12/19/22 1734 6' (1.829 m)     Head Circumference --      Peak Flow --      Pain Score 12/19/22 1434 8     Pain Loc --      Pain Education --      Exclude from Growth Chart --   ZOXW(96)@     _________________________________________ Significant initial  Findings: Abnormal Labs Reviewed  CBC WITH DIFFERENTIAL/PLATELET - Abnormal; Notable for the following components:      Result Value   Monocytes  Absolute 1.7 (*)    All other components within normal limits  BRAIN NATRIURETIC PEPTIDE - Abnormal; Notable for the following components:   B Natriuretic Peptide 162.4 (*)    All other components within normal limits    _________________________ Troponin   Cardiac Panel (last 3 results) Recent Labs    12/19/22 1509  TROPONINIHS 6     ECG: Ordered Personally reviewed and interpreted by me showing: HR : 77 Rhythm: Normal sinus rhythm Incomplete right bundle branch block Borderline ECG QTC 398  BNP (last 3 results) Recent Labs    12/19/22 1509  BNP 162.4*     COVID-19 Labs  No results for input(s): "DDIMER", "FERRITIN", "LDH", "CRP" in the last 72 hours.  Lab Results  Component Value Date   SARSCOV2NAA NEGATIVE 07/13/2022   SARSCOV2NAA NEGATIVE 11/19/2020    The recent clinical data is shown below. Vitals:   12/19/22 1434 12/19/22 1700 12/19/22 1734 12/19/22 1800  BP:  102/62  119/78  Pulse:  (!) 56  (!) 57  Resp:  14  12  Temp:      TempSrc:      SpO2: 93% 97%  99%  Weight:   83.9 kg   Height:   6' (1.829 m)     WBC     Component Value Date/Time   WBC 9.3 12/19/2022 1509   LYMPHSABS 1.7 12/19/2022 1509   MONOABS 1.7 (H) 12/19/2022 1509   EOSABS 0.3 12/19/2022 1509   BASOSABS 0.0 12/19/2022 1509     UA  ordered    Results for orders placed or performed during the hospital encounter of 07/12/22  Culture, blood (routine x 2)     Status: None   Collection Time: 07/12/22  6:50 PM   Specimen: BLOOD  Result Value Ref Range Status   Specimen Description BLOOD SITE NOT SPECIFIED  Final   Special Requests   Final    BOTTLES DRAWN AEROBIC AND ANAEROBIC Blood Culture results may not be optimal due to an inadequate volume of blood received in culture bottles   Culture   Final    NO GROWTH 5 DAYS Performed at [facility] Lab, [address] [place], [place] [address]    Report Status 07/17/2022 FINAL  Final  Culture, blood (routine x 2)     Status:  None   Collection Time: 07/12/22  7:25 PM   Specimen: BLOOD  Result Value Ref Range Status   Specimen Description BLOOD RIGHT ANTECUBITAL  Final   Special Requests   Final    BOTTLES DRAWN AEROBIC AND ANAEROBIC Blood Culture adequate volume   Culture   Final    NO GROWTH 5 DAYS Performed at [facility], [address], [place], [place] [address]    Report Status 07/17/2022 FINAL  Final  MRSA Next Gen by PCR, Nasal     Status: None   Collection Time: 07/13/22 12:24 AM   Specimen: Nasal Mucosa; Nasal Swab  Result Value Ref Range Status   MRSA by PCR Next Gen NOT DETECTED NOT DETECTED Final    Comment: (NOTE) The GeneXpert MRSA Assay (FDA approved for NASAL specimens only), is one component of a comprehensive MRSA colonization surveillance program. It is not intended to diagnose MRSA infection nor to guide or monitor treatment for MRSA infections. Test performance is not FDA approved in patients less than 69 years old. Performed at [facility] Lab, [address] [place], [place] [address]   SARS Coronavirus 2 by RT PCR (hospital order, performed in [facility] hospital lab) *cepheid single result test*     Status: None   Collection Time: 07/13/22  1:12 AM  Result Value Ref Range Status   SARS Coronavirus 2 by RT PCR NEGATIVE NEGATIVE Final    Comment: Performed at [facility] Lab, [address], [place], [place] [address]    __________________________________________________________ Recent Labs  Lab 12/19/22 1509 12/19/22 1540  NA 140 140  K 3.7 3.6  CO2 26  --   GLUCOSE 78 82  BUN 8 8  CREATININE 0.77 0.80  CALCIUM 9.0  --     Cr  stable,   Lab Results  Component Value Date   CREATININE 0.80 12/19/2022   CREATININE 0.77 12/19/2022   CREATININE 0.83 07/17/2022    No results for input(s): "AST", "ALT", "ALKPHOS", "BILITOT", "PROT", "ALBUMIN" in the last 168 hours. Lab Results  Component Value Date   CALCIUM 9.0 12/19/2022   PHOS 3.0 07/14/2022     Plt: Lab Results  Component Value Date   PLT 284 12/19/2022       Recent Labs  Lab 12/19/22 1509 12/19/22 1540  WBC 9.3  --   NEUTROABS 5.6  --   HGB 14.1 15.3  HCT 44.6 45.0  MCV 87.3  --   PLT 284  --     HG/HCT  stable,     Component Value Date/Time   HGB 15.3 12/19/2022 1540   HCT 45.0 12/19/2022 1540   MCV 87.3 12/19/2022 1509   MCV 89.2 04/03/2015 1738    _______________________________________________ Hospitalist was called for admission for   Acute pulmonary embolism without acute cor pulmonale, unspecified pulmonary embolism type (HCC)  Infarction of lung due to iatrogenic pulmonary embolism (HCC)    Pleural effusion     The following Work up has been ordered so far:  Orders Placed This Encounter  Procedures   SARS Coronavirus 2 by RT PCR (hospital order, performed in [facility] hospital lab) *cepheid single result test* Anterior Nasal Swab   CT Angio Chest PE W and/or Wo Contrast   DG Chest Portable 1 View   Basic metabolic panel  CBC with Differential   Brain natriuretic peptide   Heparin level (unfractionated)   CBC   Heparin level (unfractionated)   Magnesium   Phosphorus   CK   TSH   Hepatic function panel   ED Cardiac monitoring   Cardiac Monitoring Continuous x 48 hours Indications for use: Other; Other indications for use: pulmonary embolus   heparin per pharmacy consult   Consult for [facility] Admission   I-stat chem 8, ED (not at [facility], [facility] or [facility])   EKG 12-Lead   Insert peripheral IV   Place in observation (patient's expected length of stay will be less than 2 midnights)     OTHER Significant initial  Findings:  labs showing:     DM  labs:  HbA1C: No results for input(s): "HGBA1C" in the last 8760 hours.     CBG (last 3)  No results for input(s): "GLUCAP" in the last 72 hours.        Cultures:    Component Value Date/Time   SDES BLOOD RIGHT ANTECUBITAL 07/12/2022 1925   SPECREQUEST  07/12/2022 1925     BOTTLES DRAWN AEROBIC AND ANAEROBIC Blood Culture adequate volume   CULT  07/12/2022 1925    NO GROWTH 5 DAYS Performed at [facility], [address], [place], [place] [address]    REPTSTATUS 07/17/2022 FINAL 07/12/2022 1925     Radiological Exams on Admission: CT Angio Chest PE W and/or Wo Contrast  Result Date: 12/19/2022 CLINICAL DATA:  Pulmonary embolism (PE) suspected, high prob. Chest pain for 3 days. Difficulty breathing. EXAM: CT ANGIOGRAPHY CHEST WITH CONTRAST TECHNIQUE: Multidetector CT imaging of the chest was performed using the standard protocol during bolus administration of intravenous contrast. Multiplanar CT image reconstructions and MIPs were obtained to evaluate the vascular anatomy. RADIATION DOSE REDUCTION: This exam was performed according to the departmental dose-optimization program which includes automated exposure control, adjustment of the mA and/or kV according to patient size and/or use of iterative reconstruction technique. CONTRAST:  75mL OMNIPAQUE IOHEXOL 350 MG/ML SOLN COMPARISON:  CT scan chest from 07/12/2022. FINDINGS: Cardiovascular: There is small volume pulmonary embolism in the left lung. There is occlusive thrombus in the segmental pulmonary artery branch to inferior lingula. There is small volume nonocclusive thrombus in the left lung lower lobe lobar pulmonary artery branches with extension into the segmental pulmonary artery. No right heart strain. There is resultant lung infarction in the inferior lingula. Normal cardiac size. No pericardial effusion. No aortic aneurysm. Mediastinum/Nodes: Visualized thyroid gland appears grossly unremarkable. No solid / cystic mediastinal masses. The esophagus is nondistended precluding optimal assessment. There is mild circumferential thickening of the lower thoracic esophagus, which is most likely seen in the settings of chronic gastroesophageal reflux disease versus esophagitis. There are multiple enlarged  mediastinal and bilateral hilar lymph nodes, increased since the prior study. These are indeterminate in etiology but favored benign/reactive in the given clinical settings. No axillary lymphadenopathy by size criteria. Lungs/Pleura: The central tracheo-bronchial tree is patent. There is small-to-moderate left pleural effusion with associated compressive atelectatic changes in the left lung lower lobe. There is a lung infarction in the inferior lingula. There are patchy areas of atelectasis in the right lung lower lobe. No right pleural effusion. No mass or consolidation or pneumothorax. No suspicious lung nodule. Upper Abdomen: Visualized upper abdominal viscera within normal limits. Musculoskeletal: Bilateral small-to-moderate symmetric gynecomastia noted. The visualized soft tissues of the chest wall are grossly unremarkable. No suspicious osseous lesions. Subacute/healing right third  through sixth rib fractures noted. Review of the MIP images confirms the above findings. IMPRESSION: 1. Small volume pulmonary embolism in the left lung with resultant lung infarction in the inferior lingula. No right heart strain. 2. Small-to-moderate left pleural effusion with associated compressive atelectatic changes in the left lung lower lobe. 3. Multiple enlarged mediastinal and bilateral hilar lymph nodes, increased since the prior study. These are indeterminate in etiology but favored benign/reactive in the given clinical settings. 4. Mild circumferential thickening of the lower thoracic esophagus, which is most likely seen in the settings of chronic gastroesophageal reflux disease versus esophagitis. 5. Multiple other nonacute observations, as described above. Critical Value/emergent results were called by telephone at the time of interpretation on 12/19/2022 at 5:01 pm to Dr. Rubin Payor, who verbally acknowledged these results. Electronically Signed   By: Jules Schick M.D.   On: 12/19/2022 17:05   DG Chest Portable 1  View  Result Date: 12/19/2022 CLINICAL DATA:  Chest pain for 3 days.  Difficulty breathing. EXAM: PORTABLE CHEST 1 VIEW COMPARISON:  07/12/2022. FINDINGS: There is left retrocardiac airspace opacity obscuring the left hemidiaphragm, descending thoracic aorta and blunting the left lateral costophrenic angle suggesting combination of left lower lobe atelectasis and/or consolidation with pleural effusion. Bilateral lung fields are clear. Right lateral costophrenic angle is clear. Evaluation of cardiomediastinal silhouette is nondiagnostic due to left lower hemithorax opacification. No acute osseous abnormalities. The soft tissues are within normal limits. IMPRESSION: *Left lower lobe atelectasis and/or consolidation with associated left pleural effusion. Electronically Signed   By: Jules Schick M.D.   On: 12/19/2022 16:51   _______________________________________________________________________________________________________ Latest  Blood pressure 119/78, pulse (!) 57, temperature 98.3 F (36.8 C), temperature source Oral, resp. rate 12, height 6' (1.829 m), weight 83.9 kg, SpO2 99%.   Vitals  labs and radiology finding personally reviewed  Review of Systems:    Pertinent positives include:  chest pain,  Constitutional:  No weight loss, night sweats, Fevers, chills, fatigue, weight loss  HEENT:  No headaches, Difficulty swallowing,Tooth/dental problems,Sore throat,  No sneezing, itching, ear ache, nasal congestion, post nasal drip,  Cardio-vascular:  No  Orthopnea, PND, anasarca, dizziness, palpitations.no Bilateral lower extremity swelling  GI:  No heartburn, indigestion, abdominal pain, nausea, vomiting, diarrhea, change in bowel habits, loss of appetite, melena, blood in stool, hematemesis Resp:  no shortness of breath at rest. No dyspnea on exertion, No excess mucus, no productive cough, No non-productive cough, No coughing up of blood.No change in color of mucus.No wheezing. Skin:  no  rash or lesions. No jaundice GU:  no dysuria, change in color of urine, no urgency or frequency. No straining to urinate.  No flank pain.  Musculoskeletal:  No joint pain or no joint swelling. No decreased range of motion. No back pain.  Psych:  No change in mood or affect. No depression or anxiety. No memory loss.  Neuro: no localizing neurological complaints, no tingling, no weakness, no double vision, no gait abnormality, no slurred speech, no confusion  All systems reviewed and apart from HOPI all are negative _______________________________________________________________________________________________ Past Medical History:   Past Medical History:  Diagnosis Date   Anxiety    Arthritis    Cervical stenosis of spinal canal    Depression    Fibromyalgia    Headache    Hernia    Bilateral Inguinal   Pancreatitis    Syncope    April 2016      Past Surgical History:  Procedure Laterality Date  INGUINAL HERNIA REPAIR     MULTIPLE EXTRACTIONS WITH ALVEOLOPLASTY N/A 10/13/2014   Procedure: Extraction of tooth #'s 2,4,5,6,7,8,9,10,11,12,13,14,15, 96,04,54,09,81,19,14,78,29,56 with alveoloplasty and bilateral mandibular tori reductions;  Surgeon: Charlynne Pander, DDS;  Location: [facility] OR;  Service: Oral Surgery;  Laterality: N/A;  NASAL TUBE   thumb surgery      Social History:  Ambulatory   independently      reports that he has been smoking cigarettes. He started smoking about 30 years ago. He has a 77 pack-year smoking history. He has never used smokeless tobacco. He reports that he does not drink alcohol and does not use drugs.     Family History:  Family History  Problem Relation Age of Onset   Hypertension Mother    Heart disease Father    Crohn's disease Maternal Aunt    Colon cancer Paternal Grandfather    ______________________________________________________________________________________________ Allergies: No Known Allergies   Prior to Admission  medications   Medication Sig Start Date End Date Taking? Authorizing Provider  losartan (COZAAR) 100 MG tablet Take 1 tablet by mouth daily. 11/27/22  Yes [provider]  losartan (COZAAR) 50 MG tablet Take 50 mg by mouth daily. 11/08/22  Yes [provider]  sertraline (ZOLOFT) 50 MG tablet Take 50 mg by mouth daily. 10/10/22  Yes [provider]  gabapentin (NEURONTIN) 300 MG capsule Take 2 capsules (600 mg total) by mouth 3 (three) times daily. 07/17/22 12/19/22  Almon Hercules, MD  hydrochlorothiazide (HYDRODIURIL) 25 MG tablet Take 25 mg by mouth daily.    [provider]  hydrOXYzine (ATARAX/VISTARIL) 25 MG tablet Take 25 mg by mouth at bedtime. 11/15/20   [provider]  mirtazapine (REMERON SOL-TAB) 30 MG disintegrating tablet Take 30 mg by mouth at bedtime. 02/28/20   [provider]  omeprazole (PRILOSEC) 40 MG capsule Take 1 capsule (40 mg total) by mouth in the morning. About 30 minutes before breakfast 07/17/22 10/15/22  Almon Hercules, MD  ondansetron (ZOFRAN) 4 MG tablet Take 1 tablet (4 mg total) by mouth every 4 (four) hours as needed for nausea. 11/22/20   Leatha Gilding, MD  oxyCODONE (ROXICODONE) 15 MG immediate release tablet Take 15 mg by mouth 2 (two) times daily. 10/30/20   [provider]  potassium chloride (KLOR-CON) 10 MEQ tablet Take 1 tablet (10 mEq total) by mouth daily. 08/22/22   Barbette Merino, NP  QUEtiapine (SEROQUEL) 100 MG tablet Take 100 mg by mouth at bedtime. 05/16/22   [provider]  testosterone cypionate (DEPOTESTOSTERONE CYPIONATE) 200 MG/ML injection Inject 100 mg into the muscle once a week.    [provider]  torsemide (DEMADEX) 20 MG tablet Take 1 tablet (20 mg total) by mouth daily for 5 days. 08/22/22 08/27/22  Barbette Merino, NP    ___________________________________________________________________________________________________ Physical Exam:    12/19/2022    6:00 PM  12/19/2022    5:34 PM 12/19/2022    5:00 PM  Vitals with BMI  Height  6\' 0"    Weight  185 lbs   BMI  25.08   Systolic 119  102  Diastolic 78  62  Pulse 57  56    1. General:  in No  Acute distress   Chronically ill   -appearing 2. Psychological: Alert and   Oriented 3. Head/ENT:  Dry Mucous Membranes  Head Non traumatic, neck supple                           Poor Dentition 4. SKIN:  decreased Skin turgor,  Skin clean Dry and intact no rash    5. Heart: Regular rate and rhythm no  Murmur, no Rub or gallop 6. Lungs: , no wheezes mild  crackles   7. Abdomen: Soft,  non-tender, Non distended  bowel sounds present 8. Lower extremities: no clubbing, cyanosis,  trace edema 9. Neurologically Grossly intact, moving all 4 extremities equally  10. MSK: Normal range of motion    Chart has been reviewed  ______________________________________________________________________________________________  Assessment/Plan  47 y.o. male with medical history significant of Depression fibromyalgia, anxiety  Admitted for PE, Infarction of lung, Pleural effusion    Present on Admission:  Pulmonary embolism without acute cor pulmonale (HCC)  TOBACCO USE  Mood disorder (HCC)  Pleural effusion   Pulmonary embolism without acute cor pulmonale (HCC)  Admit to   telemetry   Initiate heparin drip  Would likely benefit from case manager consult for long term anticoagulation Hold home blood pressure medications avoid hypotension Cycle cardiac enzymes Order echogram and lower extremity Dopplers  Most likely risk factors for hypercoagulable state being unknown  Would benefit from hypercoagulable workup as an outpatient     TOBACCO USE  - Spoke about importance of quitting spent 5 minutes discussing options for treatment, prior attempts at quitting, and dangers of smoking  -At this point patient is    interested in quitting  - order nicotine patch   - nursing tobacco  cessation protocol   Mood disorder (HCC) Continue home meds   Other plan as per orders.  DVT prophylaxis: heparin    Code Status:    Code Status: Prior FULL CODE  as per patient   I had personally discussed CODE STATUS with patient and family  ACP   none    Family Communication:   Family  at  Bedside  plan of care was discussed   with  mother  Diet heart healthy   Disposition Plan:       To home once workup is complete and patient is stable   Following barriers for discharge:                            PE work up is complete                             Pain controlled with PO medications                            Consults called: none     Admission status:  ED Disposition     ED Disposition  Admit   Condition  --   Comment  Hospital Area: [facility] [[id number]]  Level of Care: Telemetry Medical [[id number]]  May place patient in observation at [facility] or [facility] if equivalent level of care is available:: No  Covid Evaluation: Asymptomatic - no recent exposure (last 10 days) testing not required  Diagnosis: Pulmonary embolism without acute cor pulmonale [facility]) [[id number]]  Admitting Physician: Therisa Doyne [[id number]]  Attending Physician: Therisa Doyne [[id number]]          Obs     Level of care  tele  For  24H     COVID-19 Labs    Therisa Doyne 12/19/2022, 7:41 PM    [facility]     after 2 AM please page floor coverage PA If 7AM-7PM, please contact the day team taking care of the patient using [email or web address]

## 2022-12-19 NOTE — Assessment & Plan Note (Signed)
-   Continue home meds °
